# Patient Record
Sex: Male | Born: 1970 | Race: Black or African American | Hispanic: No | Marital: Single | Smoking: Current every day smoker
Health system: Southern US, Community
[De-identification: ages and names within clinical notes are randomized; demographics above are authoritative.]

## PROBLEM LIST (undated history)

## (undated) DIAGNOSIS — N189 Chronic kidney disease, unspecified: Secondary | ICD-10-CM

## (undated) DIAGNOSIS — E119 Type 2 diabetes mellitus without complications: Secondary | ICD-10-CM

## (undated) DIAGNOSIS — G709 Myoneural disorder, unspecified: Secondary | ICD-10-CM

## (undated) DIAGNOSIS — F209 Schizophrenia, unspecified: Secondary | ICD-10-CM

---

## 2019-12-26 ENCOUNTER — Ambulatory Visit: Payer: Self-pay | Attending: Critical Care Medicine

## 2019-12-26 DIAGNOSIS — Z23 Encounter for immunization: Secondary | ICD-10-CM

## 2019-12-26 NOTE — Progress Notes (Signed)
   Covid-19 Vaccination Clinic  Name:  Daniel Foley    MRN: [id number] DOB: [birth year]  12/26/2019  [name] was observed post Covid-19 immunization for 15 minutes without incident. He was provided with Vaccine Information Sheet and instruction to access the V-Safe system.   [name] was instructed to call 911 with any severe reactions post vaccine: Marland Kitchen Difficulty breathing  . Swelling of face and throat  . A fast heartbeat  . A bad rash all over body  . Dizziness and weakness   Immunizations Administered    Name Date Dose VIS Date Route   Moderna COVID-19 Vaccine 12/26/2019 11:02 AM 0.5 mL 10/26/2019 Intramuscular   Manufacturer: Moderna   Lot: [id number]   NDC: 17915-056-97

## 2020-01-26 ENCOUNTER — Ambulatory Visit: Payer: Self-pay | Attending: Internal Medicine

## 2020-01-26 DIAGNOSIS — Z23 Encounter for immunization: Secondary | ICD-10-CM

## 2020-01-26 NOTE — Progress Notes (Signed)
   Covid-19 Vaccination Clinic  Name:  Dvid Pendry    MRN: [id number] DOB: 05-05-1970  01/26/2020  Mr. Coston was observed post Covid-19 immunization for 15 minutes without incident. He was provided with Vaccine Information Sheet and instruction to access the V-Safe system.   Mr. Alen was instructed to call 911 with any severe reactions post vaccine: Marland Kitchen Difficulty breathing  . Swelling of face and throat  . A fast heartbeat  . A bad rash all over body  . Dizziness and weakness   Immunizations Administered    Name Date Dose VIS Date Route   Moderna COVID-19 Vaccine 01/26/2020 10:56 AM 0.5 mL 10/26/2019 Intramuscular   Manufacturer: Moderna   Lot: [id number]   NDC: 87681-157-26

## 2020-06-21 ENCOUNTER — Inpatient Hospital Stay (HOSPITAL_COMMUNITY)
Admission: EM | Admit: 2020-06-21 | Discharge: 2020-06-24 | DRG: 101 | Disposition: A | Discharge status: Home / Self Care | Payer: Medicaid Other | Attending: Internal Medicine | Admitting: Internal Medicine

## 2020-06-21 ENCOUNTER — Emergency Department (HOSPITAL_COMMUNITY): Payer: Medicaid Other

## 2020-06-21 ENCOUNTER — Encounter (HOSPITAL_COMMUNITY): Payer: Self-pay | Admitting: Neurology

## 2020-06-21 DIAGNOSIS — Z9114 Patient's other noncompliance with medication regimen: Secondary | ICD-10-CM

## 2020-06-21 DIAGNOSIS — R471 Dysarthria and anarthria: Secondary | ICD-10-CM | POA: Diagnosis present

## 2020-06-21 DIAGNOSIS — Z79899 Other long term (current) drug therapy: Secondary | ICD-10-CM

## 2020-06-21 DIAGNOSIS — F17210 Nicotine dependence, cigarettes, uncomplicated: Secondary | ICD-10-CM | POA: Diagnosis present

## 2020-06-21 DIAGNOSIS — Z91041 Radiographic dye allergy status: Secondary | ICD-10-CM

## 2020-06-21 DIAGNOSIS — F209 Schizophrenia, unspecified: Secondary | ICD-10-CM | POA: Diagnosis present

## 2020-06-21 DIAGNOSIS — R001 Bradycardia, unspecified: Secondary | ICD-10-CM | POA: Diagnosis present

## 2020-06-21 DIAGNOSIS — I10 Essential (primary) hypertension: Secondary | ICD-10-CM | POA: Diagnosis present

## 2020-06-21 DIAGNOSIS — R569 Unspecified convulsions: Secondary | ICD-10-CM | POA: Diagnosis not present

## 2020-06-21 DIAGNOSIS — Z7951 Long term (current) use of inhaled steroids: Secondary | ICD-10-CM

## 2020-06-21 DIAGNOSIS — Z20822 Contact with and (suspected) exposure to covid-19: Secondary | ICD-10-CM | POA: Diagnosis present

## 2020-06-21 DIAGNOSIS — E119 Type 2 diabetes mellitus without complications: Secondary | ICD-10-CM | POA: Diagnosis present

## 2020-06-21 DIAGNOSIS — R251 Tremor, unspecified: Secondary | ICD-10-CM | POA: Diagnosis present

## 2020-06-21 HISTORY — DX: Unspecified convulsions: R56.9

## 2020-06-21 HISTORY — DX: Schizophrenia, unspecified: F20.9

## 2020-06-21 LAB — CBG MONITORING, ED: Glucose-Capillary: 99 mg/dL (ref 70–99)

## 2020-06-21 LAB — CBC WITH DIFFERENTIAL/PLATELET
Abs Immature Granulocytes: 0.02 10*3/uL (ref 0.00–0.07)
Basophils Absolute: 0 10*3/uL (ref 0.0–0.1)
Basophils Relative: 0 %
Eosinophils Absolute: 0.1 10*3/uL (ref 0.0–0.5)
Eosinophils Relative: 1 %
HCT: 43.2 % (ref 39.0–52.0)
Hemoglobin: 14.1 g/dL (ref 13.0–17.0)
Immature Granulocytes: 0 %
Lymphocytes Relative: 51 %
Lymphs Abs: 3.9 10*3/uL (ref 0.7–4.0)
MCH: 29.7 pg (ref 26.0–34.0)
MCHC: 32.6 g/dL (ref 30.0–36.0)
MCV: 90.9 fL (ref 80.0–100.0)
Monocytes Absolute: 0.4 10*3/uL (ref 0.1–1.0)
Monocytes Relative: 6 %
Neutro Abs: 3.2 10*3/uL (ref 1.7–7.7)
Neutrophils Relative %: 42 %
Platelets: 209 10*3/uL (ref 150–400)
RBC: 4.75 MIL/uL (ref 4.22–5.81)
RDW: 13.6 % (ref 11.5–15.5)
WBC: 7.6 10*3/uL (ref 4.0–10.5)
nRBC: 0 % (ref 0.0–0.2)

## 2020-06-21 LAB — RAPID URINE DRUG SCREEN, HOSP PERFORMED
Amphetamines: NOT DETECTED
Barbiturates: NOT DETECTED
Benzodiazepines: NOT DETECTED
Cocaine: NOT DETECTED
Opiates: NOT DETECTED
Tetrahydrocannabinol: NOT DETECTED

## 2020-06-21 LAB — COMPREHENSIVE METABOLIC PANEL
ALT: 7 U/L (ref 0–44)
AST: 13 U/L — ABNORMAL LOW (ref 15–41)
Albumin: 3.5 g/dL (ref 3.5–5.0)
Alkaline Phosphatase: 45 U/L (ref 38–126)
Anion gap: 5 (ref 5–15)
BUN: 9 mg/dL (ref 6–20)
CO2: 30 mmol/L (ref 22–32)
Calcium: 9.5 mg/dL (ref 8.9–10.3)
Chloride: 104 mmol/L (ref 98–111)
Creatinine, Ser: 1.18 mg/dL (ref 0.61–1.24)
GFR, Estimated: >60 mL/min (ref >60)
Glucose, Bld: 80 mg/dL (ref 70–99)
Potassium: 4.3 mmol/L (ref 3.5–5.1)
Sodium: 139 mmol/L (ref 135–145)
Total Bilirubin: 0.5 mg/dL (ref 0.3–1.2)
Total Protein: 7.4 g/dL (ref 6.5–8.1)

## 2020-06-21 LAB — ETHANOL: Alcohol, Ethyl (B): <10 mg/dL (ref <10)

## 2020-06-21 MED ORDER — LORAZEPAM 2 MG/ML IJ SOLN: 2.0000 mg | Freq: Four times a day (QID) | INTRAMUSCULAR | Status: DC | PRN

## 2020-06-21 MED ORDER — GADOBUTROL 1 MMOL/ML IV SOLN
8.0000 mL | Freq: Once | INTRAVENOUS | Status: AC | PRN
  Administered 2020-06-21: 8 mL via INTRAVENOUS

## 2020-06-21 MED ORDER — MELATONIN 3 MG PO TABS
3.0000 mg | ORAL_TABLET | Freq: Every evening | ORAL | Status: DC | PRN
  Administered 2020-06-22 – 2020-06-23 (×2): 3 mg via ORAL
  Filled 2020-06-21 (×2): qty 1

## 2020-06-21 MED ORDER — ENOXAPARIN SODIUM 40 MG/0.4ML IJ SOSY
40.0000 mg | PREFILLED_SYRINGE | INTRAMUSCULAR | Status: DC
  Administered 2020-06-21 – 2020-06-23 (×3): 40 mg via SUBCUTANEOUS
  Filled 2020-06-21 (×3): qty 0.4

## 2020-06-21 MED ORDER — LACTATED RINGERS IV SOLN: INTRAVENOUS | Status: AC

## 2020-06-21 MED ORDER — ACETAMINOPHEN 325 MG PO TABS: 650.0000 mg | ORAL_TABLET | Freq: Four times a day (QID) | ORAL | Status: DC | PRN

## 2020-06-21 MED ORDER — DIPHENHYDRAMINE HCL 50 MG/ML IJ SOLN
50.0000 mg | Freq: Once | INTRAMUSCULAR | Status: AC
  Administered 2020-06-21: 50 mg via INTRAVENOUS
  Filled 2020-06-21: qty 1

## 2020-06-21 MED ORDER — POLYETHYLENE GLYCOL 3350 17 G PO PACK: 17.0000 g | PACK | Freq: Every day | ORAL | Status: DC | PRN

## 2020-06-21 MED ORDER — METHYLPREDNISOLONE SODIUM SUCC 125 MG IJ SOLR
125.0000 mg | Freq: Once | INTRAMUSCULAR | Status: AC
  Administered 2020-06-21: 125 mg via INTRAVENOUS
  Filled 2020-06-21: qty 2

## 2020-06-21 MED ORDER — ONDANSETRON HCL 4 MG/2ML IJ SOLN: 4.0000 mg | Freq: Four times a day (QID) | INTRAMUSCULAR | Status: DC | PRN

## 2020-06-21 MED ORDER — HYDRALAZINE HCL 20 MG/ML IJ SOLN: 5.0000 mg | Freq: Four times a day (QID) | INTRAMUSCULAR | Status: DC | PRN

## 2020-06-21 NOTE — ED Notes (Signed)
Viviann Spare (Supervisor carehome) [phone] Pt is a resident of [facility] group home.

## 2020-06-21 NOTE — ED Notes (Signed)
Patient transported to MRI 

## 2020-06-21 NOTE — ED Provider Notes (Signed)
[facility] EMERGENCY DEPARTMENT Provider Note   CSN: [id number] Arrival date & time: 06/21/20  1341     History Chief Complaint  Patient presents with   Seizures    Kagen Kunath is a 50 y.o. male.  HPI  50 year old male presents the emergency department today for evaluation of seizure-like activity.  Level 5 caveat as patient is unable to provide any reliable history about the potential seizure like activity. He does not remember this episode. He denies any complaints at this time. EMS reports that patient had a 1 to 2-minute episode of generalized shaking at his group home that was witnessed.  He was little bit uncooperative in route.  2:31 PM spoke with Harvie Heck who was present with the patient at the group home.  He states that the patient was outside smoking out of a pipe which is not normal for him.  Normally he smokes.  Rolled cigarettes.  He came back inside, laid down on the couch and then he was told that the patient was seizing.  He witnessed the patient having generalized shaking and that his eyes rolled in the back of his head.  He was also biting his tongue.  This lasted for about 1 to 2 minutes and upon resolution of symptoms the patient was confused for about 20 minutes.  He states that he has no known history of seizures and does not think the patient takes medication for seizures.  He states that at baseline the patient is oriented to self but not necessarily oriented to date, place or situation.  Past Medical History:  Diagnosis Date   Schizophrenia [facility])     Patient Active Problem List   Diagnosis Date Noted   Witnessed seizure-like activity (HCC) 06/21/2020     History reviewed. No pertinent surgical history.     History reviewed. No pertinent family history.  Social History   Tobacco Use   Smoking status: Every Day    Pack years: 0.00    Types: Cigarettes   Smokeless tobacco: Never  Substance Use Topics   Alcohol use: Not Currently    Drug use: Not Currently    Home Medications Prior to Admission medications   Not on File    Allergies    Patient has no allergy information on record.  Review of Systems   Review of Systems  Constitutional:  Negative for chills and fever.  HENT:  Negative for ear pain and sore throat.   Eyes:  Negative for pain and visual disturbance.  Respiratory:  Negative for cough and shortness of breath.   Cardiovascular:  Negative for chest pain.  Gastrointestinal:  Negative for abdominal pain, constipation, diarrhea, nausea and vomiting.  Genitourinary:  Negative for dysuria and hematuria.  Musculoskeletal:  Negative for back pain.  Skin:  Negative for color change and rash.  Neurological:  Positive for seizures. Negative for syncope.  All other systems reviewed and are negative.  Physical Exam Updated Vital Signs BP (!) 142/102   Pulse (!) 49   Temp 98.1 F (36.7 C)   Resp 15   SpO2 98%   Physical Exam Vitals and nursing note reviewed.  Constitutional:      Appearance: He is well-developed.  HENT:     Head: Normocephalic and atraumatic.  Eyes:     Conjunctiva/sclera: Conjunctivae normal.  Cardiovascular:     Rate and Rhythm: Normal rate and regular rhythm.     Heart sounds: Normal heart sounds. No murmur heard. Pulmonary:  Effort: Pulmonary effort is normal. No respiratory distress.     Breath sounds: Normal breath sounds. No wheezing, rhonchi or rales.  Abdominal:     General: Bowel sounds are normal.     Palpations: Abdomen is soft.     Tenderness: There is no abdominal tenderness. There is no guarding or rebound.  Musculoskeletal:     Cervical back: Neck supple.  Skin:    General: Skin is warm and dry.  Neurological:     Mental Status: He is alert.     Comments: Mental Status:  Alert, oriented to self. Not oriented to place, situation. Able to follow 2 step commands without difficulty.  Cranial Nerves:  II:  Peripheral visual fields grossly normal, pupils  equal, round, reactive to light III,IV, VI: ptosis not present, extra-ocular motions intact bilaterally  V,VII: smile symmetric, facial light touch sensation equal VIII: hearing grossly normal to voice  X: uvula elevates symmetrically  XI: bilateral shoulder shrug symmetric and strong XII: midline tongue extension without fassiculations Motor:  Normal tone. 5/5 strength of BUE and BLE major muscle groups including strong and equal grip strength and dorsiflexion/plantar flexion Sensory: light touch normal in all extremities.      ED Results / Procedures / Treatments   Labs (all labs ordered are listed, but only abnormal results are displayed) Labs Reviewed  COMPREHENSIVE METABOLIC PANEL - Abnormal; Notable for the following components:      Result Value   AST 13 (*)    All other components within normal limits  RESP PANEL BY RT-PCR (FLU A&B, COVID) ARPGX2  CBC WITH DIFFERENTIAL/PLATELET  RAPID URINE DRUG SCREEN, HOSP PERFORMED  ETHANOL  COMPREHENSIVE METABOLIC PANEL  MAGNESIUM  CBG MONITORING, ED    EKG None  Radiology CT Head Wo Contrast  Result Date: 06/21/2020 CLINICAL DATA:  Seizures EXAM: CT HEAD WITHOUT CONTRAST TECHNIQUE: Contiguous axial images were obtained from the base of the skull through the vertex without intravenous contrast. COMPARISON:  None. FINDINGS: Brain: Normal anatomic configuration. Parenchymal volume loss is slightly advanced given the patient's age. Mild periventricular white matter changes are present likely reflecting the sequela of small vessel ischemia. No abnormal intra or extra-axial mass lesion or fluid collection. No abnormal mass effect or midline shift. No evidence of acute intracranial hemorrhage or infarct. Ventricular size is normal. Cerebellum unremarkable. Vascular: No asymmetric hyperdense vasculature at the skull base. Skull: Intact Sinuses/Orbits: Paranasal sinuses are clear. Orbits are unremarkable. Other: Mastoid air cells and middle  ear cavities are clear. IMPRESSION: No acute intracranial abnormality. Mild parenchymal atrophy, advanced in relation to the patient's given age. Electronically Signed   By: Helyn Numbers MD   On: 06/21/2020 15:21    Procedures Procedures   Medications Ordered in ED Medications - No data to display  ED Course  I have reviewed the triage vital signs and the nursing notes.  Pertinent labs & imaging results that were available during my care of the patient were reviewed by me and considered in my medical decision making (see chart for details).    MDM Rules/Calculators/A&P                          50 year old male presenting the emergency department today for evaluation of seizure-like to video.  No reported injuries.  No history of seizures.  Reviewed/interpreted labs CBG wnl CBC unremarkable CMP unremarkable Mag - pending on admission UDS neg ETOH neg  EKG with NSR, LVH  CT  head - no acute intracranial abnormality. Mild parenchymal atrophy, advanced in relation to the patient's given age.  7:17 PM CONSULT with Dr. Thomasena Edis of neurology who recommends mri w/ w/o contrast and admit for eeg for further w/u of new onset seizure  7:53 PM CONSULT with Dr. Margo Aye who accepts patient for admission   Final Clinical Impression(s) / ED Diagnoses Final diagnoses:  Seizure-like activity [facility])    Rx / DC Orders ED Discharge Orders     None        Rayne Du 06/21/20 1953    Koleen Distance, MD 06/22/20 [phone]

## 2020-06-21 NOTE — ED Triage Notes (Signed)
Pt to ED via EMS [facility] c/o seizures, Pt has no history of seizures, witnessed "seizure like activity" that lasted aprox 2 mins. Pt has no memory of event. This occurred after reportedly smoking tobacco products. HX DM, schizophrenia. Group home staff Harvie Heck: [phone] Last VS: 129/85, p 92, 96%RA, CBG 126. No medications given by EMS. Pt refused to allow EMS to obtain IV.

## 2020-06-21 NOTE — ED Notes (Signed)
Patient returns from MRI- tech advised that patient appears to have rash on face. Upon further inspection, patient is having an allergic reaction to contrast. Benadryl and solumedrol given at this time. Patient given food and resting comfortably.

## 2020-06-21 NOTE — ED Notes (Signed)
Pt transported to CT ?

## 2020-06-21 NOTE — H&P (Addendum)
History and Physical  Daniel Foley ZSW:[id number] DOB: [birth year] DOA: 06/21/2020  Referring physician: Leonia Corona, PA-EDP  PCP: Pcp, No  Outpatient Specialists: None Patient coming from: Group home.    Chief Complaint: Seizure-like activity.  HPI: Daniel Foley is a 50 y.o. male with medical history significant for type 2 diabetes, hypertension, schizophrenia, who presented to [facility] ED from group home where he has resided for 10 months due to seizure-like activity that started around noon on the day of presentation lasting about 20 minutes continuously per his caregiver Harvie Heck who witnessed this event.  This seizure like activity presented as "eye rolling back shaking all over the body, short breaths, trying to swallow his tongue."  No tongue biting, no urine or stool incontinence.  No prior history of seizures, no history of alcohol or illicit drug use.  Smokes cigarettes occasionally.  History is mainly obtained from the patient's caregiver Harvie Heck [phone].  Harvie Heck supervisor 's Jeannett Senior [phone].  EMS was activated and patient was brought to the ED for further evaluation and management.  Patient was seen by neurology, recommended MRI brain and EEG.  TRH, hospitalist team, was asked to admit.    At the time of this visit the patient is confused, per Harvie Heck, his caregiver, this is his baseline mentation in the setting of schizophrenia.  Patient was in his usual state of health prior to this.  He had his last PCPs checkup the day prior to this presentation.  No changes in his medications and vital signs were stable.  ED Course:  Temperature 99.1.  BP 146/125, pulse 49, respiration rate 15, O2 saturation 99% on room air.  Lab studies remarkable for serum sodium 139, potassium 4.3, serum bicarb 30, BUN 9, creatinine 1.18, GFR greater than 60.  Serum glucose 80.  CBC essentially unremarkable.  UDS unremarkable.  CT head nonacute.  Review of Systems: Review of systems as noted in the HPI. All  other systems reviewed and are negative.   Past Medical History:  Diagnosis Date   Schizophrenia [facility])    History reviewed. No pertinent surgical history.  Social History:  reports that he has been smoking cigarettes. He has never used smokeless tobacco. He reports previous alcohol use. He reports previous drug use.   Family history: Per the patient his younger brother has a history of seizures.  Prior to Admission medications   Not on File    Physical Exam: BP (!) 142/102   Pulse (!) 49   Temp 98.1 F (36.7 C)   Resp 15   SpO2 98%   General: 50 y.o. year-old male well developed well nourished in no acute distress.  Alert and confused. Cardiovascular: Regular rate and rhythm with no rubs or gallops.  No thyromegaly or JVD noted.  No lower extremity edema. 2/4 pulses in all 4 extremities. Respiratory: Clear to auscultation with no wheezes or rales. Good inspiratory effort. Abdomen: Soft nontender nondistended with normal bowel sounds x4 quadrants. Muskuloskeletal: No cyanosis, clubbing or edema noted bilaterally Neuro: CN II-XII intact, strength, sensation, reflexes Skin: No ulcerative lesions noted or rashes Psychiatry: Judgement and insight appear altered. Mood is appropriate for condition and setting          Labs on Admission:  Basic Metabolic Panel: Recent Labs  Lab 06/21/20 1710  NA 139  K 4.3  CL 104  CO2 30  GLUCOSE 80  BUN 9  CREATININE 1.18  CALCIUM 9.5   Liver Function Tests: Recent Labs  Lab 06/21/20 1710  AST  13*  ALT 7  ALKPHOS 45  BILITOT 0.5  PROT 7.4  ALBUMIN 3.5   No results for input(s): LIPASE, AMYLASE in the last 168 hours. No results for input(s): AMMONIA in the last 168 hours. CBC: Recent Labs  Lab 06/21/20 1421  WBC 7.6  NEUTROABS 3.2  HGB 14.1  HCT 43.2  MCV 90.9  PLT 209   Cardiac Enzymes: No results for input(s): CKTOTAL, CKMB, CKMBINDEX, TROPONINI in the last 168 hours.  BNP (last 3 results) No results for  input(s): BNP in the last 8760 hours.  ProBNP (last 3 results) No results for input(s): PROBNP in the last 8760 hours.  CBG: Recent Labs  Lab 06/21/20 1435  GLUCAP 99    Radiological Exams on Admission: CT Head Wo Contrast  Result Date: 06/21/2020 CLINICAL DATA:  Seizures EXAM: CT HEAD WITHOUT CONTRAST TECHNIQUE: Contiguous axial images were obtained from the base of the skull through the vertex without intravenous contrast. COMPARISON:  None. FINDINGS: Brain: Normal anatomic configuration. Parenchymal volume loss is slightly advanced given the patient's age. Mild periventricular white matter changes are present likely reflecting the sequela of small vessel ischemia. No abnormal intra or extra-axial mass lesion or fluid collection. No abnormal mass effect or midline shift. No evidence of acute intracranial hemorrhage or infarct. Ventricular size is normal. Cerebellum unremarkable. Vascular: No asymmetric hyperdense vasculature at the skull base. Skull: Intact Sinuses/Orbits: Paranasal sinuses are clear. Orbits are unremarkable. Other: Mastoid air cells and middle ear cavities are clear. IMPRESSION: No acute intracranial abnormality. Mild parenchymal atrophy, advanced in relation to the patient's given age. Electronically Signed   By: Helyn Numbers MD   On: 06/21/2020 15:21    EKG: I independently viewed the EKG done and my findings are as followed: Sinus rhythm rate of 77.  Nonspecific ST-T changes.  QTc 391.  Assessment/Plan Present on Admission: **None**  Active Problems:   Witnessed seizure-like activity (HCC)   Seizure (HCC)  Seizure-like activity, rule out seizure disorder. Presented from group home after being witnessed having 2 seizure-like activities, shaking uncontrollably, eye rolling back, try to swallow his tongue, lasting 20 minutes. No incontinence of urine or stool, or tongue biting. CT head nonacute. UDS unremarkable. Seen by neurology, recommendation for MRI brain  and EEG, pending, follow results. Monitor on telemetry. Seizures precautions IV Ativan as needed for seizures breakthrough.  Schizophrenia Per his caregiver Harvie Heck he takes medication for schizophrenia At baseline he is confused, per his caregiver. No home medication records at the time of this dictation Please reconcile medications and restart home medications once they are made available by pharmacy.  Hypertension BP is not at goal, elevated. Restart home medications once home medications have been reconciled. IV antihypertensive as needed with parameters for now Continue to closely monitor vital signs.  Transient bradycardia Heart rate 49-51. Avoid AV nodal blockade agents IV hydralazine as needed with parameters for hypertension Monitor on telemetry   DVT prophylaxis: Subcu Lovenox daily.  Code Status: Full code.  Family Communication: Updated his caregiver Harvie Heck and Jeannett Senior via phone.  Patient has a sister, caregiver and supervisor did not have her phone number available at time of these phone calls.  Supervisor Jeannett Senior at [phone].  Disposition Plan: Admit to telemetry medical.  Consults called: Neurology consulted by EDP.  Admission status: Observation status.   Status is: Observation    Dispo:  Patient From: Group Home  Planned Disposition: Group Home possibly on 06/22/2020.  Medically stable for discharge: No  Darlin Drop MD [facility] Pager [phone]  If 7PM-7AM, please contact night-coverage [email or web address] Password [facility]  06/21/2020, 8:07 PM

## 2020-06-22 ENCOUNTER — Other Ambulatory Visit: Payer: Self-pay

## 2020-06-22 ENCOUNTER — Inpatient Hospital Stay (HOSPITAL_COMMUNITY): Payer: Medicaid Other

## 2020-06-22 DIAGNOSIS — F17210 Nicotine dependence, cigarettes, uncomplicated: Secondary | ICD-10-CM | POA: Diagnosis present

## 2020-06-22 DIAGNOSIS — Z79899 Other long term (current) drug therapy: Secondary | ICD-10-CM | POA: Diagnosis not present

## 2020-06-22 DIAGNOSIS — Z91041 Radiographic dye allergy status: Secondary | ICD-10-CM | POA: Diagnosis not present

## 2020-06-22 DIAGNOSIS — F209 Schizophrenia, unspecified: Secondary | ICD-10-CM | POA: Diagnosis present

## 2020-06-22 DIAGNOSIS — Z9114 Patient's other noncompliance with medication regimen: Secondary | ICD-10-CM | POA: Diagnosis not present

## 2020-06-22 DIAGNOSIS — R001 Bradycardia, unspecified: Secondary | ICD-10-CM | POA: Diagnosis present

## 2020-06-22 DIAGNOSIS — R569 Unspecified convulsions: Secondary | ICD-10-CM

## 2020-06-22 DIAGNOSIS — I10 Essential (primary) hypertension: Secondary | ICD-10-CM | POA: Diagnosis present

## 2020-06-22 DIAGNOSIS — Z7951 Long term (current) use of inhaled steroids: Secondary | ICD-10-CM | POA: Diagnosis not present

## 2020-06-22 DIAGNOSIS — Z20822 Contact with and (suspected) exposure to covid-19: Secondary | ICD-10-CM | POA: Diagnosis present

## 2020-06-22 DIAGNOSIS — R471 Dysarthria and anarthria: Secondary | ICD-10-CM | POA: Diagnosis present

## 2020-06-22 DIAGNOSIS — E119 Type 2 diabetes mellitus without complications: Secondary | ICD-10-CM | POA: Diagnosis present

## 2020-06-22 DIAGNOSIS — R251 Tremor, unspecified: Secondary | ICD-10-CM | POA: Diagnosis present

## 2020-06-22 HISTORY — DX: Unspecified convulsions: R56.9

## 2020-06-22 LAB — URINALYSIS, ROUTINE W REFLEX MICROSCOPIC
Bacteria, UA: NONE SEEN
Bilirubin Urine: NEGATIVE
Glucose, UA: >=500 mg/dL — AB
Hgb urine dipstick: NEGATIVE
Ketones, ur: NEGATIVE mg/dL
Leukocytes,Ua: NEGATIVE
Nitrite: NEGATIVE
Protein, ur: NEGATIVE mg/dL
Specific Gravity, Urine: 1.028 (ref 1.005–1.030)
pH: 6 (ref 5.0–8.0)

## 2020-06-22 LAB — PHOSPHORUS: Phosphorus: 2 mg/dL — ABNORMAL LOW (ref 2.5–4.6)

## 2020-06-22 LAB — CBC
HCT: 43.8 % (ref 39.0–52.0)
Hemoglobin: 14.4 g/dL (ref 13.0–17.0)
MCH: 29.6 pg (ref 26.0–34.0)
MCHC: 32.9 g/dL (ref 30.0–36.0)
MCV: 89.9 fL (ref 80.0–100.0)
Platelets: 296 10*3/uL (ref 150–400)
RBC: 4.87 MIL/uL (ref 4.22–5.81)
RDW: 13.6 % (ref 11.5–15.5)
WBC: 8.4 10*3/uL (ref 4.0–10.5)
nRBC: 0 % (ref 0.0–0.2)

## 2020-06-22 LAB — RESP PANEL BY RT-PCR (FLU A&B, COVID) ARPGX2
Influenza A by PCR: NEGATIVE
Influenza B by PCR: NEGATIVE
SARS Coronavirus 2 by RT PCR: NEGATIVE

## 2020-06-22 LAB — COMPREHENSIVE METABOLIC PANEL
ALT: 13 U/L (ref 0–44)
AST: 18 U/L (ref 15–41)
Albumin: 3.5 g/dL (ref 3.5–5.0)
Alkaline Phosphatase: 43 U/L (ref 38–126)
Anion gap: 8 (ref 5–15)
BUN: 15 mg/dL (ref 6–20)
CO2: 21 mmol/L — ABNORMAL LOW (ref 22–32)
Calcium: 9.8 mg/dL (ref 8.9–10.3)
Chloride: 105 mmol/L (ref 98–111)
Creatinine, Ser: 1.02 mg/dL (ref 0.61–1.24)
GFR, Estimated: >60 mL/min (ref >60)
Glucose, Bld: 131 mg/dL — ABNORMAL HIGH (ref 70–99)
Potassium: 5.8 mmol/L — ABNORMAL HIGH (ref 3.5–5.1)
Sodium: 134 mmol/L — ABNORMAL LOW (ref 135–145)
Total Bilirubin: 0.9 mg/dL (ref 0.3–1.2)
Total Protein: 7.3 g/dL (ref 6.5–8.1)

## 2020-06-22 LAB — HIV ANTIBODY (ROUTINE TESTING W REFLEX): HIV Screen 4th Generation wRfx: NONREACTIVE

## 2020-06-22 LAB — MAGNESIUM: Magnesium: 1.9 mg/dL (ref 1.7–2.4)

## 2020-06-22 LAB — MRSA PCR SCREENING: MRSA by PCR: NEGATIVE

## 2020-06-22 LAB — GLUCOSE, CAPILLARY: Glucose-Capillary: 191 mg/dL — ABNORMAL HIGH (ref 70–99)

## 2020-06-22 LAB — VALPROIC ACID LEVEL: Valproic Acid Lvl: 31 ug/mL — ABNORMAL LOW (ref 50.0–100.0)

## 2020-06-22 MED ORDER — LEVETIRACETAM 500 MG PO TABS
500.0000 mg | ORAL_TABLET | Freq: Two times a day (BID) | ORAL | Status: DC
  Administered 2020-06-22 – 2020-06-24 (×5): 500 mg via ORAL
  Filled 2020-06-22 (×6): qty 1

## 2020-06-22 MED ORDER — HALOPERIDOL 5 MG PO TABS
5.0000 mg | ORAL_TABLET | Freq: Every day | ORAL | Status: DC
  Administered 2020-06-22 – 2020-06-24 (×3): 5 mg via ORAL
  Filled 2020-06-22 (×3): qty 1

## 2020-06-22 MED ORDER — MOMETASONE FURO-FORMOTEROL FUM 200-5 MCG/ACT IN AERO
2.0000 | INHALATION_SPRAY | Freq: Two times a day (BID) | RESPIRATORY_TRACT | Status: DC
  Administered 2020-06-22 – 2020-06-24 (×5): 2 via RESPIRATORY_TRACT
  Filled 2020-06-22: qty 8.8

## 2020-06-22 MED ORDER — DAPAGLIFLOZIN PROPANEDIOL 10 MG PO TABS
10.0000 mg | ORAL_TABLET | Freq: Every day | ORAL | Status: DC
  Administered 2020-06-22 – 2020-06-24 (×3): 10 mg via ORAL
  Filled 2020-06-22 (×3): qty 1

## 2020-06-22 MED ORDER — NICOTINE 14 MG/24HR TD PT24
14.0000 mg | MEDICATED_PATCH | Freq: Every day | TRANSDERMAL | Status: DC
  Administered 2020-06-22 – 2020-06-24 (×3): 14 mg via TRANSDERMAL
  Filled 2020-06-22 (×3): qty 1

## 2020-06-22 MED ORDER — DIVALPROEX SODIUM 250 MG PO DR TAB
1000.0000 mg | DELAYED_RELEASE_TABLET | Freq: Two times a day (BID) | ORAL | Status: DC
  Administered 2020-06-22 – 2020-06-24 (×5): 1000 mg via ORAL
  Filled 2020-06-22 (×5): qty 4

## 2020-06-22 MED ORDER — LORAZEPAM 2 MG/ML IJ SOLN
2.0000 mg | INTRAMUSCULAR | Status: DC | PRN
  Administered 2020-06-22: 1 mg via INTRAVENOUS

## 2020-06-22 MED ORDER — UMECLIDINIUM BROMIDE 62.5 MCG/INH IN AEPB
1.0000 | INHALATION_SPRAY | Freq: Every day | RESPIRATORY_TRACT | Status: DC
  Administered 2020-06-23 – 2020-06-24 (×2): 1 via RESPIRATORY_TRACT
  Filled 2020-06-22: qty 7

## 2020-06-22 MED ORDER — LACOSAMIDE 50 MG PO TABS
150.0000 mg | ORAL_TABLET | Freq: Two times a day (BID) | ORAL | Status: DC
  Administered 2020-06-22 – 2020-06-24 (×5): 150 mg via ORAL
  Filled 2020-06-22 (×5): qty 3

## 2020-06-22 MED ORDER — FINERENONE 10 MG PO TABS: 10.0000 mg | ORAL_TABLET | Freq: Every day | ORAL | Status: DC

## 2020-06-22 MED ORDER — DEUTETRABENAZINE 6 MG PO TABS
6.0000 mg | ORAL_TABLET | Freq: Two times a day (BID) | ORAL | Status: DC
  Administered 2020-06-23: 6 mg via ORAL

## 2020-06-22 MED ORDER — ALBUTEROL SULFATE (2.5 MG/3ML) 0.083% IN NEBU: 2.5000 mg | INHALATION_SOLUTION | RESPIRATORY_TRACT | Status: DC | PRN

## 2020-06-22 MED ORDER — LORAZEPAM 2 MG/ML IJ SOLN
INTRAMUSCULAR | Status: AC
  Filled 2020-06-22: qty 1

## 2020-06-22 MED ORDER — ROSUVASTATIN CALCIUM 20 MG PO TABS
20.0000 mg | ORAL_TABLET | Freq: Every day | ORAL | Status: DC
  Administered 2020-06-22 – 2020-06-24 (×3): 20 mg via ORAL
  Filled 2020-06-22 (×3): qty 1

## 2020-06-22 MED ORDER — BENZTROPINE MESYLATE 1 MG PO TABS
1.0000 mg | ORAL_TABLET | Freq: Every day | ORAL | Status: DC
  Administered 2020-06-22 – 2020-06-24 (×3): 1 mg via ORAL
  Filled 2020-06-22 (×3): qty 1

## 2020-06-22 MED ORDER — SEMAGLUTIDE 7 MG PO TABS: 7.0000 mg | ORAL_TABLET | Freq: Every day | ORAL | Status: DC

## 2020-06-22 NOTE — Progress Notes (Signed)
Pt laying in bed with head covered. Pt with some confusion. CM attempted to reach out to his caregiver Harvie Heck from group home without success. Message left.  TOC following.

## 2020-06-22 NOTE — Progress Notes (Signed)
[facility] PROGRESS NOTE    Progress Note  Daniel Foley  BWG:[id number] DOB: December 04, 1970 DOA: 06/21/2020 PCP: Oneita Hurt, No     Brief Narrative:   Daniel Foley is an 50 y.o. male past medical history significant for type 2 diabetes mellitus, essential hypertension schizophrenia comes into the ED for from a group home for seizure-like activity that started at noon on the day of admission, according to staff and lasted about 20 minutes continuously no prior history of seizures no biting of the tongue no loss of sphincter control no use of illicit drugs or alcohol.  Neurology was consulted by the ED physician who recommended MRI and EEG.  Temperature of 99 vital stable, sodium 139 bicarb 30 glucose of 80 CT of the head unremarkable UDS negative.  She has no history of seizures but she is on 2 seizure medications Vimpat and Depakote     Assessment/Plan:   Witnessed seizure-like activity (HCC) No loss of sphincter control, CT of the head was negative, UDS was unremarkable. Neurology was consulted recommended MRI and EEG which are pending at this point in time. According to the review of the chart she is on 3 antiepileptic drugs Vimpat, Keppra and Depakote we will continue these. Question if she needs any of her medications. I went into the room and he was having tremors with shaking of the head no loss of control sphincters, was responding to Gosai stimuli. He was given 2 mg of Ativan.  I have instructed the nurse to give him his oral seizure medications stat. I tried to call to his group home several times and was unsuccessful  Schizophrenia: Resume current home regimen.  Essential hypertension: Blood pressure not at goal resume home regimen.  Transient bradycardia: With a heart rate of 49 sinus no other events on telemetry.   DVT prophylaxis: lovenox Family Communication:none Status is: Observation  The patient remains OBS appropriate and will d/c before 2  midnights.  Dispo:  Patient From: Group Home  Planned Disposition: Group Home  Medically stable for discharge: No       Code Status:     Code Status Orders  (From admission, onward)           Start     Ordered   06/21/20 1956  Full code  Continuous        06/21/20 1956           Code Status History     This patient has a current code status but no historical code status.         IV Access:   Peripheral IV   Procedures and diagnostic studies:   CT Head Wo Contrast  Result Date: 06/21/2020 CLINICAL DATA:  Seizures EXAM: CT HEAD WITHOUT CONTRAST TECHNIQUE: Contiguous axial images were obtained from the base of the skull through the vertex without intravenous contrast. COMPARISON:  None. FINDINGS: Brain: Normal anatomic configuration. Parenchymal volume loss is slightly advanced given the patient's age. Mild periventricular white matter changes are present likely reflecting the sequela of small vessel ischemia. No abnormal intra or extra-axial mass lesion or fluid collection. No abnormal mass effect or midline shift. No evidence of acute intracranial hemorrhage or infarct. Ventricular size is normal. Cerebellum unremarkable. Vascular: No asymmetric hyperdense vasculature at the skull base. Skull: Intact Sinuses/Orbits: Paranasal sinuses are clear. Orbits are unremarkable. Other: Mastoid air cells and middle ear cavities are clear. IMPRESSION: No acute intracranial abnormality. Mild parenchymal atrophy, advanced in relation to the patient's given age. Electronically  Signed   By: Helyn Numbers MD   On: 06/21/2020 15:21   MR Brain W and Wo Contrast  Result Date: 06/21/2020 CLINICAL DATA:  Seizure EXAM: MRI HEAD WITHOUT AND WITH CONTRAST TECHNIQUE: Multiplanar, multiecho pulse sequences of the brain and surrounding structures were obtained without and with intravenous contrast. CONTRAST:  27mL GADAVIST GADOBUTROL 1 MMOL/ML IV SOLN COMPARISON:  None. FINDINGS: Brain: No  acute infarct, mass effect or extra-axial collection. No acute or chronic hemorrhage. Mild nonspecific periventricular white matter hyperintensity. The midline structures are normal. There is no abnormal contrast enhancement. Vascular: Major flow voids are preserved. Skull and upper cervical spine: Normal calvarium and skull base. Visualized upper cervical spine and soft tissues are normal. Sinuses/Orbits:No paranasal sinus fluid levels or advanced mucosal thickening. No mastoid or middle ear effusion. Normal orbits. IMPRESSION: 1. No acute intracranial abnormality. 2. Mild nonspecific periventricular white matter hyperintensity. Electronically Signed   By: Deatra Robinson M.D.   On: 06/21/2020 21:35     Medical Consultants:   None.   Subjective:    Daniel Foley nonverbal  Objective:    Vitals:   06/21/20 2245 06/21/20 2256 06/22/20 0019 06/22/20 0416  BP: 115/79  120/84 99/70  Pulse: 62  67 66  Resp: 12  15 19   Temp:  98.3 F (36.8 C) 97.8 F (36.6 C) 98 F (36.7 C)  TempSrc:  Oral Oral Oral  SpO2: 96%  97% 98%  Weight:   87.9 kg   Height:   6\' 4"  (1.93 m)    SpO2: 98 %   Intake/Output Summary (Last 24 hours) at 06/22/2020 0738 Last data filed at 06/22/2020 0418 Gross per 24 hour  Intake --  Output 1150 ml  Net -1150 ml   Filed Weights   06/22/20 0019  Weight: 87.9 kg    Exam: General exam: In no acute distress. Respiratory system: Good air movement and clear to auscultation. Cardiovascular system: S1 & S2 heard, RRR. No JVD.  Gastrointestinal system: Abdomen is nondistended, soft and nontender.  Central nervous system: Nonverbal responding to severe noci stimuli, when I went in the room he was having uncontrollable movement of all 4 extremities. Extremities: No pedal edema. Skin: No rashes, lesions or ulcers    Data Reviewed:    Labs: Basic Metabolic Panel: Recent Labs  Lab 06/21/20 1710 06/22/20 0349  NA 139 134*  K 4.3 5.8*  CL 104 105  CO2 30 21*   GLUCOSE 80 131*  BUN 9 15  CREATININE 1.18 1.02  CALCIUM 9.5 9.8  MG  --  1.9  PHOS  --  2.0*   GFR Estimated Creatinine Clearance: 106.4 mL/min (by C-G formula based on SCr of 1.02 mg/dL). Liver Function Tests: Recent Labs  Lab 06/21/20 1710 06/22/20 0349  AST 13* 18  ALT 7 13  ALKPHOS 45 43  BILITOT 0.5 0.9  PROT 7.4 7.3  ALBUMIN 3.5 3.5   No results for input(s): LIPASE, AMYLASE in the last 168 hours. No results for input(s): AMMONIA in the last 168 hours. Coagulation profile No results for input(s): INR, PROTIME in the last 168 hours. COVID-19 Labs  No results for input(s): DDIMER, FERRITIN, LDH, CRP in the last 72 hours.  Lab Results  Component Value Date   SARSCOV2NAA NEGATIVE 06/21/2020    CBC: Recent Labs  Lab 06/21/20 1421 06/22/20 0349  WBC 7.6 8.4  NEUTROABS 3.2  --   HGB 14.1 14.4  HCT 43.2 43.8  MCV 90.9 89.9  PLT  209 296   Cardiac Enzymes: No results for input(s): CKTOTAL, CKMB, CKMBINDEX, TROPONINI in the last 168 hours. BNP (last 3 results) No results for input(s): PROBNP in the last 8760 hours. CBG: Recent Labs  Lab 06/21/20 1435 06/22/20 0609  GLUCAP 99 191*   D-Dimer: No results for input(s): DDIMER in the last 72 hours. Hgb A1c: No results for input(s): HGBA1C in the last 72 hours. Lipid Profile: No results for input(s): CHOL, HDL, LDLCALC, TRIG, CHOLHDL, LDLDIRECT in the last 72 hours. Thyroid function studies: No results for input(s): TSH, T4TOTAL, T3FREE, THYROIDAB in the last 72 hours.  Invalid input(s): FREET3 Anemia work up: No results for input(s): VITAMINB12, FOLATE, FERRITIN, TIBC, IRON, RETICCTPCT in the last 72 hours. Sepsis Labs: Recent Labs  Lab 06/21/20 1421 06/22/20 0349  WBC 7.6 8.4   Microbiology Recent Results (from the past 240 hour(s))  Resp Panel by RT-PCR (Flu A&B, Covid) Nasopharyngeal Swab     Status: None   Collection Time: 06/21/20 10:38 PM   Specimen: Nasopharyngeal Swab;  Nasopharyngeal(NP) swabs in vial transport medium  Result Value Ref Range Status   SARS Coronavirus 2 by RT PCR NEGATIVE NEGATIVE Final    Comment: (NOTE) SARS-CoV-2 target nucleic acids are NOT DETECTED.  The SARS-CoV-2 RNA is generally detectable in upper respiratory specimens during the acute phase of infection. The lowest concentration of SARS-CoV-2 viral copies this assay can detect is 138 copies/mL. A negative result does not preclude SARS-Cov-2 infection and should not be used as the sole basis for treatment or other patient management decisions. A negative result may occur with  improper specimen collection/handling, submission of specimen other than nasopharyngeal swab, presence of viral mutation(s) within the areas targeted by this assay, and inadequate number of viral copies(<138 copies/mL). A negative result must be combined with clinical observations, patient history, and epidemiological information. The expected result is Negative.  Fact Sheet for Patients:  BloggerCourse.comhttps://www.fda.gov/media/152166/download  Fact Sheet for Healthcare Providers:  SeriousBroker.ithttps://www.fda.gov/media/152162/download  This test is no t yet approved or cleared by the [place] FDA and  has been authorized for detection and/or diagnosis of SARS-CoV-2 by FDA under an Emergency Use Authorization (EUA). This EUA will remain  in effect (meaning this test can be used) for the duration of the COVID-19 declaration under Section 564(b)(1) of the Act, 21 U.S.C.section 360bbb-3(b)(1), unless the authorization is terminated  or revoked sooner.       Influenza A by PCR NEGATIVE NEGATIVE Final   Influenza B by PCR NEGATIVE NEGATIVE Final    Comment: (NOTE) The Xpert Xpress SARS-CoV-2/FLU/RSV plus assay is intended as an aid in the diagnosis of influenza from Nasopharyngeal swab specimens and should not be used as a sole basis for treatment. Nasal washings and aspirates are unacceptable for Xpert Xpress  SARS-CoV-2/FLU/RSV testing.  Fact Sheet for Patients: BloggerCourse.comhttps://www.fda.gov/media/152166/download  Fact Sheet for Healthcare Providers: SeriousBroker.ithttps://www.fda.gov/media/152162/download  This test is not yet approved or cleared by the [place] FDA and has been authorized for detection and/or diagnosis of SARS-CoV-2 by FDA under an Emergency Use Authorization (EUA). This EUA will remain in effect (meaning this test can be used) for the duration of the COVID-19 declaration under Section 564(b)(1) of the Act, 21 U.S.C. section 360bbb-3(b)(1), unless the authorization is terminated or revoked.  Performed at [facility], [address] [place], [place] [address]      Medications:    enoxaparin (LOVENOX) injection  40 mg Subcutaneous Q24H   Continuous Infusions:  lactated ringers 50  mL/hr at 06/21/20 2123      LOS: 0 days   Marinda Elk  [facility]  06/22/2020, 7:38 AM

## 2020-06-22 NOTE — Plan of Care (Signed)
?  Problem: Education: ?Goal: Expressions of having a comfortable level of knowledge regarding the disease process will increase ?Outcome: Progressing ?  ?Problem: Coping: ?Goal: Ability to adjust to condition or change in health will improve ?Outcome: Progressing ?Goal: Ability to identify appropriate support needs will improve ?Outcome: Progressing ?  ?

## 2020-06-22 NOTE — Consult Note (Signed)
Neurology Consultation  Daimen Shovlin MR# [id number] 06/22/2020  CC: seizure  History is obtained from:patient and chart  HPI: Daniel Foley is a 50 y.o. [facility] PMHx DM, HTN, schizophrenia with witnessed seizure-like activity which lasted about 2 minutes described as eye rolling back and shaking all over his body, followed by about 20 minutes of confusion. Event reportedly occurred after smoking tobacco product around noon PTA and lasted about. He has not memory of event.   No tongue biting, no urine/bowel incontinence.  No prior history of seizures.  ROS: A complete ROS was performed and is negative except as noted in the HPI.  Past Medical History:  Diagnosis Date   Schizophrenia [place] Day)    History reviewed. No pertinent family history. Younger brother - seizure  Social History:  reports that he has been smoking cigarettes. He has never used smokeless tobacco. He reports previous alcohol use.   Prior to Admission medications   Not on File   Exam: Current vital signs: BP 120/84 (BP Location: Right Arm)   Pulse 67   Temp 97.8 F (36.6 C) (Oral)   Resp 15   Ht 6\' 4"  (1.93 m)   Wt 87.9 kg   SpO2 97%   BMI 23.59 kg/m    Physical Exam  Constitutional: Appears well-developed and well-nourished.  Psych: Affect withdrawn Eyes: No scleral injection HENT: No OP obstrucion Head: Normocephalic.  Cardiovascular: Normal rate and regular rhythm.  Respiratory: Effort normal, non-labored breathing GI: Soft.  No distension. There is no tenderness.  Skin: WDI  Neuro: Mental Status: Patient is awake, alert, oriented to person. Patient is not able to give a very clear and coherent history. No signs of aphasia or neglect. He is able to follow simple/2-step commands. Cranial Nerves: II: Visual Fields are full. Pupils are equal, round, and reactive to light.   III,IV, VI: EOMI without ptosis or diplopia.  V: Facial sensation is symmetric to  temperature VII: Facial movement is symmetric.  VIII: hearing is intact to voice X: Uvula elevates symmetrically XI: Shoulder shrug is symmetric. XII: tongue is midline without atrophy or fasciculations.  Motor: Tone is normal. Bulk is normal. 5/5 strength was present in all four extremities.  Sensory: Sensation is symmetric to light touch and temperature in the arms and legs. Deep Tendon Reflexes: 2+ and symmetric in the biceps and patellae.  Plantars: Toes are downgoing bilaterally.  Cerebellar: No dysmetria or movement decompensation.  I have reviewed labs in epic and the pertinent results are:  CBC with Differential     Status: None   Collection Time: 06/21/20  2:21 PM  Result Value Ref Range   WBC 7.6 4.0 - 10.5 K/uL   RBC 4.75 4.22 - 5.81 MIL/uL   Hemoglobin 14.1 13.0 - 17.0 g/dL   HCT 06/23/20 09.7 - 35.3 %   MCV 90.9 80.0 - 100.0 fL   MCH 29.7 26.0 - 34.0 pg   MCHC 32.6 30.0 - 36.0 g/dL   RDW 29.9 24.2 - 68.3 %   Platelets 209 150 - 400 K/uL   nRBC 0.0 0.0 - 0.2 %   Neutrophils Relative % 42 %   Neutro Abs 3.2 1.7 - 7.7 K/uL   Lymphocytes Relative 51 %   Lymphs Abs 3.9 0.7 - 4.0 K/uL   Monocytes Relative 6 %   Monocytes Absolute 0.4 0.1 - 1.0 K/uL   Eosinophils Relative 1 %   Eosinophils Absolute 0.1 0.0 - 0.5 K/uL   Basophils  Relative 0 %   Basophils Absolute 0.0 0.0 - 0.1 K/uL   Immature Granulocytes 0 %   Abs Immature Granulocytes 0.02 0.00 - 0.07 K/uL  Rapid urine drug screen (hospital performed)     Status: None   Collection Time: 06/21/20  2:22 PM  Result Value Ref Range   Opiates NONE DETECTED NONE DETECTED   Cocaine NONE DETECTED NONE DETECTED   Benzodiazepines NONE DETECTED NONE DETECTED   Amphetamines NONE DETECTED NONE DETECTED   Tetrahydrocannabinol NONE DETECTED NONE DETECTED   Barbiturates NONE DETECTED NONE DETECTED  Ethanol     Status: None   Collection Time: 06/21/20  2:22 PM  Result Value Ref Range   Alcohol, Ethyl (B) <10 <10 mg/dL  POC  CBG, ED     Status: None   Collection Time: 06/21/20  2:35 PM  Result Value Ref Range   Glucose-Capillary 99 70 - 99 mg/dL  Comprehensive metabolic panel     Status: Abnormal   Collection Time: 06/21/20  5:10 PM  Result Value Ref Range   Sodium 139 135 - 145 mmol/L   Potassium 4.3 3.5 - 5.1 mmol/L   Chloride 104 98 - 111 mmol/L   CO2 30 22 - 32 mmol/L   Glucose, Bld 80 70 - 99 mg/dL   BUN 9 6 - 20 mg/dL   Creatinine, Ser 1.69 0.61 - 1.24 mg/dL   Calcium 9.5 8.9 - 67.8 mg/dL   Total Protein 7.4 6.5 - 8.1 g/dL   Albumin 3.5 3.5 - 5.0 g/dL   AST 13 (L) 15 - 41 U/L   ALT 7 0 - 44 U/L   Alkaline Phosphatase 45 38 - 126 U/L   Total Bilirubin 0.5 0.3 - 1.2 mg/dL   GFR, Estimated >93 >81 mL/min   Anion gap 5 5 - 15   I have reviewed the images obtained: CT Head Wo Contrast 06/21/2020 did not show acute ischemic process, hemorrhage or mass.  Primary Diagnosis:  Seizure  Secondary Diagnosis: Schizophrenia Tobacco use  Impression: 50 year old man Hx HTN, DM2, schizophrenia and tobacco use with seizure. Per reports, he has a brother with seizures but has never had seizure in the past. Patients in this age group with a fist seizure will need evaluation for provoked seizure. UDS was negative and he will need further evaluation.  Recommendations: MRI brain Routine EEG Will need infectious workup.  Electronically signed: Dr. Marisue Humble Pager: [phone]

## 2020-06-22 NOTE — Progress Notes (Signed)
EEG complete - results pending 

## 2020-06-22 NOTE — Progress Notes (Signed)
Neurology Progress Note  Brief HPI: 50 [facility] PMHx DM, HTN, schizophrenia with witnessed seizure-like activity which lasted about 2 minutes described as eye rolling back and shaking all over his body, followed by about 20 minutes of confusion. Event reportedly occurred after smoking tobacco product around noon PTA.   Subjective: EEG complete with no seizures throughout recording.  No acute overnight events. Patient states that he has had a history of 2 seizures before while in [place] but is unable to recall seizure provocation. He denies ETOH or medication withdrawal seizure history. He is further unable to describe history or presentation of previous seizures but endorses taking his AED medication daily as prescribed.   No current facility-administered medications on file prior to encounter.   Current Outpatient Medications on File Prior to Encounter  Medication Sig Dispense Refill   Accu-Chek Softclix Lancets lancets 1 each by Other route as directed. For blood sugar check     albuterol (VENTOLIN HFA) 108 (90 Base) MCG/ACT inhaler Inhale 2 puffs into the lungs every 4 (four) hours as needed for shortness of breath.     benztropine (COGENTIN) 1 MG tablet Take 1 mg by mouth daily.     budesonide-formoterol (SYMBICORT) 160-4.5 MCG/ACT inhaler Inhale 2 puffs into the lungs 2 (two) times daily.     cholecalciferol (VITAMIN D) 25 MCG (1000 UNIT) tablet Take 1,000 Units by mouth daily.     dapagliflozin propanediol (FARXIGA) 10 MG TABS tablet Take 10 mg by mouth daily.     Deutetrabenazine (AUSTEDO) 6 MG TABS Take 6 mg by mouth in the morning and at bedtime.     divalproex (DEPAKOTE) 500 MG DR tablet Take 1,000 mg by mouth 2 (two) times daily.     ferrous sulfate 325 (65 FE) MG tablet Take 325 mg by mouth daily with breakfast.     Finerenone (KERENDIA) 10 MG TABS Take 10 mg by mouth daily.     glucose blood test strip 1 each by Other route as needed for other  (for blood sugar). Use as instructed     haloperidol (HALDOL) 5 MG tablet Take 5 mg by mouth daily.     Lacosamide 150 MG TABS Take 150 mg by mouth 2 (two) times daily.     levETIRAcetam (KEPPRA) 500 MG tablet Take 500 mg by mouth 2 (two) times daily.     nicotine (NICODERM CQ - DOSED IN MG/24 HOURS) 14 mg/24hr patch Place 14 mg onto the skin daily.     rosuvastatin (CRESTOR) 20 MG tablet Take 20 mg by mouth daily.     Semaglutide (RYBELSUS) 7 MG TABS Take 7 mg by mouth daily.     Tiotropium Bromide Monohydrate (SPIRIVA RESPIMAT) 1.25 MCG/ACT AERS Inhale 2 puffs into the lungs daily.       Exam: Vitals:   06/22/20 0802 06/22/20 0845  BP: 118/75   Pulse: (!) 110 67  Resp: 18 17  Temp: 98.4 F (36.9 C)   SpO2: 95% 94%   Gen: Laying comfortably in bed, sleeping, in no acute distress Resp: non-labored breathing, no respiratory distress Abd: soft, non-tender, non-distended  Neuro: Mental Status: Asleep initially, wakes easily to voice. Alert and oriented to self and age. Incorrectly states that the year is 2006 and when asked the month he states "it is summer time". He is able to state correctly that he is in the hospital after visualizing his surroundings but is unable to provide a clear or coherent  history of present illness. Patient is a poor historian often changing details or history during examination.  Poor attention is noted. Speech is mildly dysarthric though he has poor dentition at baseline.  Naming, repetition, comprehension, and fluency are intact. No aphasia or neglect noted.  Cranial Nerves: PERRL 3 mm / brisk, EOMI, Visual fields full, facial sensation intact and symmetric to light touch, face is symmetric resting and smiling, hearing is intact to voice, palate elevates symmetrically, shoulder shrug 5/5, tongue protrudes midline.  Motor: 5/5 strength throughout without asymmetry. No vertical drift noted on assessment.  Sensory: Intact and symmetric to light touch  throughout.  DTR: 2+ and symmetric biceps and patellae Gait: Deferred  Pertinent Labs: CBC    Component Value Date/Time   WBC 8.4 06/22/2020 0349   RBC 4.87 06/22/2020 0349   HGB 14.4 06/22/2020 0349   HCT 43.8 06/22/2020 0349   PLT 296 06/22/2020 0349   MCV 89.9 06/22/2020 0349   MCH 29.6 06/22/2020 0349   MCHC 32.9 06/22/2020 0349   RDW 13.6 06/22/2020 0349   LYMPHSABS 3.9 06/21/2020 1421   MONOABS 0.4 06/21/2020 1421   EOSABS 0.1 06/21/2020 1421   BASOSABS 0.0 06/21/2020 1421   CMP     Component Value Date/Time   NA 134 (L) 06/22/2020 0349   K 5.8 (H) 06/22/2020 0349   CL 105 06/22/2020 0349   CO2 21 (L) 06/22/2020 0349   GLUCOSE 131 (H) 06/22/2020 0349   BUN 15 06/22/2020 0349   CREATININE 1.02 06/22/2020 0349   CALCIUM 9.8 06/22/2020 0349   PROT 7.3 06/22/2020 0349   ALBUMIN 3.5 06/22/2020 0349   AST 18 06/22/2020 0349   ALT 13 06/22/2020 0349   ALKPHOS 43 06/22/2020 0349   BILITOT 0.9 06/22/2020 0349   GFRNONAA >60 06/22/2020 0349   Ethanol 6/16: < 10   Drugs of Abuse     Component Value Date/Time   LABOPIA NONE DETECTED 06/21/2020 1422   COCAINSCRNUR NONE DETECTED 06/21/2020 1422   LABBENZ NONE DETECTED 06/21/2020 1422   AMPHETMU NONE DETECTED 06/21/2020 1422   THCU NONE DETECTED 06/21/2020 1422   LABBARB NONE DETECTED 06/21/2020 1422    Imaging Reviewed:  CT Head 6/16: No acute intracranial abnormality. Mild parenchymal atrophy, advanced in relation to the patient's given age.  MRI brain: 1. No acute intracranial abnormality. 2. Mild nonspecific periventricular white matter hyperintensity.  EEG 6/17: This study during sleep only showed bilateral frontal sharp transients which are most likely artifactual but epileptogenic nature cannot be definitively excluded.  No seizure were seen throughout the recording. Please consider prolonged EEG monitoring if concern for ictal-interictal activity persists.  Assessment: 50 year old man Hx HTN, DM2,  schizophrenia and tobacco use with seizure. Patient with inconsistent reports initially stating no seizure history before stating 6/17 that he has had two previous seizures with unknown provocation.  - Examination reveals patient with some disorientation but without further neurological deficits. Patient endorses some lethargy following seizure activity.  - Work up so far: UDS negative, brain imaging without acute intracranial abnormality, MRI with mild nonspecific periventricular white matter hyperintensity, EEG with bilateral frontal sharp transients believed to be artifactual but epileptogenic nature cannot be definitively excluded. - Patient endorses history of seizures and compliance with his AEDs. - He denies ETOH except for 2 beers monthly and denies recent changes or missed doses of medications. - Presentation most consistent with seizure and patient self reports history of seizures (last in [place] approximately 9 months ago).  Impression:  Seizure Schizophrenia   Recommendations: - Resume home AED therapy (Depakote, lacosamide and Keppra) - Inpatient seizure precautions until discharge - Consider long term EEG monitoring if there is further evidence of seizure activity - Outpatient seizure precautions: [facility] statutes, patients with seizures are not allowed to drive until they have been seizure-free for six months. Use caution when using heavy equipment or power tools. Avoid working on ladders or at heights. Take showers instead of baths. Ensure the water temperature is not too high on the home water heater. Do not go swimming alone. Do not lock yourself in a room alone (i.e. bathroom). When caring for infants or small children, sit down when holding, feeding, or changing them to minimize risk of injury to the child in the event you have a seizure. Maintain good sleep hygiene. Avoid alcohol.  - Interview patient when he is more awake and coherent regarding his level of  compliance with medications.  - Obtain VPA level (ordered)  Lanae Boast, AGACNP-BC [facility] [phone]  Electronically signed: Dr. Caryl Pina

## 2020-06-22 NOTE — Procedures (Signed)
Patient Name: Daniel Foley  MRN: [id number]  Epilepsy Attending: Charlsie Quest  Referring Physician/Provider: Dr. Marisue Humble Date: 06/22/2020 Duration: 33.43 mins  Patient history: 50 year old male with seizure-like episode.  EEG to evaluate for seizures.  Level of alertness:asleep  AEDs during EEG study: Depakote, Keppra, Vimpat  Technical aspects: This EEG study was done with scalp electrodes positioned according to the 10-20 International system of electrode placement. Electrical activity was acquired at a sampling rate of 500Hz  and reviewed with a high frequency filter of 70Hz  and a low frequency filter of 1Hz . EEG data were recorded continuously and digitally stored.   Description: Sleep was characterized by vertex waves, sleep spindles (12 to 14 Hz), maximal frontocentral region. Sharp transients were seen in bilateral frontal region. Hyperventilation and photic stimulation were not performed.     IMPRESSION: This study during sleep only showed bilateral frontal sharp transients which are most likely artifactual but epileptogenic nature cannot be definitively excluded.  No seizure were seen throughout the recording.  Please consider prolonged EEG monitoring if concern for ictal-interictal activity persists.  Beaux Wedemeyer 

## 2020-06-23 NOTE — Progress Notes (Addendum)
Valproic acid level came back low at 31. This may be due to either fast metabolism or poor compliance.   A/R: 50 year old male with breakthrough seizures - Would interview the patient regarding whether or not he is fully compliant with his Depakote regimen. If fully compliant, then call Neurology for recommended increased dose of Depakote. If not fully compliant, then advise patient regarding the need to take his medication as scheduled without missing doses.  - Neurohospitalist service will be available PRN.   Electronically signed: Dr. Caryl Pina

## 2020-06-23 NOTE — Progress Notes (Signed)
[facility] PROGRESS NOTE    Progress Note  Daniel Foley  PPJ:[id number] DOB: 1970-08-14 DOA: 06/21/2020 PCP: Oneita Hurt, No     Brief Narrative:   Daniel Foley is an 50 y.o. male past medical history significant for type 2 diabetes mellitus, essential hypertension schizophrenia comes into the ED for from a group home for seizure-like activity that started at noon on the day of admission, according to staff and lasted about 20 minutes continuously no prior history of seizures no biting of the tongue no loss of sphincter control no use of illicit drugs or alcohol.  Neurology was consulted by the ED physician who recommended MRI and EEG.  Temperature of 99 vital stable, sodium 139 bicarb 30 glucose of 80 CT of the head unremarkable UDS negative.  She has no history of seizures but she is on 2 seizure medications Vimpat and Depakote     Assessment/Plan:   Witnessed seizure-like activity (HCC) No loss of control sphincters. MRI of the brain showed no acute abnormalities.  UDS unremarkable. Neurology recommended to continue Vimpat Keppra and Depakote. Physical therapy evaluation is pending.  Schizophrenia: Resume current home regimen.  Essential hypertension: Blood pressure not at goal resume home regimen.  Transient bradycardia: With a heart rate of 49 sinus no other events on telemetry.   DVT prophylaxis: lovenox Family Communication:none Status is: Observation  The patient remains OBS appropriate and will d/c before 2 midnights.  Dispo:  Patient From: Group Home  Planned Disposition: Group Home  Medically stable for discharge: No       Code Status:     Code Status Orders  (From admission, onward)           Start     Ordered   06/21/20 1956  Full code  Continuous        06/21/20 1956           Code Status History     This patient has a current code status but no historical code status.         IV Access:   Peripheral IV   Procedures and  diagnostic studies:   CT Head Wo Contrast  Result Date: 06/21/2020 CLINICAL DATA:  Seizures EXAM: CT HEAD WITHOUT CONTRAST TECHNIQUE: Contiguous axial images were obtained from the base of the skull through the vertex without intravenous contrast. COMPARISON:  None. FINDINGS: Brain: Normal anatomic configuration. Parenchymal volume loss is slightly advanced given the patient's age. Mild periventricular white matter changes are present likely reflecting the sequela of small vessel ischemia. No abnormal intra or extra-axial mass lesion or fluid collection. No abnormal mass effect or midline shift. No evidence of acute intracranial hemorrhage or infarct. Ventricular size is normal. Cerebellum unremarkable. Vascular: No asymmetric hyperdense vasculature at the skull base. Skull: Intact Sinuses/Orbits: Paranasal sinuses are clear. Orbits are unremarkable. Other: Mastoid air cells and middle ear cavities are clear. IMPRESSION: No acute intracranial abnormality. Mild parenchymal atrophy, advanced in relation to the patient's given age. Electronically Signed   By: Helyn Numbers MD   On: 06/21/2020 15:21   MR Brain W and Wo Contrast  Result Date: 06/21/2020 CLINICAL DATA:  Seizure EXAM: MRI HEAD WITHOUT AND WITH CONTRAST TECHNIQUE: Multiplanar, multiecho pulse sequences of the brain and surrounding structures were obtained without and with intravenous contrast. CONTRAST:  97mL GADAVIST GADOBUTROL 1 MMOL/ML IV SOLN COMPARISON:  None. FINDINGS: Brain: No acute infarct, mass effect or extra-axial collection. No acute or chronic hemorrhage. Mild nonspecific periventricular white matter hyperintensity. The  midline structures are normal. There is no abnormal contrast enhancement. Vascular: Major flow voids are preserved. Skull and upper cervical spine: Normal calvarium and skull base. Visualized upper cervical spine and soft tissues are normal. Sinuses/Orbits:No paranasal sinus fluid levels or advanced mucosal thickening.  No mastoid or middle ear effusion. Normal orbits. IMPRESSION: 1. No acute intracranial abnormality. 2. Mild nonspecific periventricular white matter hyperintensity. Electronically Signed   By: Deatra RobinsonKevin  Herman M.D.   On: 06/21/2020 21:35   EEG adult  Result Date: 06/22/2020 Charlsie QuestYadav, Priyanka O, MD     06/22/2020 11:09 AM Patient Name: Daniel Foley MRN: [id number] Epilepsy Attending: Charlsie QuestPriyanka O Yadav Referring Physician/Provider: Dr. Marisue HumbleHunter Collins Date: 06/22/2020 Duration: 33.43 mins Patient history: 50 year old male with seizure-like episode.  EEG to evaluate for seizures. Level of alertness:asleep AEDs during EEG study: Depakote, Keppra, Vimpat Technical aspects: This EEG study was done with scalp electrodes positioned according to the 10-20 International system of electrode placement. Electrical activity was acquired at a sampling rate of 500Hz  and reviewed with a high frequency filter of 70Hz  and a low frequency filter of 1Hz . EEG data were recorded continuously and digitally stored. Description: Sleep was characterized by vertex waves, sleep spindles (12 to 14 Hz), maximal frontocentral region. Sharp transients were seen in bilateral frontal region. Hyperventilation and photic stimulation were not performed.   IMPRESSION: This study during sleep only showed bilateral frontal sharp transients which are most likely artifactual but epileptogenic nature cannot be definitively excluded.  No seizure were seen throughout the recording. Please consider prolonged EEG monitoring if concern for ictal-interictal activity persists. Charlsie QuestPriyanka O Yadav     Medical Consultants:   None.   Subjective:    Daniel Foley no complains awake this morning able to carry on a regular conversation.  Objective:    Vitals:   06/22/20 1928 06/22/20 2006 06/22/20 2326 06/23/20 0416  BP: 115/73  104/67 (!) 144/67  Pulse: 71 71 60 67  Resp: 18 18 18 19   Temp: 98.3 F (36.8 C)  98.2 F (36.8 C) (!) 97.4 F (36.3 C)   TempSrc: Oral  Oral Oral  SpO2: 96% 95% 93% 96%  Weight:      Height:       SpO2: 96 %   Intake/Output Summary (Last 24 hours) at 06/23/2020 0750 Last data filed at 06/22/2020 2211 Gross per 24 hour  Intake 200 ml  Output 600 ml  Net -400 ml    Filed Weights   06/22/20 0019  Weight: 87.9 kg    Exam: General exam: In no acute distress. Respiratory system: Good air movement and clear to auscultation. Cardiovascular system: S1 & S2 heard, RRR. No JVD. Gastrointestinal system: Abdomen is nondistended, soft and nontender.  Extremities: No pedal edema. Skin: No rashes, lesions or ulcers  Data Reviewed:    Labs: Basic Metabolic Panel: Recent Labs  Lab 06/21/20 1710 06/22/20 0349  NA 139 134*  K 4.3 5.8*  CL 104 105  CO2 30 21*  GLUCOSE 80 131*  BUN 9 15  CREATININE 1.18 1.02  CALCIUM 9.5 9.8  MG  --  1.9  PHOS  --  2.0*    GFR Estimated Creatinine Clearance: 106.4 mL/min (by C-G formula based on SCr of 1.02 mg/dL). Liver Function Tests: Recent Labs  Lab 06/21/20 1710 06/22/20 0349  AST 13* 18  ALT 7 13  ALKPHOS 45 43  BILITOT 0.5 0.9  PROT 7.4 7.3  ALBUMIN 3.5 3.5    No results for input(s): LIPASE,  AMYLASE in the last 168 hours. No results for input(s): AMMONIA in the last 168 hours. Coagulation profile No results for input(s): INR, PROTIME in the last 168 hours. COVID-19 Labs  No results for input(s): DDIMER, FERRITIN, LDH, CRP in the last 72 hours.  Lab Results  Component Value Date   SARSCOV2NAA NEGATIVE 06/21/2020    CBC: Recent Labs  Lab 06/21/20 1421 06/22/20 0349  WBC 7.6 8.4  NEUTROABS 3.2  --   HGB 14.1 14.4  HCT 43.2 43.8  MCV 90.9 89.9  PLT 209 296    Cardiac Enzymes: No results for input(s): CKTOTAL, CKMB, CKMBINDEX, TROPONINI in the last 168 hours. BNP (last 3 results) No results for input(s): PROBNP in the last 8760 hours. CBG: Recent Labs  Lab 06/21/20 1435 06/22/20 0609  GLUCAP 99 191*    D-Dimer: No  results for input(s): DDIMER in the last 72 hours. Hgb A1c: No results for input(s): HGBA1C in the last 72 hours. Lipid Profile: No results for input(s): CHOL, HDL, LDLCALC, TRIG, CHOLHDL, LDLDIRECT in the last 72 hours. Thyroid function studies: No results for input(s): TSH, T4TOTAL, T3FREE, THYROIDAB in the last 72 hours.  Invalid input(s): FREET3 Anemia work up: No results for input(s): VITAMINB12, FOLATE, FERRITIN, TIBC, IRON, RETICCTPCT in the last 72 hours. Sepsis Labs: Recent Labs  Lab 06/21/20 1421 06/22/20 0349  WBC 7.6 8.4    Microbiology Recent Results (from the past 240 hour(s))  Resp Panel by RT-PCR (Flu A&B, Covid) Nasopharyngeal Swab     Status: None   Collection Time: 06/21/20 10:38 PM   Specimen: Nasopharyngeal Swab; Nasopharyngeal(NP) swabs in vial transport medium  Result Value Ref Range Status   SARS Coronavirus 2 by RT PCR NEGATIVE NEGATIVE Final    Comment: (NOTE) SARS-CoV-2 target nucleic acids are NOT DETECTED.  The SARS-CoV-2 RNA is generally detectable in upper respiratory specimens during the acute phase of infection. The lowest concentration of SARS-CoV-2 viral copies this assay can detect is 138 copies/mL. A negative result does not preclude SARS-Cov-2 infection and should not be used as the sole basis for treatment or other patient management decisions. A negative result may occur with  improper specimen collection/handling, submission of specimen other than nasopharyngeal swab, presence of viral mutation(s) within the areas targeted by this assay, and inadequate number of viral copies(<138 copies/mL). A negative result must be combined with clinical observations, patient history, and epidemiological information. The expected result is Negative.  Fact Sheet for Patients:  BloggerCourse.com  Fact Sheet for Healthcare Providers:  SeriousBroker.it  This test is no t yet approved or cleared by  the [place] FDA and  has been authorized for detection and/or diagnosis of SARS-CoV-2 by FDA under an Emergency Use Authorization (EUA). This EUA will remain  in effect (meaning this test can be used) for the duration of the COVID-19 declaration under Section 564(b)(1) of the Act, 21 U.S.C.section 360bbb-3(b)(1), unless the authorization is terminated  or revoked sooner.       Influenza A by PCR NEGATIVE NEGATIVE Final   Influenza B by PCR NEGATIVE NEGATIVE Final    Comment: (NOTE) The Xpert Xpress SARS-CoV-2/FLU/RSV plus assay is intended as an aid in the diagnosis of influenza from Nasopharyngeal swab specimens and should not be used as a sole basis for treatment. Nasal washings and aspirates are unacceptable for Xpert Xpress SARS-CoV-2/FLU/RSV testing.  Fact Sheet for Patients: BloggerCourse.com  Fact Sheet for Healthcare Providers: SeriousBroker.it  This test is not yet approved or cleared by the  [place] [employer] trader and has been authorized for detection and/or diagnosis of SARS-CoV-2 by FDA under an TEFL teacher (EUA). This EUA will remain in effect (meaning this test can be used) for the duration of the COVID-19 declaration under Section 564(b)(1) of the Act, 21 U.S.C. section 360bbb-3(b)(1), unless the authorization is terminated or revoked.  Performed at [facility], [address], [place], [place] [address]   MRSA PCR Screening     Status: None   Collection Time: 06/22/20  5:33 PM  Result Value Ref Range Status   MRSA by PCR NEGATIVE NEGATIVE Final    Comment:        The GeneXpert MRSA Assay (FDA approved for NASAL specimens only), is one component of a comprehensive MRSA colonization surveillance program. It is not intended to diagnose MRSA infection nor to guide or monitor treatment for MRSA infections. Performed at [facility], [address], [place], [place] [address]       Medications:    benztropine  1 mg Oral Daily   dapagliflozin propanediol  10 mg Oral Daily   Deutetrabenazine  6 mg Oral BID   divalproex  1,000 mg Oral BID   enoxaparin (LOVENOX) injection  40 mg Subcutaneous Q24H   Finerenone  10 mg Oral Daily   haloperidol  5 mg Oral Daily   lacosamide  150 mg Oral BID   levETIRAcetam  500 mg Oral BID   mometasone-formoterol  2 puff Inhalation BID   nicotine  14 mg Transdermal Daily   rosuvastatin  20 mg Oral Daily   Semaglutide  7 mg Oral Daily   umeclidinium bromide  1 puff Inhalation Daily   Continuous Infusions:      LOS: 1 day   Marinda Elk  [facility]  06/23/2020, 7:50 AM

## 2020-06-23 NOTE — Evaluation (Signed)
Physical Therapy Evaluation and Discharge Patient Details Name: Daniel Foley MRN: [id number] DOB: [birth year] Today's Date: 06/23/2020   History of Present Illness  Pt presents 6/16 after witnessed seizure like activity at his group home. MRI neg. No seizures on EEG.  PMH: DM2, HTN, schizophrenia.  Clinical Impression  Patient evaluated by Physical Therapy with no further acute PT needs identified. All education has been completed and the patient has no further questions. Pt received in bed, heard speaking to self in room and intermittently laughing, he continued to do this throughout session, appears to be his baseline. Tremor noted on eval that he reports has been present PTA. Pt independent with bed mobility, transfers, and gait. Was able to make quick turns, starts/ stops, changes in speed without hindrance or LOB. Pt ascended 10 stairs without use of rail without LOB. No strength deficits noted.  See below for any follow-up Physical Therapy or equipment needs. PT is signing off. Thank you for this referral.     Follow Up Recommendations No PT follow up    Equipment Recommendations  None recommended by PT    Recommendations for Other Services       Precautions / Restrictions Precautions Precautions: None Restrictions Weight Bearing Restrictions: No      Mobility  Bed Mobility Overal bed mobility: Independent                  Transfers Overall transfer level: Independent                  Ambulation/Gait Ambulation/Gait assistance: Independent Gait Distance (Feet): 300 Feet Assistive device: None Gait Pattern/deviations: WFL(Within Functional Limits) Gait velocity: WFL Gait velocity interpretation: >4.37 ft/sec, indicative of normal walking speed General Gait Details: pt able to make quick, turns, quick starts/stops, change speed, reach out of BOS  Stairs Stairs: Yes Stairs assistance: Independent Stair Management: No rails;Alternating  pattern;Forwards Number of Stairs: 10 General stair comments: no difficulty with stairs without use of rail  Wheelchair Mobility    Modified Rankin (Stroke Patients Only)       Balance Overall balance assessment: Modified Independent                                           Pertinent Vitals/Pain Pain Assessment: No/denies pain    Home Living Family/patient expects to be discharged to:: Group home                 Additional Comments: has lived here 6 mos since coming from [place], sister and nephew are here    Prior Function Level of Independence: Needs assistance   Gait / Transfers Assistance Needed: independent, does not drive  ADL's / Homemaking Assistance Needed: laundry and cooking are done for him at group home. Independent with ADL's        Hand Dominance        Extremity/Trunk Assessment   Upper Extremity Assessment Upper Extremity Assessment: Overall WFL for tasks assessed (noted tremor L hand, pt reports is baseline)    Lower Extremity Assessment Lower Extremity Assessment: Overall WFL for tasks assessed    Cervical / Trunk Assessment Cervical / Trunk Assessment: Normal  Communication   Communication: No difficulties  Cognition Arousal/Alertness: Awake/alert Behavior During Therapy: WFL for tasks assessed/performed Overall Cognitive Status: History of cognitive impairments - at baseline  General Comments: schizophrenic, cognition appears close to baseline      General Comments General comments (skin integrity, edema, etc.): pt denies injury when he fell when seizure happened. He denies other falls. Pt conversant throughout session, could not always understand what he was talking about    Exercises     Assessment/Plan    PT Assessment Patent does not need any further PT services  PT Problem List         PT Treatment Interventions      PT Goals (Current goals can be found  in the Care Plan section)  Acute Rehab PT Goals Patient Stated Goal: leave today PT Goal Formulation: All assessment and education complete, DC therapy    Frequency     Barriers to discharge        Co-evaluation               AM-PAC PT "6 Clicks" Mobility  Outcome Measure Help needed turning from your back to your side while in a flat bed without using bedrails?: None Help needed moving from lying on your back to sitting on the side of a flat bed without using bedrails?: None Help needed moving to and from a bed to a chair (including a wheelchair)?: None Help needed standing up from a chair using your arms (e.g., wheelchair or bedside chair)?: None Help needed to walk in hospital room?: None Help needed climbing 3-5 steps with a railing? : None 6 Click Score: 24    End of Session Equipment Utilized During Treatment: Gait belt Activity Tolerance: Patient tolerated treatment well Patient left: in chair;with call bell/phone within reach Nurse Communication: Mobility status PT Visit Diagnosis: Unsteadiness on feet (R26.81)    Time: 7867-6720 PT Time Calculation (min) (ACUTE ONLY): 13 min   Charges:   PT Evaluation $PT Eval Low Complexity: 1 Low          Lyanne Co, PT  Acute Rehab Services  Pager [phone] Office [phone]   Lawana Chambers Thalya Fouche 06/23/2020, 8:52 AM

## 2020-06-24 LAB — GLUCOSE, CAPILLARY: Glucose-Capillary: 113 mg/dL — ABNORMAL HIGH (ref 70–99)

## 2020-06-24 NOTE — TOC Transition Note (Signed)
Transition of Care [facility]) - CM/SW Discharge Note   Patient Details  Name: Daniel Foley MRN: [id number] Date of Birth: Dec 03, 1970  Transition of [facility]) CM/SW Contact:  Carley Hammed, LCSWA Phone Number: 06/24/2020, 1:29 PM   Clinical Narrative:    CSW was notified that pt is ready to DC, and is from a group home. CSW spoke with Group home staff Harvie Heck who will be here to pick up pt shortly. He noted no further needs. CSW will sign off at this time.   Final next level of care: Group Home Barriers to Discharge: Barriers Resolved   Patient Goals and CMS Choice        Discharge Placement              Patient chooses bed at:  [facility]) Patient to be transferred to facility by: Group home Name of family member notified: Group home staff Patient and family notified of of transfer: 06/24/20  Discharge Plan and Services                                     Social Determinants of Health (SDOH) Interventions     Readmission Risk Interventions No flowsheet data found.

## 2020-06-24 NOTE — Discharge Summary (Signed)
Physician Discharge Summary  Daniel Foley YBO:[id number] DOB: [birth year] DOA: 06/21/2020  PCP: Pcp, No  Admit date: 06/21/2020 Discharge date: 06/24/2020  Admitted From: Group Home Disposition:  Group home  Recommendations for Outpatient Follow-up:  Follow up with PCP in 1-2 weeks Please obtain BMP/CBC in one week Please follow up on the following pending results:  Home Health:no Equipment/Devices:None  Discharge Condition:Stable CODE STATUS:Full Diet recommendation: Heart Healthy   Brief/Interim Summary: 50 y.o. male past medical history significant for type 2 diabetes mellitus, essential hypertension schizophrenia comes into the ED for from a group home for seizure-like activity that started at noon on the day of admission, according to staff and lasted about 20 minutes continuously no prior history of seizures no biting of the tongue no loss of sphincter control no use of illicit drugs or alcohol.  Neurology was consulted by the ED physician who recommended MRI and EEG  Discharge Diagnoses:  Active Problems:   Witnessed seizure-like activity (HCC)   Seizure (HCC)   Observed seizure-like activity (HCC)  Witnessed seizures: With loss of consciousness MRI of the brain showed no acute allergies. UDS was negative. Likely due to noncompliance with his medication he was restarted on Vimpat Keppra and Depakote. Neurology was consulted and agreed. Physical therapy evaluated the patient and recommended no home health PT.  Schizophrenia: Continue current home regimen, he is on Haldol has anti-Parkinson's symptoms.  Essential hypertension: No changes made to his medication.  Discharge Instructions  Discharge Instructions     Diet - low sodium heart healthy   Complete by: As directed    Increase activity slowly   Complete by: As directed       Allergies as of 06/24/2020       Reactions   Contrast Media [iodinated Diagnostic Agents] Hives, Rash   Patient had contrast in  MRI, arrived back to room with redness, rash and hives on his face. Reports burning, no itching.        Medication List     TAKE these medications    Accu-Chek Softclix Lancets lancets 1 each by Other route as directed. For blood sugar check   albuterol 108 (90 Base) MCG/ACT inhaler Commonly known as: VENTOLIN HFA Inhale 2 puffs into the lungs every 4 (four) hours as needed for shortness of breath.   Austedo 6 MG Tabs Generic drug: Deutetrabenazine Take 6 mg by mouth in the morning and at bedtime.   benztropine 1 MG tablet Commonly known as: COGENTIN Take 1 mg by mouth daily.   budesonide-formoterol 160-4.5 MCG/ACT inhaler Commonly known as: SYMBICORT Inhale 2 puffs into the lungs 2 (two) times daily.   cholecalciferol 25 MCG (1000 UNIT) tablet Commonly known as: VITAMIN D Take 1,000 Units by mouth daily.   dapagliflozin propanediol 10 MG Tabs tablet Commonly known as: FARXIGA Take 10 mg by mouth daily.   divalproex 500 MG DR tablet Commonly known as: DEPAKOTE Take 1,000 mg by mouth 2 (two) times daily.   ferrous sulfate 325 (65 FE) MG tablet Take 325 mg by mouth daily with breakfast.   glucose blood test strip 1 each by Other route as needed for other (for blood sugar). Use as instructed   haloperidol 5 MG tablet Commonly known as: HALDOL Take 5 mg by mouth daily.   Kerendia 10 MG Tabs Generic drug: Finerenone Take 10 mg by mouth daily.   Lacosamide 150 MG Tabs Take 150 mg by mouth 2 (two) times daily.   levETIRAcetam 500 MG tablet Commonly known  as: KEPPRA Take 500 mg by mouth 2 (two) times daily.   nicotine 14 mg/24hr patch Commonly known as: NICODERM CQ - dosed in mg/24 hours Place 14 mg onto the skin daily.   rosuvastatin 20 MG tablet Commonly known as: CRESTOR Take 20 mg by mouth daily.   Rybelsus 7 MG Tabs Generic drug: Semaglutide Take 7 mg by mouth daily.   Spiriva Respimat 1.25 MCG/ACT Aers Generic drug: Tiotropium Bromide  Monohydrate Inhale 2 puffs into the lungs daily.        Allergies  Allergen Reactions   Contrast Media [Iodinated Diagnostic Agents] Hives and Rash    Patient had contrast in MRI, arrived back to room with redness, rash and hives on his face. Reports burning, no itching.    Consultations: Neurology   Procedures/Studies: CT Head Wo Contrast  Result Date: 06/21/2020 CLINICAL DATA:  Seizures EXAM: CT HEAD WITHOUT CONTRAST TECHNIQUE: Contiguous axial images were obtained from the base of the skull through the vertex without intravenous contrast. COMPARISON:  None. FINDINGS: Brain: Normal anatomic configuration. Parenchymal volume loss is slightly advanced given the patient's age. Mild periventricular white matter changes are present likely reflecting the sequela of small vessel ischemia. No abnormal intra or extra-axial mass lesion or fluid collection. No abnormal mass effect or midline shift. No evidence of acute intracranial hemorrhage or infarct. Ventricular size is normal. Cerebellum unremarkable. Vascular: No asymmetric hyperdense vasculature at the skull base. Skull: Intact Sinuses/Orbits: Paranasal sinuses are clear. Orbits are unremarkable. Other: Mastoid air cells and middle ear cavities are clear. IMPRESSION: No acute intracranial abnormality. Mild parenchymal atrophy, advanced in relation to the patient's given age. Electronically Signed   By: Helyn NumbersAshesh  Parikh MD   On: 06/21/2020 15:21   MR Brain W and Wo Contrast  Result Date: 06/21/2020 CLINICAL DATA:  Seizure EXAM: MRI HEAD WITHOUT AND WITH CONTRAST TECHNIQUE: Multiplanar, multiecho pulse sequences of the brain and surrounding structures were obtained without and with intravenous contrast. CONTRAST:  8mL GADAVIST GADOBUTROL 1 MMOL/ML IV SOLN COMPARISON:  None. FINDINGS: Brain: No acute infarct, mass effect or extra-axial collection. No acute or chronic hemorrhage. Mild nonspecific periventricular white matter hyperintensity. The  midline structures are normal. There is no abnormal contrast enhancement. Vascular: Major flow voids are preserved. Skull and upper cervical spine: Normal calvarium and skull base. Visualized upper cervical spine and soft tissues are normal. Sinuses/Orbits:No paranasal sinus fluid levels or advanced mucosal thickening. No mastoid or middle ear effusion. Normal orbits. IMPRESSION: 1. No acute intracranial abnormality. 2. Mild nonspecific periventricular white matter hyperintensity. Electronically Signed   By: Deatra RobinsonKevin  Herman M.D.   On: 06/21/2020 21:35   EEG adult  Result Date: 06/22/2020 Charlsie QuestYadav, Priyanka O, MD     06/22/2020 11:09 AM Patient Name: Daniel GaultGerald Foley MRN: [id number] Epilepsy Attending: Charlsie QuestPriyanka O Yadav Referring Physician/Provider: Dr. Marisue HumbleHunter Collins Date: 06/22/2020 Duration: 33.43 mins Patient history: 50 year old male with seizure-like episode.  EEG to evaluate for seizures. Level of alertness:asleep AEDs during EEG study: Depakote, Keppra, Vimpat Technical aspects: This EEG study was done with scalp electrodes positioned according to the 10-20 International system of electrode placement. Electrical activity was acquired at a sampling rate of 500Hz  and reviewed with a high frequency filter of 70Hz  and a low frequency filter of 1Hz . EEG data were recorded continuously and digitally stored. Description: Sleep was characterized by vertex waves, sleep spindles (12 to 14 Hz), maximal frontocentral region. Sharp transients were seen in bilateral frontal region. Hyperventilation and photic stimulation were not performed.  IMPRESSION: This study during sleep only showed bilateral frontal sharp transients which are most likely artifactual but epileptogenic nature cannot be definitively excluded.  No seizure were seen throughout the recording. Please consider prolonged EEG monitoring if concern for ictal-interictal activity persists. Priyanka Annabelle Harman   (Echo, Carotid, EGD, Colonoscopy, ERCP)     Subjective: No complaints Discharge Exam: Vitals:   06/23/20 2345 06/24/20 0757  BP: (!) 125/98 (!) 132/104  Pulse: 74 92  Resp:    Temp: 97.7 F (36.5 C) 98 F (36.7 C)  SpO2: 100% 99%   Vitals:   06/23/20 2003 06/23/20 2007 06/23/20 2345 06/24/20 0757  BP: (!) 127/114  (!) 125/98 (!) 132/104  Pulse: 68 73 74 92  Resp: 19 16    Temp: 98.6 F (37 C)  97.7 F (36.5 C) 98 F (36.7 C)  TempSrc: Oral   Oral  SpO2: 100% 98% 100% 99%  Weight:      Height:        General: Pt is alert, awake, not in acute distress Cardiovascular: RRR, S1/S2 +, no rubs, no gallops Respiratory: CTA bilaterally, no wheezing, no rhonchi Abdominal: Soft, NT, ND, bowel sounds + Extremities: no edema, no cyanosis    The results of significant diagnostics from this hospitalization (including imaging, microbiology, ancillary and laboratory) are listed below for reference.     Microbiology: Recent Results (from the past 240 hour(s))  Resp Panel by RT-PCR (Flu A&B, Covid) Nasopharyngeal Swab     Status: None   Collection Time: 06/21/20 10:38 PM   Specimen: Nasopharyngeal Swab; Nasopharyngeal(NP) swabs in vial transport medium  Result Value Ref Range Status   SARS Coronavirus 2 by RT PCR NEGATIVE NEGATIVE Final    Comment: (NOTE) SARS-CoV-2 target nucleic acids are NOT DETECTED.  The SARS-CoV-2 RNA is generally detectable in upper respiratory specimens during the acute phase of infection. The lowest concentration of SARS-CoV-2 viral copies this assay can detect is 138 copies/mL. A negative result does not preclude SARS-Cov-2 infection and should not be used as the sole basis for treatment or other patient management decisions. A negative result may occur with  improper specimen collection/handling, submission of specimen other than nasopharyngeal swab, presence of viral mutation(s) within the areas targeted by this assay, and inadequate number of viral copies(<138 copies/mL). A negative  result must be combined with clinical observations, patient history, and epidemiological information. The expected result is Negative.  Fact Sheet for Patients:  BloggerCourse.com  Fact Sheet for Healthcare Providers:  SeriousBroker.it  This test is no t yet approved or cleared by the [place] FDA and  has been authorized for detection and/or diagnosis of SARS-CoV-2 by FDA under an Emergency Use Authorization (EUA). This EUA will remain  in effect (meaning this test can be used) for the duration of the COVID-19 declaration under Section 564(b)(1) of the Act, 21 U.S.C.section 360bbb-3(b)(1), unless the authorization is terminated  or revoked sooner.       Influenza A by PCR NEGATIVE NEGATIVE Final   Influenza B by PCR NEGATIVE NEGATIVE Final    Comment: (NOTE) The Xpert Xpress SARS-CoV-2/FLU/RSV plus assay is intended as an aid in the diagnosis of influenza from Nasopharyngeal swab specimens and should not be used as a sole basis for treatment. Nasal washings and aspirates are unacceptable for Xpert Xpress SARS-CoV-2/FLU/RSV testing.  Fact Sheet for Patients: BloggerCourse.com  Fact Sheet for Healthcare Providers: SeriousBroker.it  This test is not yet approved or cleared by the [place] FDA and has  been authorized for detection and/or diagnosis of SARS-CoV-2 by FDA under an Emergency Use Authorization (EUA). This EUA will remain in effect (meaning this test can be used) for the duration of the COVID-19 declaration under Section 564(b)(1) of the Act, 21 U.S.C. section 360bbb-3(b)(1), unless the authorization is terminated or revoked.  Performed at [facility], [address], [place], [place] [address]   MRSA PCR Screening     Status: None   Collection Time: 06/22/20  5:33 PM  Result Value Ref Range Status   MRSA by PCR NEGATIVE NEGATIVE Final    Comment:         The GeneXpert MRSA Assay (FDA approved for NASAL specimens only), is one component of a comprehensive MRSA colonization surveillance program. It is not intended to diagnose MRSA infection nor to guide or monitor treatment for MRSA infections. Performed at [facility], [address], [place], [place] [address]      Labs: BNP (last 3 results) No results for input(s): BNP in the last 8760 hours. Basic Metabolic Panel: Recent Labs  Lab 06/21/20 1710 06/22/20 0349  NA 139 134*  K 4.3 5.8*  CL 104 105  CO2 30 21*  GLUCOSE 80 131*  BUN 9 15  CREATININE 1.18 1.02  CALCIUM 9.5 9.8  MG  --  1.9  PHOS  --  2.0*   Liver Function Tests: Recent Labs  Lab 06/21/20 1710 06/22/20 0349  AST 13* 18  ALT 7 13  ALKPHOS 45 43  BILITOT 0.5 0.9  PROT 7.4 7.3  ALBUMIN 3.5 3.5   No results for input(s): LIPASE, AMYLASE in the last 168 hours. No results for input(s): AMMONIA in the last 168 hours. CBC: Recent Labs  Lab 06/21/20 1421 06/22/20 0349  WBC 7.6 8.4  NEUTROABS 3.2  --   HGB 14.1 14.4  HCT 43.2 43.8  MCV 90.9 89.9  PLT 209 296   Cardiac Enzymes: No results for input(s): CKTOTAL, CKMB, CKMBINDEX, TROPONINI in the last 168 hours. BNP: Invalid input(s): POCBNP CBG: Recent Labs  Lab 06/21/20 1435 06/22/20 0609 06/24/20 0544  GLUCAP 99 191* 113*   D-Dimer No results for input(s): DDIMER in the last 72 hours. Hgb A1c No results for input(s): HGBA1C in the last 72 hours. Lipid Profile No results for input(s): CHOL, HDL, LDLCALC, TRIG, CHOLHDL, LDLDIRECT in the last 72 hours. Thyroid function studies No results for input(s): TSH, T4TOTAL, T3FREE, THYROIDAB in the last 72 hours.  Invalid input(s): FREET3 Anemia work up No results for input(s): VITAMINB12, FOLATE, FERRITIN, TIBC, IRON, RETICCTPCT in the last 72 hours. Urinalysis    Component Value Date/Time   COLORURINE YELLOW 06/22/2020 0720   APPEARANCEUR CLEAR 06/22/2020 0720   LABSPEC  1.028 06/22/2020 0720   PHURINE 6.0 06/22/2020 0720   GLUCOSEU >=500 (A) 06/22/2020 0720   HGBUR NEGATIVE 06/22/2020 0720   BILIRUBINUR NEGATIVE 06/22/2020 0720   KETONESUR NEGATIVE 06/22/2020 0720   PROTEINUR NEGATIVE 06/22/2020 0720   NITRITE NEGATIVE 06/22/2020 0720   LEUKOCYTESUR NEGATIVE 06/22/2020 0720   Sepsis Labs Invalid input(s): PROCALCITONIN,  WBC,  LACTICIDVEN Microbiology Recent Results (from the past 240 hour(s))  Resp Panel by RT-PCR (Flu A&B, Covid) Nasopharyngeal Swab     Status: None   Collection Time: 06/21/20 10:38 PM   Specimen: Nasopharyngeal Swab; Nasopharyngeal(NP) swabs in vial transport medium  Result Value Ref Range Status   SARS Coronavirus 2 by RT PCR NEGATIVE NEGATIVE Final    Comment: (NOTE) SARS-CoV-2 target  nucleic acids are NOT DETECTED.  The SARS-CoV-2 RNA is generally detectable in upper respiratory specimens during the acute phase of infection. The lowest concentration of SARS-CoV-2 viral copies this assay can detect is 138 copies/mL. A negative result does not preclude SARS-Cov-2 infection and should not be used as the sole basis for treatment or other patient management decisions. A negative result may occur with  improper specimen collection/handling, submission of specimen other than nasopharyngeal swab, presence of viral mutation(s) within the areas targeted by this assay, and inadequate number of viral copies(<138 copies/mL). A negative result must be combined with clinical observations, patient history, and epidemiological information. The expected result is Negative.  Fact Sheet for Patients:  BloggerCourse.com  Fact Sheet for Healthcare Providers:  SeriousBroker.it  This test is no t yet approved or cleared by the [place] FDA and  has been authorized for detection and/or diagnosis of SARS-CoV-2 by FDA under an Emergency Use Authorization (EUA). This EUA will remain  in  effect (meaning this test can be used) for the duration of the COVID-19 declaration under Section 564(b)(1) of the Act, 21 U.S.C.section 360bbb-3(b)(1), unless the authorization is terminated  or revoked sooner.       Influenza A by PCR NEGATIVE NEGATIVE Final   Influenza B by PCR NEGATIVE NEGATIVE Final    Comment: (NOTE) The Xpert Xpress SARS-CoV-2/FLU/RSV plus assay is intended as an aid in the diagnosis of influenza from Nasopharyngeal swab specimens and should not be used as a sole basis for treatment. Nasal washings and aspirates are unacceptable for Xpert Xpress SARS-CoV-2/FLU/RSV testing.  Fact Sheet for Patients: BloggerCourse.com  Fact Sheet for Healthcare Providers: SeriousBroker.it  This test is not yet approved or cleared by the [place] FDA and has been authorized for detection and/or diagnosis of SARS-CoV-2 by FDA under an Emergency Use Authorization (EUA). This EUA will remain in effect (meaning this test can be used) for the duration of the COVID-19 declaration under Section 564(b)(1) of the Act, 21 U.S.C. section 360bbb-3(b)(1), unless the authorization is terminated or revoked.  Performed at [facility], [address] [place], [place] [address]   MRSA PCR Screening     Status: None   Collection Time: 06/22/20  5:33 PM  Result Value Ref Range Status   MRSA by PCR NEGATIVE NEGATIVE Final    Comment:        The GeneXpert MRSA Assay (FDA approved for NASAL specimens only), is one component of a comprehensive MRSA colonization surveillance program. It is not intended to diagnose MRSA infection nor to guide or monitor treatment for MRSA infections. Performed at [facility], [address] [place], [place] [address]      Time coordinating discharge: Over 30 minutes  SIGNED:   Marinda Elk, MD  [facility] 06/24/2020, 8:25 AM Pager   If 7PM-7AM, please contact  night-coverage [email or web address] Password [email or web address]

## 2020-07-26 ENCOUNTER — Emergency Department (HOSPITAL_COMMUNITY)
Admission: EM | Admit: 2020-07-26 | Discharge: 2020-07-26 | Disposition: A | Discharge status: Home / Self Care | Payer: Medicaid Other | Attending: Emergency Medicine | Admitting: Emergency Medicine

## 2020-07-26 ENCOUNTER — Other Ambulatory Visit: Payer: Self-pay

## 2020-07-26 DIAGNOSIS — F17210 Nicotine dependence, cigarettes, uncomplicated: Secondary | ICD-10-CM | POA: Insufficient documentation

## 2020-07-26 DIAGNOSIS — R569 Unspecified convulsions: Secondary | ICD-10-CM | POA: Diagnosis present

## 2020-07-26 LAB — CBC
HCT: 42.1 % (ref 39.0–52.0)
Hemoglobin: 13.8 g/dL (ref 13.0–17.0)
MCH: 29.8 pg (ref 26.0–34.0)
MCHC: 32.8 g/dL (ref 30.0–36.0)
MCV: 90.9 fL (ref 80.0–100.0)
Platelets: 232 10*3/uL (ref 150–400)
RBC: 4.63 MIL/uL (ref 4.22–5.81)
RDW: 15.3 % (ref 11.5–15.5)
WBC: 6 10*3/uL (ref 4.0–10.5)
nRBC: 0 % (ref 0.0–0.2)

## 2020-07-26 LAB — COMPREHENSIVE METABOLIC PANEL
ALT: 9 U/L (ref 0–44)
AST: 16 U/L (ref 15–41)
Albumin: 3.2 g/dL — ABNORMAL LOW (ref 3.5–5.0)
Alkaline Phosphatase: 40 U/L (ref 38–126)
Anion gap: 7 (ref 5–15)
BUN: 12 mg/dL (ref 6–20)
CO2: 28 mmol/L (ref 22–32)
Calcium: 9.4 mg/dL (ref 8.9–10.3)
Chloride: 103 mmol/L (ref 98–111)
Creatinine, Ser: 1.1 mg/dL (ref 0.61–1.24)
GFR, Estimated: >60 mL/min (ref >60)
Glucose, Bld: 106 mg/dL — ABNORMAL HIGH (ref 70–99)
Potassium: 3.6 mmol/L (ref 3.5–5.1)
Sodium: 138 mmol/L (ref 135–145)
Total Bilirubin: 0.6 mg/dL (ref 0.3–1.2)
Total Protein: 6.9 g/dL (ref 6.5–8.1)

## 2020-07-26 LAB — VALPROIC ACID LEVEL: Valproic Acid Lvl: <10 ug/mL — ABNORMAL LOW (ref 50.0–100.0)

## 2020-07-26 MED ORDER — DIVALPROEX SODIUM 250 MG PO DR TAB
500.0000 mg | DELAYED_RELEASE_TABLET | Freq: Once | ORAL | Status: AC
  Administered 2020-07-26: 500 mg via ORAL
  Filled 2020-07-26: qty 2

## 2020-07-26 MED ORDER — LORAZEPAM 2 MG/ML IJ SOLN
1.0000 mg | Freq: Once | INTRAMUSCULAR | Status: AC
  Administered 2020-07-26: 1 mg via INTRAVENOUS
  Filled 2020-07-26: qty 1

## 2020-07-26 NOTE — ED Notes (Signed)
Contacted pt care taker Harvie Heck and said that he could be here to pick up pt in about an hour. Randy number [phone]. Will make charge aware.

## 2020-07-26 NOTE — ED Provider Notes (Signed)
[facility]Y [facility]PT [facility] Emergency Department Provider Note MRN:  [id number]  Arrival date & time: 07/26/20     Chief Complaint   Seizures   History of Present Illness   Daniel Foley is a 50 y.o. year-old male with a history of schizophrenia, seizure disorder presenting to the ED with chief complaint of seizure.  Seizure this evening lasting 30 seconds.  Has been compliant with medications per group home.  Patient denies any pain or symptoms at this time.  I was unable to obtain an accurate HPI, PMH, or ROS due to the patient's baseline cognitive impairment.  Level 5 caveat.  Review of Systems  Positive for seizure.  Patient's Health History    Past Medical History:  Diagnosis Date   Schizophrenia (HCC)     No past surgical history on file.  No family history on file.  Social History   Socioeconomic History   Marital status: Unknown    Spouse name: Not on file   Number of children: Not on file   Years of education: Not on file   Highest education level: Not on file  Occupational History   Not on file  Tobacco Use   Smoking status: Every Day    Types: Cigarettes   Smokeless tobacco: Never  Substance and Sexual Activity   Alcohol use: Not Currently   Drug use: Not Currently   Sexual activity: Not Currently  Other Topics Concern   Not on file  Social History Narrative   Not on file   Social Determinants of Health   Financial Resource Strain: Not on file  Food Insecurity: Not on file  Transportation Needs: Not on file  Physical Activity: Not on file  Stress: Not on file  Social Connections: Not on file  Intimate Partner Violence: Not on file     Physical Exam   Vitals:   07/26/20 0330 07/26/20 0430  BP: 108/78 104/75  Pulse: (!) 58 (!) 55  Resp: 14 14  Temp:    SpO2: 97% 97%    CONSTITUTIONAL: Well-appearing, NAD NEURO: Alert, interactive, follows commands, moves all extremities equally EYES:  eyes equal and reactive ENT/NECK:  no  LAD, no JVD CARDIO: Regular rate, well-perfused, normal S1 and S2 PULM:  CTAB no wheezing or rhonchi GI/GU:  normal bowel sounds, non-distended, non-tender MSK/SPINE:  No gross deformities, no edema SKIN:  no rash, atraumatic PSYCH:  Appropriate speech and behavior  *Additional and/or pertinent findings included in MDM below  Diagnostic and Interventional Summary    EKG Interpretation  Date/Time:  Thursday July 26 2020 02:41:57 EDT Ventricular Rate:  63 PR Interval:  156 QRS Duration: 108 QT Interval:  397 QTC Calculation: 407 R Axis:   -28 Text Interpretation: Sinus rhythm Borderline left axis deviation Borderline T abnormalities, inferior leads Borderline ST elevation, lateral leads Confirmed by Kennis Carina [phone]) on 07/26/2020 2:51:21 AM       Labs Reviewed  COMPREHENSIVE METABOLIC PANEL - Abnormal; Notable for the following components:      Result Value   Glucose, Bld 106 (*)    Albumin 3.2 (*)    All other components within normal limits  CBC  VALPROIC ACID LEVEL    No orders to display    Medications  LORazepam (ATIVAN) injection 1 mg (1 mg Intravenous Given 07/26/20 0256)     Procedures  /  Critical Care Procedures  ED Course and Medical Decision Making  I have reviewed the triage vital signs, the nursing notes, and pertinent  available records from the EMR.  Listed above are laboratory and imaging tests that I personally ordered, reviewed, and interpreted and then considered in my medical decision making (see below for details).  Seizure, known seizure disorder, providing small dose Ativan to prevent further seizure activity, checking basic labs, anticipating discharge.     Patient remains at his neurological baseline, easily wakes, talks, no focal deficits.  No indication for CNS imaging.  Labs reassuring, appropriate for discharge.  Elmer Sow. Pilar Plate, MD [facility] Health Emergency Medicine [facility] [email or web address]  Final Clinical  Impressions(s) / ED Diagnoses     ICD-10-CM   1. Seizure (HCC)  R56.9       ED Discharge Orders     None        Discharge Instructions Discussed with and Provided to Patient:     Discharge Instructions      You were evaluated in the Emergency Department and after careful evaluation, we did not find any emergent condition requiring admission or further testing in the hospital.  Your exam/testing today was overall reassuring.  Recommend close follow-up with your regular prescriber to discuss your antiseizure medications.  Please take them as directed.  Please return to the Emergency Department if you experience any worsening of your condition.  Thank you for allowing [place] to be a part of your care.         Sabas Sous, MD 07/26/20 [phone]

## 2020-07-26 NOTE — ED Triage Notes (Signed)
BIB from group home with a care taker.  Hx: seizures and diabetes. Another staff member witnessed a 30 second seizure. Reports that pt hasn't missed any doses of medication. Confused and tremor at baseline. Did have urinal incont. And was post itical when EMS arrived. Pt now at baseline. Walked with assistance.  Last seizure was about 1 month ago.   CBG 89 50 heart rate 114/86 98% room air

## 2020-07-26 NOTE — Discharge Instructions (Addendum)
You were evaluated in the Emergency Department and after careful evaluation, we did not find any emergent condition requiring admission or further testing in the hospital.  Your exam/testing today was overall reassuring.  Recommend close follow-up with your regular prescriber to discuss your antiseizure medications.  Please take them as directed.  Please return to the Emergency Department if you experience any worsening of your condition.  Thank you for allowing [place] to be a part of your care.

## 2020-07-26 NOTE — ED Notes (Signed)
Patient/caregiver verbalizes understanding of discharge instructions. Opportunity for questioning and answers were provided. Armband removed by staff, pt discharged from ED via wheelchair. Pt wheeled out to the car and left with Harvie Heck, caregiver.

## 2020-08-14 ENCOUNTER — Emergency Department (HOSPITAL_COMMUNITY): Payer: Medicaid Other

## 2020-08-14 ENCOUNTER — Observation Stay (HOSPITAL_COMMUNITY)
Admission: EM | Admit: 2020-08-14 | Discharge: 2020-08-16 | Disposition: A | Discharge status: Home / Self Care | Payer: Medicaid Other | Attending: Emergency Medicine | Admitting: Emergency Medicine

## 2020-08-14 DIAGNOSIS — E878 Other disorders of electrolyte and fluid balance, not elsewhere classified: Secondary | ICD-10-CM | POA: Diagnosis not present

## 2020-08-14 DIAGNOSIS — F17210 Nicotine dependence, cigarettes, uncomplicated: Secondary | ICD-10-CM | POA: Insufficient documentation

## 2020-08-14 DIAGNOSIS — E11649 Type 2 diabetes mellitus with hypoglycemia without coma: Secondary | ICD-10-CM | POA: Insufficient documentation

## 2020-08-14 DIAGNOSIS — R001 Bradycardia, unspecified: Secondary | ICD-10-CM | POA: Diagnosis present

## 2020-08-14 DIAGNOSIS — E871 Hypo-osmolality and hyponatremia: Secondary | ICD-10-CM | POA: Diagnosis not present

## 2020-08-14 DIAGNOSIS — R569 Unspecified convulsions: Principal | ICD-10-CM | POA: Insufficient documentation

## 2020-08-14 DIAGNOSIS — Z720 Tobacco use: Secondary | ICD-10-CM | POA: Diagnosis present

## 2020-08-14 DIAGNOSIS — Z20822 Contact with and (suspected) exposure to covid-19: Secondary | ICD-10-CM | POA: Diagnosis not present

## 2020-08-14 DIAGNOSIS — Y900 Blood alcohol level of less than 20 mg/100 ml: Secondary | ICD-10-CM | POA: Diagnosis not present

## 2020-08-14 DIAGNOSIS — Z79899 Other long term (current) drug therapy: Secondary | ICD-10-CM | POA: Insufficient documentation

## 2020-08-14 DIAGNOSIS — R079 Chest pain, unspecified: Secondary | ICD-10-CM | POA: Diagnosis present

## 2020-08-14 DIAGNOSIS — Z7984 Long term (current) use of oral hypoglycemic drugs: Secondary | ICD-10-CM | POA: Diagnosis not present

## 2020-08-14 DIAGNOSIS — F209 Schizophrenia, unspecified: Secondary | ICD-10-CM | POA: Diagnosis present

## 2020-08-14 LAB — COMPREHENSIVE METABOLIC PANEL
ALT: 9 U/L (ref 0–44)
AST: 13 U/L — ABNORMAL LOW (ref 15–41)
Albumin: 3.3 g/dL — ABNORMAL LOW (ref 3.5–5.0)
Alkaline Phosphatase: 37 U/L — ABNORMAL LOW (ref 38–126)
Anion gap: 7 (ref 5–15)
BUN: 5 mg/dL — ABNORMAL LOW (ref 6–20)
CO2: 27 mmol/L (ref 22–32)
Calcium: 9.3 mg/dL (ref 8.9–10.3)
Chloride: 88 mmol/L — ABNORMAL LOW (ref 98–111)
Creatinine, Ser: 0.86 mg/dL (ref 0.61–1.24)
GFR, Estimated: >60 mL/min (ref >60)
Glucose, Bld: 80 mg/dL (ref 70–99)
Potassium: 4.3 mmol/L (ref 3.5–5.1)
Sodium: 122 mmol/L — ABNORMAL LOW (ref 135–145)
Total Bilirubin: 0.4 mg/dL (ref 0.3–1.2)
Total Protein: 6.9 g/dL (ref 6.5–8.1)

## 2020-08-14 LAB — CBC WITH DIFFERENTIAL/PLATELET
Abs Immature Granulocytes: 0.01 10*3/uL (ref 0.00–0.07)
Basophils Absolute: 0 10*3/uL (ref 0.0–0.1)
Basophils Relative: 0 %
Eosinophils Absolute: 0.1 10*3/uL (ref 0.0–0.5)
Eosinophils Relative: 2 %
HCT: 38.4 % — ABNORMAL LOW (ref 39.0–52.0)
Hemoglobin: 13.2 g/dL (ref 13.0–17.0)
Immature Granulocytes: 0 %
Lymphocytes Relative: 49 %
Lymphs Abs: 2.6 10*3/uL (ref 0.7–4.0)
MCH: 29.9 pg (ref 26.0–34.0)
MCHC: 34.4 g/dL (ref 30.0–36.0)
MCV: 86.9 fL (ref 80.0–100.0)
Monocytes Absolute: 0.4 10*3/uL (ref 0.1–1.0)
Monocytes Relative: 7 %
Neutro Abs: 2.3 10*3/uL (ref 1.7–7.7)
Neutrophils Relative %: 42 %
Platelets: 162 10*3/uL (ref 150–400)
RBC: 4.42 MIL/uL (ref 4.22–5.81)
RDW: 13.3 % (ref 11.5–15.5)
WBC: 5.4 10*3/uL (ref 4.0–10.5)
nRBC: 0 % (ref 0.0–0.2)

## 2020-08-14 LAB — RAPID URINE DRUG SCREEN, HOSP PERFORMED
Amphetamines: NOT DETECTED
Barbiturates: NOT DETECTED
Benzodiazepines: NOT DETECTED
Cocaine: NOT DETECTED
Opiates: NOT DETECTED
Tetrahydrocannabinol: NOT DETECTED

## 2020-08-14 LAB — ACETAMINOPHEN LEVEL: Acetaminophen (Tylenol), Serum: <10 ug/mL — ABNORMAL LOW (ref 10–30)

## 2020-08-14 LAB — MAGNESIUM: Magnesium: 1.4 mg/dL — ABNORMAL LOW (ref 1.7–2.4)

## 2020-08-14 LAB — ETHANOL: Alcohol, Ethyl (B): <10 mg/dL (ref <10)

## 2020-08-14 LAB — VALPROIC ACID LEVEL: Valproic Acid Lvl: 52 ug/mL (ref 50.0–100.0)

## 2020-08-14 MED ORDER — LORAZEPAM 2 MG/ML IJ SOLN
1.0000 mg | Freq: Once | INTRAMUSCULAR | Status: AC
  Administered 2020-08-14: 1 mg via INTRAMUSCULAR
  Filled 2020-08-14: qty 1

## 2020-08-14 MED ORDER — SODIUM CHLORIDE 0.9 % IV BOLUS
1000.0000 mL | Freq: Once | INTRAVENOUS | Status: AC
  Administered 2020-08-14: 1000 mL via INTRAVENOUS

## 2020-08-14 MED ORDER — MAGNESIUM SULFATE 2 GM/50ML IV SOLN
2.0000 g | Freq: Once | INTRAVENOUS | Status: AC
  Administered 2020-08-14: 2 g via INTRAVENOUS
  Filled 2020-08-14: qty 50

## 2020-08-14 NOTE — ED Provider Notes (Signed)
Emergency Medicine Provider Triage Evaluation Note  Daniel Foley , a 50 y.o. male  was evaluated in triage.  Pt complains of new history of seizure, seizure-like activity approximately lasting 10 minutes, reports he does not remember the episode occurring.  Also feels somewhat sluggish with no headache and states that he is back to baseline.  No sick symptoms.  Review of Systems  Positive: seizure Negative: Chest pain, shortness of breath , cough   Physical Exam  BP 103/84 (BP Location: Left Arm)   Pulse 65   Temp 98.6 F (37 C) (Oral)   Resp 18   SpO2 100%  Gen:   Awake, no distress   Resp:  Normal effort  MSK:   Moves extremities without difficulty  Other:    Medical Decision Making  Medically screening exam initiated at 8:49 PM.  Appropriate orders placed.  Ovide Dusek was informed that the remainder of the evaluation will be completed by another provider, this initial triage assessment does not replace that evaluation, and the importance of remaining in the ED until their evaluation is complete.     Claude Manges, PA-C 08/14/20 2051    Margarita Grizzle, MD 08/14/20 2258

## 2020-08-14 NOTE — ED Provider Notes (Signed)
[facility] EMERGENCY DEPARTMENT Provider Note   CSN: [id number] Arrival date & time: 08/14/20  1913     History Chief Complaint  Patient presents with   poss seizure   LEVEL 5 CAVEAT 2/2 PSYCHIATRIC DISORDER  Javier Gell is a 50 y.o. male.  50 y/o male with hx of type 2 diabetes mellitus, HTN, schizophrenia, seizure like activity, and anti-Parkinson's symptoms presents from group home after reported seizure like activity. Seizure lasted for approximately 10 minutes. Patient amnestic to the event. No c/o headache, chest pain, SOB. A&Ox2 at baseline. Caretaker reported compliance with all medications.  The history is provided by the patient. No language interpreter was used.      Past Medical History:  Diagnosis Date   Schizophrenia [facility])     Patient Active Problem List   Diagnosis Date Noted   Seizures (HCC) 08/15/2020   Observed seizure-like activity (HCC) 06/22/2020   Witnessed seizure-like activity (HCC) 06/21/2020   Seizure (HCC) 06/21/2020    No past surgical history on file.     No family history on file.  Social History   Tobacco Use   Smoking status: Every Day    Types: Cigarettes   Smokeless tobacco: Never  Substance Use Topics   Alcohol use: Not Currently   Drug use: Not Currently    Home Medications Prior to Admission medications   Medication Sig Start Date End Date Taking? Authorizing Provider  Accu-Chek Softclix Lancets lancets 1 each by Other route as directed. For blood sugar check    [provider]  albuterol (VENTOLIN HFA) 108 (90 Base) MCG/ACT inhaler Inhale 2 puffs into the lungs every 4 (four) hours as needed for shortness of breath.    [provider]  benztropine (COGENTIN) 1 MG tablet Take 1 mg by mouth daily.    [provider]  budesonide-formoterol (SYMBICORT) 160-4.5 MCG/ACT inhaler Inhale 2 puffs into the lungs 2 (two) times daily.    [provider]  cholecalciferol  (VITAMIN D) 25 MCG (1000 UNIT) tablet Take 1,000 Units by mouth daily.    [provider]  dapagliflozin propanediol (FARXIGA) 10 MG TABS tablet Take 10 mg by mouth daily.    [provider]  Deutetrabenazine (AUSTEDO) 6 MG TABS Take 6 mg by mouth in the morning and at bedtime.    [provider]  divalproex (DEPAKOTE) 500 MG DR tablet Take 1,000 mg by mouth 2 (two) times daily.    [provider]  ferrous sulfate 325 (65 FE) MG tablet Take 325 mg by mouth daily with breakfast.    [provider]  Finerenone (KERENDIA) 10 MG TABS Take 10 mg by mouth daily.    [provider]  glucose blood test strip 1 each by Other route as needed for other (for blood sugar). Use as instructed    [provider]  haloperidol (HALDOL) 5 MG tablet Take 5 mg by mouth daily.    [provider]  Lacosamide 150 MG TABS Take 150 mg by mouth 2 (two) times daily.    [provider]  levETIRAcetam (KEPPRA) 500 MG tablet Take 500 mg by mouth 2 (two) times daily.    [provider]  nicotine (NICODERM CQ - DOSED IN MG/24 HOURS) 14 mg/24hr patch Place 14 mg onto the skin daily.    [provider]  rosuvastatin (CRESTOR) 20 MG tablet Take 20 mg by mouth daily.    [provider]  Semaglutide (RYBELSUS) 7 MG TABS  Take 7 mg by mouth daily.    [provider]  Tiotropium Bromide Monohydrate (SPIRIVA RESPIMAT) 1.25 MCG/ACT AERS Inhale 2 puffs into the lungs daily.    [provider]    Allergies    Contrast media [iodinated diagnostic agents]  Review of Systems   Review of Systems  Unable to perform ROS: Psychiatric disorder    Physical Exam Updated Vital Signs BP 94/73   Pulse 73   Temp 98.6 F (37 C) (Oral)   Resp 12   SpO2 98%   Physical Exam Vitals and nursing note reviewed.  Constitutional:      General: He is not in acute distress.    Appearance: He is well-developed. He is not  diaphoretic.     Comments: Patient nontoxic appearing  HENT:     Head: Normocephalic and atraumatic.  Eyes:     General: No scleral icterus.    Conjunctiva/sclera: Conjunctivae normal.  Neck:     Comments: No meningismus  Cardiovascular:     Rate and Rhythm: Normal rate and regular rhythm.     Pulses: Normal pulses.  Pulmonary:     Effort: Pulmonary effort is normal. No respiratory distress.     Comments: Lungs CTAB. Respirations even and unlabored. Musculoskeletal:        General: Normal range of motion.     Cervical back: Normal range of motion.  Skin:    General: Skin is warm and dry.     Coloration: Skin is not pale.     Findings: No erythema or rash.  Neurological:     Mental Status: He is alert.     Comments: Alert, but with blank stare and some rhythmic shaking from side to side of his head. Some rhythmic shaking of the RUE, but also making purposeful movements such as scratching his L shin. Makes eye contact with verbal stimuli and will track, but will not answer questions. No facial droop appreciated.  Psychiatric:        Behavior: Behavior normal.    ED Results / Procedures / Treatments   Labs (all labs ordered are listed, but only abnormal results are displayed) Labs Reviewed  CBC WITH DIFFERENTIAL/PLATELET - Abnormal; Notable for the following components:      Result Value   HCT 38.4 (*)    All other components within normal limits  COMPREHENSIVE METABOLIC PANEL - Abnormal; Notable for the following components:   Sodium 122 (*)    Chloride 88 (*)    BUN 5 (*)    Albumin 3.3 (*)    AST 13 (*)    Alkaline Phosphatase 37 (*)    All other components within normal limits  MAGNESIUM - Abnormal; Notable for the following components:   Magnesium 1.4 (*)    All other components within normal limits  ACETAMINOPHEN LEVEL - Abnormal; Notable for the following components:   Acetaminophen (Tylenol), Serum <10 (*)    All other components within normal limits  SARS  CORONAVIRUS 2 (TAT 6-24 HRS)  ETHANOL  RAPID URINE DRUG SCREEN, HOSP PERFORMED  VALPROIC ACID LEVEL  LEVETIRACETAM LEVEL  CBG MONITORING, ED    EKG None  Radiology DG Chest 2 View  Result Date: 08/14/2020 CLINICAL DATA:  Seizure, seizure-like activity lasting 10 minutes EXAM: CHEST - 2 VIEW COMPARISON:  None. FINDINGS: The heart size and mediastinal contours are within normal limits. Both lungs are clear. The visualized skeletal structures are unremarkable. IMPRESSION: No active cardiopulmonary disease. Electronically Signed   By:  Kreg Shropshire M.D.   On: 08/14/2020 21:21    Procedures Procedures   Medications Ordered in ED Medications  magnesium sulfate IVPB 2 g 50 mL (0 g Intravenous Stopped 08/14/20 2334)  sodium chloride 0.9 % bolus 1,000 mL (0 mLs Intravenous Stopped 08/14/20 2334)  LORazepam (ATIVAN) injection 1 mg (1 mg Intramuscular Given 08/14/20 2239)    ED Course  I have reviewed the triage vital signs and the nursing notes.  Pertinent labs & imaging results that were available during my care of the patient were reviewed by me and considered in my medical decision making (see chart for details).  Clinical Course as of 08/15/20 0443  Tue Aug 14, 2020  2231 Patient with some rhythmic shaking of head and R arm. No gaze preference. Intermittently directable, but nonverbal; will not answer questions. Symptoms escalating, per RN. Will give IM Ativan. [KH]  2234 Call placed to [facility], patient's caretaker. Number listed as [phone]. Care taker states that patient had a seizure PTA. States patient usually shakes, but his whole body was shaking and his eyes rolled back. Breathing was abnormal and patient was biting tongue. Reports 1 hour for patient to return to baseline. Care taker reports usually easily responsive at baseline; answers yes/no questions. Confirms medication compliance.  [KH]  2306 Patient alert, conversant, but pulled out PIV. RN to obtain new access. [KH]     Clinical Course User Index [KH] Darylene Price   MDM Rules/Calculators/A&P                           50 year old male presents to the emergency department from a group home after seizure-like activity.  His caregiver reports that he has been compliant with his seizure medications.  His Depakote level is therapeutic today.  Had questionable episode of seizure-like movements while in the emergency department.  Given Ativan for this.    Seizure threshold may be lowered by electrolyte derangements including hyponatremia, hypochloremia, hypomagnesemia.  He was given IV fluids as well as 2 g of IV magnesium for repletion, though case was discussed with neurology who advises admission for electrolyte correction.  Case discussed with Dr. Loney Loh of [facility] who will admit.   Final Clinical Impression(s) / ED Diagnoses Final diagnoses:  Hyponatremia  Hypochloremia  Hypomagnesemia  Seizure-like activity [facility])    Rx / DC Orders ED Discharge Orders     None        Antony Madura, PA-C 08/15/20 2595    Geoffery Lyons, MD 08/15/20 2356

## 2020-08-14 NOTE — ED Triage Notes (Signed)
Pt from group home, hx schizophrenia, tremor & new hx seizure. Compliant w all medication. Caretaker said seizure-like activity for approx , on fire arrival, pt up & making bed. On EMS arrival, pt ambulating on porch, agitated.  A&O2, at baseline  VSS

## 2020-08-15 ENCOUNTER — Encounter (HOSPITAL_COMMUNITY): Payer: Self-pay | Admitting: Internal Medicine

## 2020-08-15 DIAGNOSIS — R079 Chest pain, unspecified: Secondary | ICD-10-CM | POA: Diagnosis not present

## 2020-08-15 DIAGNOSIS — R569 Unspecified convulsions: Secondary | ICD-10-CM

## 2020-08-15 DIAGNOSIS — E11649 Type 2 diabetes mellitus with hypoglycemia without coma: Secondary | ICD-10-CM | POA: Diagnosis present

## 2020-08-15 DIAGNOSIS — Z720 Tobacco use: Secondary | ICD-10-CM | POA: Diagnosis present

## 2020-08-15 DIAGNOSIS — F209 Schizophrenia, unspecified: Secondary | ICD-10-CM

## 2020-08-15 DIAGNOSIS — R001 Bradycardia, unspecified: Secondary | ICD-10-CM | POA: Diagnosis not present

## 2020-08-15 HISTORY — DX: Chest pain, unspecified: R07.9

## 2020-08-15 LAB — BASIC METABOLIC PANEL
Anion gap: 6 (ref 5–15)
Anion gap: 6 (ref 5–15)
Anion gap: 9 (ref 5–15)
Anion gap: 8 (ref 5–15)
BUN: 8 mg/dL (ref 6–20)
BUN: 7 mg/dL (ref 6–20)
BUN: 9 mg/dL (ref 6–20)
BUN: 9 mg/dL (ref 6–20)
CO2: 27 mmol/L (ref 22–32)
CO2: 28 mmol/L (ref 22–32)
CO2: 24 mmol/L (ref 22–32)
CO2: 24 mmol/L (ref 22–32)
Calcium: 9.7 mg/dL (ref 8.9–10.3)
Calcium: 9.5 mg/dL (ref 8.9–10.3)
Calcium: 9.8 mg/dL (ref 8.9–10.3)
Calcium: 9.6 mg/dL (ref 8.9–10.3)
Chloride: 101 mmol/L (ref 98–111)
Chloride: 102 mmol/L (ref 98–111)
Chloride: 103 mmol/L (ref 98–111)
Chloride: 103 mmol/L (ref 98–111)
Creatinine, Ser: 1.01 mg/dL (ref 0.61–1.24)
Creatinine, Ser: 1.09 mg/dL (ref 0.61–1.24)
Creatinine, Ser: 1 mg/dL (ref 0.61–1.24)
Creatinine, Ser: 1 mg/dL (ref 0.61–1.24)
GFR, Estimated: >60 mL/min (ref >60)
GFR, Estimated: >60 mL/min (ref >60)
GFR, Estimated: >60 mL/min (ref >60)
GFR, Estimated: >60 mL/min (ref >60)
Glucose, Bld: 93 mg/dL (ref 70–99)
Glucose, Bld: 108 mg/dL — ABNORMAL HIGH (ref 70–99)
Glucose, Bld: 93 mg/dL (ref 70–99)
Glucose, Bld: 107 mg/dL — ABNORMAL HIGH (ref 70–99)
Potassium: 4.8 mmol/L (ref 3.5–5.1)
Potassium: 4.3 mmol/L (ref 3.5–5.1)
Potassium: 4.3 mmol/L (ref 3.5–5.1)
Potassium: 4.5 mmol/L (ref 3.5–5.1)
Sodium: 134 mmol/L — ABNORMAL LOW (ref 135–145)
Sodium: 136 mmol/L (ref 135–145)
Sodium: 136 mmol/L (ref 135–145)
Sodium: 135 mmol/L (ref 135–145)

## 2020-08-15 LAB — CBG MONITORING, ED
Glucose-Capillary: 110 mg/dL — ABNORMAL HIGH (ref 70–99)
Glucose-Capillary: 113 mg/dL — ABNORMAL HIGH (ref 70–99)
Glucose-Capillary: 76 mg/dL (ref 70–99)
Glucose-Capillary: 82 mg/dL (ref 70–99)

## 2020-08-15 LAB — GLUCOSE, CAPILLARY: Glucose-Capillary: 139 mg/dL — ABNORMAL HIGH (ref 70–99)

## 2020-08-15 LAB — URINALYSIS, ROUTINE W REFLEX MICROSCOPIC
Bacteria, UA: NONE SEEN
Bilirubin Urine: NEGATIVE
Glucose, UA: NEGATIVE mg/dL
Ketones, ur: NEGATIVE mg/dL
Leukocytes,Ua: NEGATIVE
Nitrite: NEGATIVE
Protein, ur: NEGATIVE mg/dL
Specific Gravity, Urine: 1.004 — ABNORMAL LOW (ref 1.005–1.030)
pH: 8 (ref 5.0–8.0)

## 2020-08-15 LAB — OSMOLALITY, URINE: Osmolality, Ur: 206 mOsm/kg — ABNORMAL LOW (ref 300–900)

## 2020-08-15 LAB — SODIUM, URINE, RANDOM: Sodium, Ur: 46 mmol/L

## 2020-08-15 LAB — TROPONIN I (HIGH SENSITIVITY)
Troponin I (High Sensitivity): 7 ng/L (ref <18)
Troponin I (High Sensitivity): 6 ng/L (ref <18)
Troponin I (High Sensitivity): 6 ng/L (ref <18)

## 2020-08-15 LAB — SARS CORONAVIRUS 2 (TAT 6-24 HRS): SARS Coronavirus 2: NEGATIVE

## 2020-08-15 MED ORDER — DEXTROSE 50 % IV SOLN: 1.0000 | INTRAVENOUS | Status: DC | PRN

## 2020-08-15 MED ORDER — NICOTINE 21 MG/24HR TD PT24
21.0000 mg | MEDICATED_PATCH | Freq: Every day | TRANSDERMAL | Status: DC
  Administered 2020-08-15 – 2020-08-16 (×2): 21 mg via TRANSDERMAL
  Filled 2020-08-15 (×2): qty 1

## 2020-08-15 MED ORDER — HALOPERIDOL 5 MG PO TABS
5.0000 mg | ORAL_TABLET | Freq: Every day | ORAL | Status: DC
  Administered 2020-08-15 – 2020-08-16 (×2): 5 mg via ORAL
  Filled 2020-08-15 (×2): qty 1

## 2020-08-15 MED ORDER — LEVETIRACETAM 500 MG PO TABS
500.0000 mg | ORAL_TABLET | Freq: Two times a day (BID) | ORAL | Status: DC
  Administered 2020-08-15 – 2020-08-16 (×4): 500 mg via ORAL
  Filled 2020-08-15 (×4): qty 1

## 2020-08-15 MED ORDER — ACETAMINOPHEN 325 MG PO TABS: 650.0000 mg | ORAL_TABLET | ORAL | Status: DC | PRN

## 2020-08-15 MED ORDER — ENOXAPARIN SODIUM 40 MG/0.4ML IJ SOSY
40.0000 mg | PREFILLED_SYRINGE | INTRAMUSCULAR | Status: DC
  Administered 2020-08-15 – 2020-08-16 (×2): 40 mg via SUBCUTANEOUS
  Filled 2020-08-15 (×2): qty 0.4

## 2020-08-15 MED ORDER — TIOTROPIUM BROMIDE MONOHYDRATE 1.25 MCG/ACT IN AERS: 2.0000 | INHALATION_SPRAY | Freq: Every day | RESPIRATORY_TRACT | Status: DC

## 2020-08-15 MED ORDER — UMECLIDINIUM BROMIDE 62.5 MCG/INH IN AEPB
1.0000 | INHALATION_SPRAY | Freq: Every day | RESPIRATORY_TRACT | Status: DC
  Filled 2020-08-15 (×2): qty 7

## 2020-08-15 MED ORDER — MOMETASONE FURO-FORMOTEROL FUM 200-5 MCG/ACT IN AERO
2.0000 | INHALATION_SPRAY | Freq: Two times a day (BID) | RESPIRATORY_TRACT | Status: DC
  Administered 2020-08-16: 2 via RESPIRATORY_TRACT
  Filled 2020-08-15 (×2): qty 8.8

## 2020-08-15 MED ORDER — CHLORHEXIDINE GLUCONATE 0.12% ORAL RINSE (MEDLINE KIT)
15.0000 mL | Freq: Two times a day (BID) | OROMUCOSAL | Status: DC
  Administered 2020-08-16: 15 mL via OROMUCOSAL

## 2020-08-15 MED ORDER — ORAL CARE MOUTH RINSE
15.0000 mL | OROMUCOSAL | Status: DC
  Administered 2020-08-15 – 2020-08-16 (×13): 15 mL via OROMUCOSAL

## 2020-08-15 MED ORDER — ROSUVASTATIN CALCIUM 20 MG PO TABS
20.0000 mg | ORAL_TABLET | Freq: Every day | ORAL | Status: DC
  Administered 2020-08-15 – 2020-08-16 (×2): 20 mg via ORAL
  Filled 2020-08-15 (×3): qty 1

## 2020-08-15 MED ORDER — INSULIN ASPART 100 UNIT/ML IJ SOLN
0.0000 [IU] | Freq: Three times a day (TID) | INTRAMUSCULAR | Status: DC
  Administered 2020-08-16: 1 [IU] via SUBCUTANEOUS

## 2020-08-15 MED ORDER — DICLOFENAC SODIUM 1 % EX GEL
2.0000 g | Freq: Two times a day (BID) | CUTANEOUS | Status: DC | PRN
  Filled 2020-08-15: qty 100

## 2020-08-15 MED ORDER — LORAZEPAM 2 MG/ML IJ SOLN: 1.0000 mg | INTRAMUSCULAR | Status: DC | PRN

## 2020-08-15 MED ORDER — BENZTROPINE MESYLATE 1 MG PO TABS
1.0000 mg | ORAL_TABLET | Freq: Every day | ORAL | Status: DC
  Administered 2020-08-15 – 2020-08-16 (×2): 1 mg via ORAL
  Filled 2020-08-15 (×2): qty 1

## 2020-08-15 MED ORDER — DEUTETRABENAZINE 6 MG PO TABS: 6.0000 mg | ORAL_TABLET | Freq: Two times a day (BID) | ORAL | Status: DC

## 2020-08-15 MED ORDER — LACOSAMIDE 50 MG PO TABS
150.0000 mg | ORAL_TABLET | Freq: Two times a day (BID) | ORAL | Status: DC
  Administered 2020-08-15 – 2020-08-16 (×4): 150 mg via ORAL
  Filled 2020-08-15 (×4): qty 3

## 2020-08-15 MED ORDER — ACETAMINOPHEN 650 MG RE SUPP: 650.0000 mg | RECTAL | Status: DC | PRN

## 2020-08-15 MED ORDER — ALBUTEROL SULFATE (2.5 MG/3ML) 0.083% IN NEBU: 2.5000 mg | INHALATION_SOLUTION | Freq: Four times a day (QID) | RESPIRATORY_TRACT | Status: DC | PRN

## 2020-08-15 MED ORDER — DIVALPROEX SODIUM 250 MG PO DR TAB
1000.0000 mg | DELAYED_RELEASE_TABLET | Freq: Two times a day (BID) | ORAL | Status: DC
  Administered 2020-08-15 – 2020-08-16 (×4): 1000 mg via ORAL
  Filled 2020-08-15 (×4): qty 4

## 2020-08-15 MED ORDER — SODIUM CHLORIDE 0.9 % IV SOLN
75.0000 mL/h | INTRAVENOUS | Status: DC
  Administered 2020-08-15: 75 mL/h via INTRAVENOUS

## 2020-08-15 NOTE — ED Notes (Signed)
Pt ambulatory to and from the restroom without assistance. 

## 2020-08-15 NOTE — H&P (Signed)
History and Physical    Daniel GaultGerald Riches ZOX:[id number] DOB: 05/02/1970 DOA: 08/14/2020  Referring MD/NP/PA: John GiovanniVasundhra Rathore, MD PCP: Pcp, No  Patient coming from: Group home via EMS  Chief Complaint: Seizure  I have personally briefly reviewed patient's [facility]   HPI: Daniel GaultGerald Foley is a 50 y.o. male with medical history significant of schizophrenia with anti-parkinsonian symptoms, seizure disorder, hyperlipidemia, tobacco abuse, diabetes mellitus type 2 presents after having seizure activity lasting approximately 10 minutes.  Patient had no recollection of the events, and states that the last thing he recalls was having the use the bathroom.  [place], but has been staying here in a group home.  Patient reports that he has been taking his anti-seizure medications as advised because the staff give them to him.  He did note that the person who used to check his blood sugars is no longer there, and that his blood sugars have not been recently checked last couple weeks.  Patient did report having some chest discomfort when he is getting up and moving around.  He denies having any recent fever, chills, nausea, vomiting, diarrhea, dysuria, cough, or shortness of breath.  His caregiver notes that the patient still smokes cigarettes regularly.    Of note patient had been admitted back in June after having witnessed seizure.  He underwent MRI and EEG which showed no acute abnormalities at that time and urine drug screen was negative.  Patient denies any alcohol or illicit drug use.  At that time it was suspected patient had seizure secondary to noncompliance with medications, but that seems less likely as group home staff give the patient his medications regularly.  ED Course: Upon admission into the emergency department patient was seen to be afebrile, pulse 49-92, respirations 10-25, and all other vital signs relatively maintained.  Labs significant for  sodium 122, chloride 88, glucose 80, magnesium 1.4, alcohol level undetectable, and valproic acid level 52.  UDS was negative.  Chest x-ray noted no acute abnormality. He was given Ativan 1 mg IV, magnesium sulfate 2 g IV, and 1 L normal saline IV fluids.  Case had been discussed with neurology who suspected electrolyte abnormalities as the possible cause of symptoms and recommended continuation of current antiseizure medication regimen.  TR[facility] called to admit.  Review of Systems  Constitutional:  Negative for fever and malaise/fatigue.  Eyes:  Negative for photophobia.  Respiratory:  Negative for cough.   Cardiovascular:  Positive for chest pain. Negative for leg swelling.  Gastrointestinal:  Negative for abdominal pain, nausea and vomiting.  Genitourinary:  Negative for dysuria and flank pain.  Neurological:  Positive for seizures and loss of consciousness.  Psychiatric/Behavioral:  Positive for substance abuse.   All other systems reviewed and are negative.  Past Medical History:  Diagnosis Date   Schizophrenia (HCC)     No past surgical history on file.   reports that he has been smoking cigarettes. He has never used smokeless tobacco. He reports previous alcohol use. He reports previous drug use.  Allergies  Allergen Reactions   Contrast Media [Iodinated Diagnostic Agents] Hives and Rash    Patient had contrast in MRI, arrived back to room with redness, rash and hives on his face. Reports burning, no itching.    Family History  Problem Relation Age of Onset   Seizures Brother     Prior to Admission medications   Medication Sig Start Date End Date Taking? Authorizing Provider  Accu-Chek Softclix Lancets lancets 1 each by Other route as directed. For blood sugar check    [provider]  albuterol (VENTOLIN HFA) 108 (90 Base) MCG/ACT inhaler Inhale 2 puffs into the lungs every 4 (four) hours as needed for shortness of breath.    [provider]  benztropine  (COGENTIN) 1 MG tablet Take 1 mg by mouth daily.    [provider]  budesonide-formoterol (SYMBICORT) 160-4.5 MCG/ACT inhaler Inhale 2 puffs into the lungs 2 (two) times daily.    [provider]  cholecalciferol (VITAMIN D) 25 MCG (1000 UNIT) tablet Take 1,000 Units by mouth daily.    [provider]  dapagliflozin propanediol (FARXIGA) 10 MG TABS tablet Take 10 mg by mouth daily.    [provider]  Deutetrabenazine (AUSTEDO) 6 MG TABS Take 6 mg by mouth in the morning and at bedtime.    [provider]  divalproex (DEPAKOTE) 500 MG DR tablet Take 1,000 mg by mouth 2 (two) times daily.    [provider]  ferrous sulfate 325 (65 FE) MG tablet Take 325 mg by mouth daily with breakfast.    [provider]  Finerenone (KERENDIA) 10 MG TABS Take 10 mg by mouth daily.    [provider]  glucose blood test strip 1 each by Other route as needed for other (for blood sugar). Use as instructed    [provider]  haloperidol (HALDOL) 5 MG tablet Take 5 mg by mouth daily.    [provider]  Lacosamide 150 MG TABS Take 150 mg by mouth 2 (two) times daily.    [provider]  levETIRAcetam (KEPPRA) 500 MG tablet Take 500 mg by mouth 2 (two) times daily.    [provider]  nicotine (NICODERM CQ - DOSED IN MG/24 HOURS) 14 mg/24hr patch Place 14 mg onto the skin daily.    [provider]  rosuvastatin (CRESTOR) 20 MG tablet Take 20 mg by mouth daily.    [provider]  Semaglutide (RYBELSUS) 7 MG TABS Take 7 mg by mouth daily.    [provider]  Tiotropium Bromide Monohydrate (SPIRIVA RESPIMAT) 1.25 MCG/ACT AERS Inhale 2 puffs into the lungs daily.    [provider]    Physical Exam:  Constitutional: Middle-age male currently in NAD, calm, comfortable Vitals:   08/15/20 0245 08/15/20 0530 08/15/20 0730 08/15/20 0745  BP: 94/73 106/68 109/64 102/64  Pulse: 73  63 92 (!) 58  Resp: 12 (!) 25 18 13   Temp:      TempSrc:      SpO2: 98% 98% 92% 100%   Eyes: PERRL, lids and conjunctivae normal ENMT: Mucous membranes are moist. Posterior pharynx clear of any exudate or lesions. Neck: normal, supple, no masses, no thyromegaly Respiratory: clear to auscultation bilaterally, no wheezing, no crackles. Normal respiratory effort. No accessory muscle use.  Cardiovascular: Regular rate and rhythm, no murmurs / rubs / gallops. No extremity edema. 2+ pedal pulses. No carotid bruits.  Abdomen: no tenderness, no masses palpated. No hepatosplenomegaly. Bowel sounds positive.  Musculoskeletal: no clubbing / cyanosis.  Involuntary movements noted of the Skin: no rashes, lesions, ulcers. No induration Neurologic: CN 2-12 grossly intact. Sensation intact, DTR normal. Strength 5/5 in all 4.  Psychiatric: Alert and oriented at least to person and states that he is in a hospital    Labs on Admission: I have personally reviewed following labs and imaging studies  CBC: Recent Labs  Lab 08/14/20  2104  WBC 5.4  NEUTROABS 2.3  HGB 13.2  HCT 38.4*  MCV 86.9  PLT 162   Basic Metabolic Panel: Recent Labs  Lab 08/14/20 2104  NA 122*  K 4.3  CL 88*  CO2 27  GLUCOSE 80  BUN 5*  CREATININE 0.86  CALCIUM 9.3  MG 1.4*   GFR: CrCl cannot be calculated (Unknown ideal weight.). Liver Function Tests: Recent Labs  Lab 08/14/20 2104  AST 13*  ALT 9  ALKPHOS 37*  BILITOT 0.4  PROT 6.9  ALBUMIN 3.3*   No results for input(s): LIPASE, AMYLASE in the last 168 hours. No results for input(s): AMMONIA in the last 168 hours. Coagulation Profile: No results for input(s): INR, PROTIME in the last 168 hours. Cardiac Enzymes: No results for input(s): CKTOTAL, CKMB, CKMBINDEX, TROPONINI in the last 168 hours. BNP (last 3 results) No results for input(s): PROBNP in the last 8760 hours. HbA1C: No results for input(s): HGBA1C in the last 72 hours. CBG: Recent Labs   Lab 08/15/20 0028  GLUCAP 82   Lipid Profile: No results for input(s): CHOL, HDL, LDLCALC, TRIG, CHOLHDL, LDLDIRECT in the last 72 hours. Thyroid Function Tests: No results for input(s): TSH, T4TOTAL, FREET4, T3FREE, THYROIDAB in the last 72 hours. Anemia Panel: No results for input(s): VITAMINB12, FOLATE, FERRITIN, TIBC, IRON, RETICCTPCT in the last 72 hours. Urine analysis:    Component Value Date/Time   COLORURINE YELLOW 06/22/2020 0720   APPEARANCEUR CLEAR 06/22/2020 0720   LABSPEC 1.028 06/22/2020 0720   PHURINE 6.0 06/22/2020 0720   GLUCOSEU >=500 (A) 06/22/2020 0720   HGBUR NEGATIVE 06/22/2020 0720   BILIRUBINUR NEGATIVE 06/22/2020 0720   KETONESUR NEGATIVE 06/22/2020 0720   PROTEINUR NEGATIVE 06/22/2020 0720   NITRITE NEGATIVE 06/22/2020 0720   LEUKOCYTESUR NEGATIVE 06/22/2020 0720   Sepsis Labs: No results found for this or any previous visit (from the past 240 hour(s)).   Radiological Exams on Admission: DG Chest 2 View  Result Date: 08/14/2020 CLINICAL DATA:  Seizure, seizure-like activity lasting 10 minutes EXAM: CHEST - 2 VIEW COMPARISON:  None. FINDINGS: The heart size and mediastinal contours are within normal limits. Both lungs are clear. The visualized skeletal structures are unremarkable. IMPRESSION: No active cardiopulmonary disease. Electronically Signed   By: Kreg Shropshire M.D.   On: 08/14/2020 21:21    EKG: Independently reviewed.  Sinus rhythm at 62 bpm  Assessment/Plan Breakthrough seizure: Acute.  Patient presents after having breakthrough seizures.  He has been receiving medications as prescribed by facility staff.  Noted to have a therapeutic valporic acid level at 52.  Seizure activity secondary to electrolyte disturbances vs. possible hypoglycemia.  -Admit to a medical telemetry bed -Seizure order set utilized -Seizure precautions -Neurochecks -Advance diet as tolerated -Continue home medication regimen Depakote Vimpat, and Keppra -Ativan IV as  needed for seizures lasting more than 2 minutes -Normal saline IV fluids at 75 mL/h -Consider consulting neurology in a.m. for further recommendations   Hyponatremia and hypochloremia: Acute.  Patient was noted to have a sodium of 122 with chloride 88.  He had been given 1 L normal saline IV fluids in the emergency department.  Last sodium check was 11 hours ago.  Medications that could likely cause hyponatremia are finerenone which patient uses for diabetes. -Check urine sodium and urine osmolarity -Stat BMP and monitor every 4 hours -Goal correction no more than 10 mmol/L in a 24-hour period -Adjust IV fluids as needed  Chest pain: Patient did report having some  chest discomfort with movement and activity.  High-sensitivity troponins were negative x2.  Question possible costochondritis from seizures  Diabetes mellitus type 2, with hypoglycemia: On admission blood glucose noted to be as low as 77 which is at the lower end of normal.  Patient is on Farxiga, finerenone, and semaglutide.  Semaglutide is at risk for causing patient to have hypoglycemic episodes. -Hypoglycemic protocols -Check hemoglobin A1c -Hold home oral medications. Consider possible need to discontinue semaglutide as this may put patient at risk for hypoglycemic episode which could have possibly provoked seizure unless someone at the facility can routinely check his blood sugars and/or finerenone which known to cause hyponatremia.  The person that used to check his blood sugars is no longer at that facility. -CBGs before every meal with sensitive SSI  Schizophrenia: Patient does have some involuntary movements likely from long-term use of -Continue Haldol, Austedo, Cogentin  Bradycardia: Patient intermittently noted to have heart rates with low as 49, but blood pressures currently maintained.  He is on any rate controlling medications. -Follow-up telemetry overnight  Hypomagnesemia: Acute.  Initial magnesium noted to be 1.4.   Patient was given 2 g of magnesium sulfate IV. -Continue to monitor and replace as needed  Tobacco abuse: Patient still continues to smoke on a regular basis. -Nicotine patch offered  DVT prophylaxis: Lovenox Code Status: Full Family Communication: Caregiver updated over the phone Disposition Plan: Likely discharge back to group home once medically stable Consults called: None Admission status: Inpatient,  Clydie Braun MD [facility]   If 7PM-7AM, please contact night-coverage   08/15/2020, 8:15 AM

## 2020-08-15 NOTE — ED Notes (Signed)
Pt sitting up in bed eating dinner tray. Waiting for transport. Pt denies pain. Urinal emptied. Denies further needs.

## 2020-08-15 NOTE — ED Notes (Signed)
Pt given a coke. Introduced myself to pt. Urinal emptied. Neuro assessed. Denies further needs.

## 2020-08-15 NOTE — ED Notes (Signed)
Pt ambulatory to restroom to have BM. Steady on feet.

## 2020-08-15 NOTE — ED Notes (Signed)
Pt resting in bed. Denies further needs. 

## 2020-08-15 NOTE — ED Notes (Addendum)
Pt ate dinner tray. Pt transported to floor via stretcher via transport. GCS 14.

## 2020-08-15 NOTE — Plan of Care (Signed)
Patient briefly discussed with me by ED provider due to breakthrough seizure.  At this time he appears compliant with his medications based on drug level (valproic acid 52, notably undetectable on his last presentation with breakthrough seizure in July 2022 and low at 31 in June 2022).  His labs are notable for significant new hyponatremia (sodium previously 138 and now 122 as well as hypomagnesemia at 1.4, hypochloremia to 88).  At this time do not think change in seizure medications is needed given electrolyte derangements are likely to be significant provoking factor.  Suggest further work-up of his electrolyte derangements per ED/primary team.  Please repage neurology if a full consult is felt to be needed or if further questions arise  Brooke Dare MD-PhD [facility] [phone]  Available 7 PM to 7 AM, outside of these hours please call Neurologist on call as listed on Amion.

## 2020-08-15 NOTE — ED Notes (Signed)
Food provided to pt.

## 2020-08-15 NOTE — ED Notes (Signed)
Hooked patient back up to the monitor after the bathroom patient is resting with call bell in reach 

## 2020-08-15 NOTE — ED Notes (Signed)
Pt provided with soda and [place] sandwich.

## 2020-08-16 ENCOUNTER — Other Ambulatory Visit: Payer: Self-pay

## 2020-08-16 ENCOUNTER — Observation Stay (HOSPITAL_COMMUNITY): Payer: Medicaid Other

## 2020-08-16 ENCOUNTER — Encounter (HOSPITAL_COMMUNITY): Payer: Self-pay | Admitting: Internal Medicine

## 2020-08-16 DIAGNOSIS — R569 Unspecified convulsions: Secondary | ICD-10-CM | POA: Diagnosis not present

## 2020-08-16 LAB — MAGNESIUM: Magnesium: 1.9 mg/dL (ref 1.7–2.4)

## 2020-08-16 LAB — CBC WITH DIFFERENTIAL/PLATELET
Abs Immature Granulocytes: 0.02 10*3/uL (ref 0.00–0.07)
Basophils Absolute: 0 10*3/uL (ref 0.0–0.1)
Basophils Relative: 0 %
Eosinophils Absolute: 0.1 10*3/uL (ref 0.0–0.5)
Eosinophils Relative: 1 %
HCT: 39.9 % (ref 39.0–52.0)
Hemoglobin: 13.4 g/dL (ref 13.0–17.0)
Immature Granulocytes: 0 %
Lymphocytes Relative: 54 %
Lymphs Abs: 3.2 10*3/uL (ref 0.7–4.0)
MCH: 29.4 pg (ref 26.0–34.0)
MCHC: 33.6 g/dL (ref 30.0–36.0)
MCV: 87.5 fL (ref 80.0–100.0)
Monocytes Absolute: 0.5 10*3/uL (ref 0.1–1.0)
Monocytes Relative: 8 %
Neutro Abs: 2.2 10*3/uL (ref 1.7–7.7)
Neutrophils Relative %: 37 %
Platelets: 177 10*3/uL (ref 150–400)
RBC: 4.56 MIL/uL (ref 4.22–5.81)
RDW: 13.9 % (ref 11.5–15.5)
WBC: 6 10*3/uL (ref 4.0–10.5)
nRBC: 0 % (ref 0.0–0.2)

## 2020-08-16 LAB — GLUCOSE, CAPILLARY
Glucose-Capillary: 100 mg/dL — ABNORMAL HIGH (ref 70–99)
Glucose-Capillary: 129 mg/dL — ABNORMAL HIGH (ref 70–99)
Glucose-Capillary: 97 mg/dL (ref 70–99)
Glucose-Capillary: 126 mg/dL — ABNORMAL HIGH (ref 70–99)

## 2020-08-16 LAB — BASIC METABOLIC PANEL
Anion gap: 8 (ref 5–15)
BUN: 9 mg/dL (ref 6–20)
CO2: 27 mmol/L (ref 22–32)
Calcium: 9.5 mg/dL (ref 8.9–10.3)
Chloride: 101 mmol/L (ref 98–111)
Creatinine, Ser: 0.91 mg/dL (ref 0.61–1.24)
GFR, Estimated: >60 mL/min (ref >60)
Glucose, Bld: 98 mg/dL (ref 70–99)
Potassium: 4.2 mmol/L (ref 3.5–5.1)
Sodium: 136 mmol/L (ref 135–145)

## 2020-08-16 LAB — HEMOGLOBIN A1C
Hgb A1c MFr Bld: 6.2 % — ABNORMAL HIGH (ref 4.8–5.6)
Mean Plasma Glucose: 131.24 mg/dL

## 2020-08-16 NOTE — Progress Notes (Signed)
Discharged back to group home accompanied by [facility] and 3 transporters.  IV access removed.  All belongings sent with.

## 2020-08-16 NOTE — Progress Notes (Signed)
CM has left voicemail for caregiver and no return call. CM called again and received a text back that he would call back with no call. CM has texted caregiver with no return text or call. Cm has updated the bedside RN.

## 2020-08-16 NOTE — Plan of Care (Signed)

## 2020-08-16 NOTE — Progress Notes (Signed)
EEG complete - results pending 

## 2020-08-16 NOTE — Plan of Care (Signed)

## 2020-08-16 NOTE — Procedures (Signed)
Patient Name: Daniel Foley  MRN: [id number]  Epilepsy Attending: Charlsie Quest  Referring Physician/Provider: Dr Burnadette Pop Date: 08/16/2020 Duration: 21.04 mins  Patient history: 50 year old male with history of epilepsy who presented with seizure-like episodes.  EEG to evaluate for seizures.  Level of alertness: Awake  AEDs during EEG study: LEV, LCM  Technical aspects: This EEG study was done with scalp electrodes positioned according to the 10-20 International system of electrode placement. Electrical activity was acquired at a sampling rate of 500Hz  and reviewed with a high frequency filter of 70Hz  and a low frequency filter of 1Hz . EEG data were recorded continuously and digitally stored.   Description: The posterior dominant rhythm consists of 8Hz  activity of moderate voltage (25-35 uV) seen predominantly in posterior head regions, symmetric and reactive to eye opening and eye closing.  Patient was noted to have episodes of head shaking.  Concomitant EEG before, during and after the event showed movement artifact, no EEG change was noted to suggest seizure.  Photic driving was not seen during photic stimulation.  Hyperventilation was not performed.    IMPRESSION: This study is within normal limits. No seizures or epileptiform discharges were seen throughout the recording.  Patient was noted to have multiple episodes of head shaking without concomitant EEG change.  These episodes were not epileptic.  Oma Alpert 

## 2020-08-16 NOTE — Discharge Summary (Signed)
Physician Discharge Summary  Daniel Foley NUU:[id number] DOB: 1970-12-26 DOA: 08/14/2020  PCP: Pcp, No  Admit date: 08/14/2020 Discharge date: 08/16/2020  Admitted From: Home Disposition:  Home  Discharge Condition:Stable CODE STATUS:FULL Diet recommendation: Carb Modified   Brief/Interim Summary: HPI: Daniel Foley is a 50 y.o. male with medical history significant of schizophrenia with anti-parkinsonian symptoms, seizure disorder, hyperlipidemia, tobacco abuse, diabetes mellitus type 2 presents after having seizure activity lasting approximately 10 minutes.  Patient had no recollection of the events, and states that the last thing he recalls was having the use the bathroom.  He is originally from [place], but has been staying here in a group home.  Patient reports that he has been taking his anti-seizure medications as advised because the staff give them to him.  He did note that the person who used to check his blood sugars is no longer there, and that his blood sugars have not been recently checked last couple weeks.  Patient did report having some chest discomfort when he is getting up and moving around.  He denies having any recent fever, chills, nausea, vomiting, diarrhea, dysuria, cough, or shortness of breath.  His caregiver notes that the patient still smokes cigarettes regularly.     Of note patient had been admitted back in June after having witnessed seizure.  He underwent MRI and EEG which showed no acute abnormalities at that time and urine drug screen was negative.  Patient denies any alcohol or illicit drug use.  At that time it was suspected patient had seizure secondary to noncompliance with medications, but that seems less likely as group home staff give the patient his medications regularly.   ED Course: Upon admission into the emergency department patient was seen to be afebrile, pulse 49-92, respirations 10-25, and all other vital signs relatively maintained.  Labs significant  for sodium 122, chloride 88, glucose 80, magnesium 1.4, alcohol level undetectable, and valproic acid level 52.  UDS was negative.  Chest x-ray noted no acute abnormality. He was given Ativan 1 mg IV, magnesium sulfate 2 g IV, and 1 L normal saline IV fluids.  Case had been discussed with neurology who suspected electrolyte abnormalities as the possible cause of symptoms and recommended continuation of current antiseizure medication regimen.  [facility] called to admit.  Hospital course:  Patient's hospital course remained stable.  Initial blood work showed sodium level of 122.  Case was discussed with neurology, patient seemed compliant on on his home seizure medications based upon drug level.  Neurology recommended to continue same AEDs on same dose. Patient seen and examined at the bedside this morning.  He was hemodynamically stable during my evaluation.  He strongly claimed that he was taking his medications as instructed.  He denies any alcohol use or drug abuse. We did the EEG this morning and it did not show any seizures or epileptiform discharges..  Patient is completely alert and oriented.  We have reinforced on compliance on his seizure medications.  His sodium level is currently normal.  Seizure could have been secondary to hyponatremia on admission which has resolved.   We recommend to follow-up with his neurologist as an outpatient and also with his PCP.  He is medically stable for discharge to group home today.   Discharge Diagnoses:  Principal Problem:   Seizures (HCC) Active Problems:   Tobacco abuse   Hypomagnesemia   Bradycardia   Controlled type 2 diabetes mellitus with hypoglycemia (HCC)   Chest pain   Schizophrenia (  [facility])    Discharge Instructions  Discharge Instructions     Diet Carb Modified   Complete by: As directed    Discharge instructions   Complete by: As directed    1)Please take the prescribed medications as instructed. 2)Follow up with your neurologist in 2  weeks. 3)Follow up with your PCP in a week. 4)Continue to take your seizure medications.   Increase activity slowly   Complete by: As directed       Allergies as of 08/16/2020       Reactions   Contrast Media [iodinated Diagnostic Agents] Hives, Rash   Patient had contrast in MRI, arrived back to room with redness, rash and hives on his face. Reports burning, no itching.        Medication List     TAKE these medications    Accu-Chek Softclix Lancets lancets 1 each by Other route as directed. For blood sugar check   albuterol 108 (90 Base) MCG/ACT inhaler Commonly known as: VENTOLIN HFA Inhale 2 puffs into the lungs every 4 (four) hours as needed for shortness of breath.   Austedo 6 MG Tabs Generic drug: Deutetrabenazine Take 6 mg by mouth in the morning and at bedtime.   benztropine 1 MG tablet Commonly known as: COGENTIN Take 1 mg by mouth daily.   budesonide-formoterol 160-4.5 MCG/ACT inhaler Commonly known as: SYMBICORT Inhale 2 puffs into the lungs 2 (two) times daily.   cholecalciferol 25 MCG (1000 UNIT) tablet Commonly known as: VITAMIN D Take 1,000 Units by mouth daily.   dapagliflozin propanediol 10 MG Tabs tablet Commonly known as: FARXIGA Take 10 mg by mouth daily.   divalproex 500 MG DR tablet Commonly known as: DEPAKOTE Take 1,000 mg by mouth 2 (two) times daily.   ferrous sulfate 325 (65 FE) MG tablet Take 325 mg by mouth daily with breakfast.   glucose blood test strip 1 each by Other route as needed for other (for blood sugar). Use as instructed   haloperidol 5 MG tablet Commonly known as: HALDOL Take 5 mg by mouth daily.   Kerendia 10 MG Tabs Generic drug: Finerenone Take 10 mg by mouth daily.   Lacosamide 150 MG Tabs Take 150 mg by mouth 2 (two) times daily.   levETIRAcetam 500 MG tablet Commonly known as: KEPPRA Take 500 mg by mouth 2 (two) times daily.   nicotine 14 mg/24hr patch Commonly known as: NICODERM CQ - dosed in  mg/24 hours Place 14 mg onto the skin daily.   rosuvastatin 20 MG tablet Commonly known as: CRESTOR Take 20 mg by mouth daily.   Rybelsus 7 MG Tabs Generic drug: Semaglutide Take 7 mg by mouth daily.   Spiriva Respimat 1.25 MCG/ACT Aers Generic drug: Tiotropium Bromide Monohydrate Inhale 2 puffs into the lungs daily.        Allergies  Allergen Reactions   Contrast Media [Iodinated Diagnostic Agents] Hives and Rash    Patient had contrast in MRI, arrived back to room with redness, rash and hives on his face. Reports burning, no itching.    Consultations: Neurology   Procedures/Studies: DG Chest 2 View  Result Date: 08/14/2020 CLINICAL DATA:  Seizure, seizure-like activity lasting 10 minutes EXAM: CHEST - 2 VIEW COMPARISON:  None. FINDINGS: The heart size and mediastinal contours are within normal limits. Both lungs are clear. The visualized skeletal structures are unremarkable. IMPRESSION: No active cardiopulmonary disease. Electronically Signed   By: Kreg ShropshirePrice  DeHay M.D.   On: 08/14/2020 21:21   EEG  adult  Result Date: 08/16/2020 Charlsie Quest, MD     08/16/2020 11:54 AM Patient Name: Daniel Foley MRN: [id number] Epilepsy Attending: Charlsie Quest Referring Physician/Provider: Dr Burnadette Pop Date: 08/16/2020 Duration: 21.04 mins Patient history: 50 year old male with history of epilepsy who presented with seizure-like episodes.  EEG to evaluate for seizures. Level of alertness: Awake AEDs during EEG study: LEV, LCM Technical aspects: This EEG study was done with scalp electrodes positioned according to the 10-20 International system of electrode placement. Electrical activity was acquired at a sampling rate of 500Hz  and reviewed with a high frequency filter of 70Hz  and a low frequency filter of 1Hz . EEG data were recorded continuously and digitally stored. Description: The posterior dominant rhythm consists of 8Hz  activity of moderate voltage (25-35 uV) seen predominantly in  posterior head regions, symmetric and reactive to eye opening and eye closing.  Patient was noted to have episodes of head shaking.  Concomitant EEG before, during and after the event showed movement artifact, no EEG change was noted to suggest seizure.  Photic driving was not seen during photic stimulation.  Hyperventilation was not performed.  IMPRESSION: This study is within normal limits. No seizures or epileptiform discharges were seen throughout the recording. Patient was noted to have multiple episodes of head shaking without concomitant EEG change.  These episodes were not epileptic. Priyanka      Subjective: Patient seen and examined the bedside this morning.  Hemodynamically stable for discharge today.  Discharge Exam: Vitals:   08/16/20 0719 08/16/20 1111  BP: 102/67 99/69  Pulse: 65 75  Resp: 14 14  Temp: 98.4 F (36.9 C) 98.6 F (37 C)  SpO2: 100% 100%   Vitals:   08/16/20 0348 08/16/20 0400 08/16/20 0719 08/16/20 1111  BP: 108/76  102/67 99/69  Pulse: 75  65 75  Resp: 17  14 14   Temp: 97.7 F (36.5 C)  98.4 F (36.9 C) 98.6 F (37 C)  TempSrc: Oral  Oral Oral  SpO2: 99%  100% 100%  Weight:  96 kg    Height:  6\' 2"  (1.88 m)      General: Pt is alert, awake, not in acute distress Cardiovascular: RRR, S1/S2 +, no rubs, no gallops Respiratory: CTA bilaterally, no wheezing, no rhonchi Abdominal: Soft, NT, ND, bowel sounds + Extremities: no edema, no cyanosis    The results of significant diagnostics from this hospitalization (including imaging, microbiology, ancillary and laboratory) are listed below for reference.     Microbiology: Recent Results (from the past 240 hour(s))  SARS CORONAVIRUS 2 (TAT 6-24 HRS) Nasopharyngeal Nasopharyngeal Swab     Status: None   Collection Time: 08/15/20  3:15 AM   Specimen: Nasopharyngeal Swab  Result Value Ref Range Status   SARS Coronavirus 2 NEGATIVE NEGATIVE Final    Comment: (NOTE) SARS-CoV-2 target nucleic  acids are NOT DETECTED.  The SARS-CoV-2 RNA is generally detectable in upper and lower respiratory specimens during the acute phase of infection. Negative results do not preclude SARS-CoV-2 infection, do not rule out co-infections with other pathogens, and should not be used as the sole basis for treatment or other patient management decisions. Negative results must be combined with clinical observations, patient history, and epidemiological information. The expected result is Negative.  Fact Sheet for Patients: 10/16/20  Fact Sheet for Healthcare Providers: 10/16/20  This test is not yet approved or cleared by the 06-11-1992 FDA and  has been authorized for detection and/or diagnosis of SARS-CoV-2  by FDA under an Emergency Use Authorization (EUA). This EUA will remain  in effect (meaning this test can be used) for the duration of the COVID-19 declaration under Se ction 564(b)(1) of the Act, 21 U.S.C. section 360bbb-3(b)(1), unless the authorization is terminated or revoked sooner.  Performed at [facility], [address] [place], [place] [address]      Labs: BNP (last 3 results) No results for input(s): BNP in the last 8760 hours. Basic Metabolic Panel: Recent Labs  Lab 08/14/20 2104 08/15/20 1246 08/15/20 1542 08/15/20 1835 08/15/20 2037 08/16/20 0115  NA 122* 134* 136 136 135 136  K 4.3 4.8 4.3 4.3 4.5 4.2  CL 88* 101 102 103 103 101  CO2 27 27 28 24 24 27   GLUCOSE 80 93 108* 93 107* 98  BUN 5* 8 7 9 9 9   CREATININE 0.86 1.01 1.09 1.00 1.00 0.91  CALCIUM 9.3 9.7 9.5 9.8 9.6 9.5  MG 1.4*  --   --   --   --  1.9   Liver Function Tests: Recent Labs  Lab 08/14/20 2104  AST 13*  ALT 9  ALKPHOS 37*  BILITOT 0.4  PROT 6.9  ALBUMIN 3.3*   No results for input(s): LIPASE, AMYLASE in the last 168 hours. No results for input(s): AMMONIA in the last 168 hours. CBC: Recent Labs  Lab  08/14/20 2104 08/16/20 0115  WBC 5.4 6.0  NEUTROABS 2.3 2.2  HGB 13.2 13.4  HCT 38.4* 39.9  MCV 86.9 87.5  PLT 162 177   Cardiac Enzymes: No results for input(s): CKTOTAL, CKMB, CKMBINDEX, TROPONINI in the last 168 hours. BNP: Invalid input(s): POCBNP CBG: Recent Labs  Lab 08/15/20 1417 08/15/20 1557 08/15/20 2208 08/16/20 0638 08/16/20 1134  GLUCAP 113* 110* 139* 100* 129*   D-Dimer No results for input(s): DDIMER in the last 72 hours. Hgb A1c Recent Labs    08/16/20 0115  HGBA1C 6.2*   Lipid Profile No results for input(s): CHOL, HDL, LDLCALC, TRIG, CHOLHDL, LDLDIRECT in the last 72 hours. Thyroid function studies No results for input(s): TSH, T4TOTAL, T3FREE, THYROIDAB in the last 72 hours.  Invalid input(s): FREET3 Anemia work up No results for input(s): VITAMINB12, FOLATE, FERRITIN, TIBC, IRON, RETICCTPCT in the last 72 hours. Urinalysis    Component Value Date/Time   COLORURINE STRAW (A) 08/15/2020 1248   APPEARANCEUR CLEAR 08/15/2020 1248   LABSPEC 1.004 (L) 08/15/2020 1248   PHURINE 8.0 08/15/2020 1248   GLUCOSEU NEGATIVE 08/15/2020 1248   HGBUR SMALL (A) 08/15/2020 1248   BILIRUBINUR NEGATIVE 08/15/2020 1248   KETONESUR NEGATIVE 08/15/2020 1248   PROTEINUR NEGATIVE 08/15/2020 1248   NITRITE NEGATIVE 08/15/2020 1248   LEUKOCYTESUR NEGATIVE 08/15/2020 1248   Sepsis Labs Invalid input(s): PROCALCITONIN,  WBC,  LACTICIDVEN Microbiology Recent Results (from the past 240 hour(s))  SARS CORONAVIRUS 2 (TAT 6-24 HRS) Nasopharyngeal Nasopharyngeal Swab     Status: None   Collection Time: 08/15/20  3:15 AM   Specimen: Nasopharyngeal Swab  Result Value Ref Range Status   SARS Coronavirus 2 NEGATIVE NEGATIVE Final    Comment: (NOTE) SARS-CoV-2 target nucleic acids are NOT DETECTED.  The SARS-CoV-2 RNA is generally detectable in upper and lower respiratory specimens during the acute phase of infection. Negative results do not preclude SARS-CoV-2  infection, do not rule out co-infections with other pathogens, and should not be used as the sole basis for treatment or other patient management decisions. Negative results must be combined with clinical observations,  patient history, and epidemiological information. The expected result is Negative.  Fact Sheet for Patients: HairSlick.no  Fact Sheet for Healthcare Providers: quierodirigir.com  This test is not yet approved or cleared by the [place] FDA and  has been authorized for detection and/or diagnosis of SARS-CoV-2 by FDA under an Emergency Use Authorization (EUA). This EUA will remain  in effect (meaning this test can be used) for the duration of the COVID-19 declaration under Se ction 564(b)(1) of the Act, 21 U.S.C. section 360bbb-3(b)(1), unless the authorization is terminated or revoked sooner.  Performed at [facility], [address], [place], [place] [address]     Please note: You were cared for by a hospitalist during your hospital stay. Once you are discharged, your primary care physician will handle any further medical issues. Please note that NO REFILLS for any discharge medications will be authorized once you are discharged, as it is imperative that you return to your primary care physician (or establish a relationship with a primary care physician if you do not have one) for your post hospital discharge needs so that they can reassess your need for medications and monitor your lab values.    Time coordinating discharge: 40 minutes  SIGNED:   Burnadette Pop, MD  [facility] 08/16/2020, 12:52 PM Pager [phone]  If 7PM-7AM, please contact night-coverage [email or web address] Password [email or web address]

## 2020-08-16 NOTE — TOC Transition Note (Signed)
Transition of Care [facility]) - CM/SW Discharge Note   Patient Details  Name: Daniel Foley MRN: [id number] Date of Birth: August 18, 1970  [facility] CM/SW Contact:  Kermit Balo, RN Phone Number: 08/16/2020, 4:33 PM   Clinical Narrative:    CM was able to talk to [place] (caregiver) boss, Jeannett Senior. They are in agreement with pt returning home. They are not able to provide transport home. They are in agreement with [facility] transport. CM verified address. Jeannett Senior states someone will be at the home.  Bedside RN updated and d/c packet at the desk.    Final next level of care: Home/Self Care Barriers to Discharge: No Barriers Identified   Patient Goals and CMS Choice        Discharge Placement                       Discharge Plan and Services                                     Social Determinants of Health (SDOH) Interventions     Readmission Risk Interventions No flowsheet data found.

## 2020-08-20 LAB — LEVETIRACETAM LEVEL: Levetiracetam Lvl: 4.1 ug/mL — ABNORMAL LOW (ref 10.0–40.0)

## 2021-01-08 ENCOUNTER — Other Ambulatory Visit: Payer: Self-pay

## 2021-01-08 ENCOUNTER — Emergency Department (HOSPITAL_COMMUNITY)
Admission: EM | Admit: 2021-01-08 | Discharge: 2021-01-11 | Disposition: A | Discharge status: Other Acute Inpatient Hospital | Payer: No Typology Code available for payment source | Attending: Emergency Medicine | Admitting: Emergency Medicine

## 2021-01-08 ENCOUNTER — Encounter (HOSPITAL_COMMUNITY): Payer: Self-pay | Admitting: *Deleted

## 2021-01-08 DIAGNOSIS — F209 Schizophrenia, unspecified: Secondary | ICD-10-CM

## 2021-01-08 DIAGNOSIS — E871 Hypo-osmolality and hyponatremia: Secondary | ICD-10-CM

## 2021-01-08 DIAGNOSIS — E1122 Type 2 diabetes mellitus with diabetic chronic kidney disease: Secondary | ICD-10-CM | POA: Insufficient documentation

## 2021-01-08 DIAGNOSIS — Z20822 Contact with and (suspected) exposure to covid-19: Secondary | ICD-10-CM | POA: Insufficient documentation

## 2021-01-08 DIAGNOSIS — Z79899 Other long term (current) drug therapy: Secondary | ICD-10-CM | POA: Diagnosis not present

## 2021-01-08 DIAGNOSIS — N189 Chronic kidney disease, unspecified: Secondary | ICD-10-CM | POA: Diagnosis not present

## 2021-01-08 LAB — COMPREHENSIVE METABOLIC PANEL
ALT: 12 U/L (ref 0–44)
AST: 19 U/L (ref 15–41)
Albumin: 4.2 g/dL (ref 3.5–5.0)
Alkaline Phosphatase: 45 U/L (ref 38–126)
Anion gap: 7 (ref 5–15)
BUN: 7 mg/dL (ref 6–20)
CO2: 26 mmol/L (ref 22–32)
Calcium: 9.4 mg/dL (ref 8.9–10.3)
Chloride: 94 mmol/L — ABNORMAL LOW (ref 98–111)
Creatinine, Ser: 1.03 mg/dL (ref 0.61–1.24)
GFR, Estimated: >60 mL/min (ref >60)
Glucose, Bld: 97 mg/dL (ref 70–99)
Potassium: 3.4 mmol/L — ABNORMAL LOW (ref 3.5–5.1)
Sodium: 127 mmol/L — ABNORMAL LOW (ref 135–145)
Total Bilirubin: 0.5 mg/dL (ref 0.3–1.2)
Total Protein: 8.4 g/dL — ABNORMAL HIGH (ref 6.5–8.1)

## 2021-01-08 LAB — RAPID URINE DRUG SCREEN, HOSP PERFORMED
Amphetamines: NOT DETECTED
Barbiturates: NOT DETECTED
Benzodiazepines: NOT DETECTED
Cocaine: NOT DETECTED
Opiates: NOT DETECTED
Tetrahydrocannabinol: NOT DETECTED

## 2021-01-08 LAB — CBC
HCT: 39.2 % (ref 39.0–52.0)
Hemoglobin: 13.3 g/dL (ref 13.0–17.0)
MCH: 30.4 pg (ref 26.0–34.0)
MCHC: 33.9 g/dL (ref 30.0–36.0)
MCV: 89.7 fL (ref 80.0–100.0)
Platelets: 216 10*3/uL (ref 150–400)
RBC: 4.37 MIL/uL (ref 4.22–5.81)
RDW: 12.8 % (ref 11.5–15.5)
WBC: 7.5 10*3/uL (ref 4.0–10.5)
nRBC: 0 % (ref 0.0–0.2)

## 2021-01-08 LAB — ETHANOL: Alcohol, Ethyl (B): <10 mg/dL (ref <10)

## 2021-01-08 LAB — ACETAMINOPHEN LEVEL: Acetaminophen (Tylenol), Serum: <10 ug/mL — ABNORMAL LOW (ref 10–30)

## 2021-01-08 LAB — SALICYLATE LEVEL: Salicylate Lvl: <7 mg/dL — ABNORMAL LOW (ref 7.0–30.0)

## 2021-01-08 MED ORDER — SODIUM CHLORIDE 0.9 % IV BOLUS
1000.0000 mL | Freq: Once | INTRAVENOUS | Status: AC
  Administered 2021-01-08: 1000 mL via INTRAVENOUS

## 2021-01-08 NOTE — BH Assessment (Addendum)
Comprehensive Clinical Assessment (CCA) Note  01/09/2021 Daniel Foley [id number]  Disposition: [name], PA, patient meets inpatient criteria. Disposition SW will secure placement in the AM. [name], RN, informed of disposition.  The patient demonstrates the following risk factors for suicide: Chronic risk factors for suicide include: N/A. Acute risk factors for suicide include: N/A. Protective factors for this patient include: hope for the future. Considering these factors, the overall suicide risk at this point appears to be high. Patient is not appropriate for outpatient follow up.  Flowsheet Row ED [facility] to Hosp-Admission (Discharged) from [facility]  C-SSRS RISK CATEGORY No Risk No Risk No Risk      Daniel Foley is a 51 year old male presenting under [facility]C to [facility] due to delusions. When asked about SI, patient stated "yes, how to be good". When asked about why he is in ED, patient stated "projects didn't play no games". Patient then stated "they wanted me to lie about staying @home ". Patient denied being aggressive with staff members. When asked, do you have any children, patient stated "been a while before I remember". Patient was not understanding questions asked. Patient was asked, do you have a psychiatrist or therapist, patient stated, "no I went to a bank before". Patient seemed to present with altered mental status. Patient was cooperative during assessment.   PER EDP 01/09/20: When asked why he is here patient states "there was weed that the only way to get rid of it was to eat it".  When asked about suicidal ideation he states "yes, there is bad people in the house".  He will not elaborate further.  Denies homicidal ideation.  When asked about visual or auditory hallucinations patient states "yes, there is a bad  atmosphere".  IVC, petitioner is Daniel Foley, caregiver, [phone], unable to reach at this time. Respondent is schizophrenic, has diabetes, chronic kidney disease and seizures. Takes medication daily. Respondent walks off from facility into heavy traffic. Respondent has said that he sees ol d friends and family and they tell him to do bad things. Respondent mumbled to himself but now he full blown talks to himself. Respondent has been aggressive with staff and threatening.     Chief Complaint:  Chief Complaint  Patient presents with   Medical Clearance   IVC   Visit Diagnosis:  Hx Schizophrenia, unspecified   CCA Screening, Triage and Referral (STR)  Patient Reported Information How did you hear about us? Other (Comment) (group home)  What Is the Reason for Your Visit/Call Today? uta  How Long Has This Been Causing You Problems? -- [name](uta)  What Do You Feel Would Help You the Most Today? -- (uta)   Have You Recently Had Any Thoughts About Hurting Yourself? -- [name](uta)  Are You Planning to Commit Suicide/Harm Yourself At This time? -- [name](uta)   Have you Recently Had Thoughts About Hurting Someone [name]? No  Are You Planning to Harm Someone at This Time? No  Explanation: No data recorded  Have You Used Any Alcohol or Drugs in the Past 24 Hours? No  How Long Ago Did You Use Drugs or Alcohol? No data recorded What Did You Use and How Much? No data recorded  Do You Currently Have a Therapist/Psychiatrist? No  Name of Therapist/Psychiatrist: No data recorded  Have You Been Recently Discharged From Any Office Practice or Programs? No  Explanation of Discharge From Practice/Program: No data recorded    CCA Screening Triage Referral Assessment Type of Contact: Tele-Assessment  Telemedicine Service Delivery:   Is this Initial or Reassessment? Initial Assessment  Date Telepsych consult ordered in [facility]:  01/08/21  Time Telepsych consult ordered in [facility]  1908  Location  of Assessment: [facility]  Provider Location: [facility] Assessment Services   Collateral Involvement: none reported   Does Patient Have a [place]? No data recorded Name and Contact of Legal Guardian: No data recorded If Minor and Not Living with Parent(s), Who has Custody? No data recorded Is CPS involved or ever been involved? No data recorded Is APS involved or ever been involved? No data recorded  Patient Determined To Be At Risk for Harm To Self or Others Based on Review of Patient Reported Information or Presenting Complaint? No data recorded Method: No data recorded Availability of Means: No data recorded Intent: No data recorded Notification Required: No data recorded Additional Information for Danger to Others Potential: No data recorded Additional Comments for Danger to Others Potential: No data recorded Are There Guns or Other Weapons in Your Home? No data recorded Types of Guns/Weapons: No data recorded Are These Weapons Safely Secured?                            No data recorded Who Could Verify You Are Able To Have These Secured: No data recorded Do You Have any Outstanding Charges, Pending Court Dates, Parole/Probation? No data recorded Contacted To Inform of Risk of Harm To Self or Others: No data recorded   Does Patient Present under Involuntary Commitment? No data recorded IVC Papers Initial File Date: No data recorded  [place] of Residence: No data recorded  Patient Currently Receiving the Following Services: No data recorded  Determination of Need: No data recorded  Options For Referral: No data recorded    CCA Biopsychosocial Patient Reported Schizophrenia/Schizoaffective Diagnosis in Past: No data recorded  Strengths: uta   Mental Health Symptoms Depression:   -- (uta)   Duration of Depressive symptoms:    Mania:   None   Anxiety:    None   Psychosis:   Delusions   Duration of Psychotic symptoms:  Duration of Psychotic  Symptoms: -- Pincus Badder)   Trauma:   -- Pincus Badder)   Obsessions:   -- (uta)   Compulsions:   -- (uta)   Inattention:   None   Hyperactivity/Impulsivity:   None   Oppositional/Defiant Behaviors:   None   Emotional Irregularity:   None   Other Mood/Personality Symptoms:  No data recorded   Mental Status Exam Appearance and self-care  Stature:   Average   Weight:   Average weight   Clothing:   Casual   Grooming:   Normal   Cosmetic use:   None   Posture/gait:   Normal   Motor activity:   Not Remarkable   Sensorium  Attention:   Normal   Concentration:   Focuses on irrelevancies   Orientation:   -- Pincus Badder)   Recall/memory:   Defective in Remote; Defective in Recent; Defective in Short-term; Defective in Immediate   Affect and Mood  Affect:   Appropriate   Mood:   Anxious   Relating  Eye contact:   Normal   Facial expression:   Tense   Attitude toward examiner:   Cooperative   Thought and Language  Speech flow:  Garbled;  Soft   Thought content:   Delusions   Preoccupation:   None   Hallucinations:   -- Pincus Badder)   Organization:  No data recorded  [employer] of Knowledge:   Average   Intelligence:   Average   Abstraction:  No data recorded  Judgement:   Impaired   Reality Testing:   Distorted   Insight:   Lacking   Decision Making:   Impulsive   Social Functioning  Social Maturity:   Impulsive   Social Judgement:   Heedless   Stress  Stressors:   Illness   Coping Ability:   Exhausted   Skill Deficits:   Self-control; Environmental health practitioner; Communication   Supports:   Support needed     Religion: Religion/Spirituality Are You A Religious Person?:  Special educational needs teacher)  Leisure/Recreation: Leisure / Recreation Do You Have Hobbies?: Yes Leisure and Hobbies: "like to fight people being messy"  Exercise/Diet: Exercise/Diet Do You Exercise?:  (uta) Have You Gained or Lost A Significant Amount of Weight in  the Past Six Months?:  (uta) Do You Follow a Special Diet?:  (uta) Do You Have Any Trouble Sleeping?:  (uta)   CCA Employment/Education Employment/Work Situation: Employment / Work Situation Employment Situation: On disability Why is Patient on Disability: uta How Long has Patient Been on Disability: uta Patient's Job has Been Impacted by Current Illness:  Pincus Badder)  Education: Education Is Patient Currently Attending School?: No Last Grade Completed: 10 Did You Attend College?: No Did You Have An Individualized Education Program (IIEP):  Pincus Badder) Did You Have Any Difficulty At School?:  Pincus Badder) Patient's Education Has Been Impacted by Current Illness:  (uta)   CCA Family/Childhood History Family and Relationship History: Family history Marital status: Single Does patient have children?:  (uta)  Childhood History:  Childhood History By whom was/is the patient raised?:  (uta) Did patient suffer any verbal/emotional/physical/sexual abuse as a child?:  (uta) Did patient suffer from severe childhood neglect?:  (uta) Has patient ever been sexually abused/assaulted/raped as an adolescent or adult?:  (uta)  Child/Adolescent Assessment:     CCA Substance Use Alcohol/Drug Use: Alcohol / Drug Use Pain Medications: see MAR Prescriptions: see MAR Over the Counter: see MAR History of alcohol / drug use?: No history of alcohol / drug abuse                         ASAM's:  Six Dimensions of Multidimensional Assessment  Dimension 1:  Acute Intoxication and/or Withdrawal Potential:      Dimension 2:  Biomedical Conditions and Complications:      Dimension 3:  Emotional, Behavioral, or Cognitive Conditions and Complications:     Dimension 4:  Readiness to Change:     Dimension 5:  Relapse, Continued use, or Continued Problem Potential:     Dimension 6:  Recovery/Living Environment:     ASAM Severity Score:    ASAM Recommended Level of Treatment:     Substance use  Disorder (SUD)    Recommendations for Services/Supports/Treatments:    Discharge Disposition:    DSM5 Diagnoses: Patient Active Problem List   Diagnosis Date Noted   Seizures ([place]) 08/15/2020   Tobacco abuse 08/15/2020   Hypomagnesemia 08/15/2020   Bradycardia 08/15/2020   Controlled type 2 diabetes mellitus with hypoglycemia ([place]) 08/15/2020   Chest pain 08/15/2020   Schizophrenia ([place]) 08/15/2020   Observed seizure-like activity ([place]) 06/22/2020   Witnessed seizure-like activity ([place]) 06/21/2020   Seizure ([place]) 06/21/2020  Referrals to Alternative Service(s): Referred to Alternative Service(s):   Place:   Date:   Time:    Referred to Alternative Service(s):   Place:   Date:   Time:    Referred to Alternative Service(s):   Place:   Date:   Time:    Referred to Alternative Service(s):   Place:   Date:   Time:     Venora Maples, [facility]

## 2021-01-08 NOTE — ED Provider Notes (Signed)
[facility] EMERGENCY DEPARTMENT Provider Note    CSN: [id number] Arrival date & time: 01/08/21 1842  History Chief Complaint  Patient presents with   Medical Clearance   IVC    Daniel Foley is a 51 y.o. male with history of seizures (on Keppra and Depakote), schizophrenia living at a group home, brought to the ED via law enforcement under IVC, taken out by group home staff. Per paperwork:  "Respondent is schizophrenic, has diabetes, chronic kidney disease and seizures.  Takes medication daily.  Respondent walks off from facility into heavy traffic.  Respondent has said that he sees old friends and family and they tell him to do bad things.  Respondent mumble to himself but now he full blown out talks to himself.  Respondent has been aggressive with staff and threatening." Patient is unable to give much other useful history due to his mental health issues, rambles incoherently about drug use, his favorite rappers etc.    Home Medications Prior to Admission medications   Medication Sig Start Date End Date Taking? Authorizing Provider  Accu-Chek Softclix Lancets lancets 1 each by Other route as directed. For blood sugar check    [provider]  albuterol (VENTOLIN HFA) 108 (90 Base) MCG/ACT inhaler Inhale 2 puffs into the lungs every 4 (four) hours as needed for shortness of breath.    [provider]  benztropine (COGENTIN) 1 MG tablet Take 1 mg by mouth daily.    [provider]  budesonide-formoterol (SYMBICORT) 160-4.5 MCG/ACT inhaler Inhale 2 puffs into the lungs 2 (two) times daily.    [provider]  cholecalciferol (VITAMIN D) 25 MCG (1000 UNIT) tablet Take 1,000 Units by mouth daily.    [provider]  dapagliflozin propanediol (FARXIGA) 10 MG TABS tablet Take 10 mg by mouth daily.    [provider]  Deutetrabenazine (AUSTEDO) 6 MG TABS Take 6 mg by mouth in the morning and at bedtime.    [provider]   divalproex (DEPAKOTE) 500 MG DR tablet Take 1,000 mg by mouth 2 (two) times daily.    [provider]  ferrous sulfate 325 (65 FE) MG tablet Take 325 mg by mouth daily with breakfast.    [provider]  Finerenone (KERENDIA) 10 MG TABS Take 10 mg by mouth daily.    [provider]  glucose blood test strip 1 each by Other route as needed for other (for blood sugar). Use as instructed    [provider]  haloperidol (HALDOL) 5 MG tablet Take 5 mg by mouth daily.    [provider]  Lacosamide 150 MG TABS Take 150 mg by mouth 2 (two) times daily.    [provider]  levETIRAcetam (KEPPRA) 500 MG tablet Take 500 mg by mouth 2 (two) times daily.    [provider]  nicotine (NICODERM CQ - DOSED IN MG/24 HOURS) 14 mg/24hr patch Place 14 mg onto the skin daily.    [provider]  rosuvastatin (CRESTOR) 20 MG tablet Take 20 mg by mouth daily.    [provider]  Semaglutide (RYBELSUS) 7 MG TABS Take 7 mg by mouth daily.    [provider]  Tiotropium Bromide Monohydrate (SPIRIVA RESPIMAT) 1.25 MCG/ACT AERS Inhale 2 puffs into the lungs daily.    [provider]     Allergies    Contrast media [iodinated contrast media]   Review of Systems   Review of Systems Please see HPI for  pertinent positives and negatives  Physical Exam BP 129/89 (BP Location: Left Arm)    Pulse 71    Temp 98.2 F (36.8 C) (Oral)    Resp 18    SpO2 91%   Physical Exam Vitals and nursing note reviewed.  Constitutional:      Appearance: Normal appearance.  HENT:     Head: Normocephalic and atraumatic.     Nose: Nose normal.     Mouth/Throat:     Mouth: Mucous membranes are moist.  Eyes:     Extraocular Movements: Extraocular movements intact.     Conjunctiva/sclera: Conjunctivae normal.  Cardiovascular:     Rate and Rhythm: Normal rate.  Pulmonary:     Effort: Pulmonary effort is normal.     Breath sounds:  Normal breath sounds.  Abdominal:     General: Abdomen is flat.     Palpations: Abdomen is soft.     Tenderness: There is no abdominal tenderness.  Musculoskeletal:        General: No swelling. Normal range of motion.     Cervical back: Neck supple.  Skin:    General: Skin is warm and dry.  Neurological:     General: No focal deficit present.     Mental Status: He is alert.  Psychiatric:     Comments: Labile affect, tangential speech    ED Results / Procedures / Treatments   EKG None  Procedures Procedures  Medications Ordered in the ED Medications  sodium chloride 0.9 % bolus 1,000 mL (has no administration in time range)    Initial Impression and Plan  Patient with underlying psychiatric disorder here after walking out of his group home. He has other chronic medical conditions which are reasonably well controlled with meds at his group home. He had labs done in triage showing a moderate hyponatremia. He has been lower before, requiring medical admission but not as profound at this visit. Could be medication vs dietary. Will give a liter of fluids pending TTS consult. His APAP, ASA and EtoH levels are normal. CBC is unremarkable. UDS is neg. Will add depakote level. Otherwise he is ready for initial TTS evaluation.   ED Course   Clinical Course as of 01/08/21 2351  Tue Jan 08, 2021  2338 Patient still awaiting IVF and TTS consult. Has not had depakote or Covid swabs collected yet. Care of the patient will be signed out to the oncoming team at shift change.  [CS]    Clinical Course User Index [CS] Pollyann Savoy, MD     MDM Rules/Calculators/A&P Medical Decision Making Problems Addressed: Hyponatremia: chronic illness or injury with exacerbation, progression, or side effects of treatment  Amount and/or Complexity of Data Reviewed Independent Historian:     Details: IVC paperwork Labs: ordered. Decision-making details documented in ED  Course.  Risk Prescription drug management. Decision regarding hospitalization.    Final Clinical Impression(s) / ED Diagnoses Final diagnoses:  Schizophrenia, unspecified type (HCC)  Hyponatremia    Rx / DC Orders ED Discharge Orders     None        Pollyann Savoy, MD 01/08/21 2351

## 2021-01-08 NOTE — ED Notes (Signed)
IVC PAPERWORK SENT TO [facility] AT [phone]

## 2021-01-08 NOTE — ED Provider Notes (Signed)
Emergency Medicine Provider Triage Evaluation Note  Daniel Foley , a 51 y.o. male  was evaluated in triage.  Patient brought in by [employer] from group home under IVC.  For IVC paperwork: "Respondent is schizophrenic, has diabetes, chronic kidney disease and seizures.  Takes medication daily.  Respondent walks off from facility into heavy traffic.  Respondent has said that he sees old friends and family and they tell him to do bad things.  Respondent mumble to himself but now he full blown out talks to himself.  Respondent has been aggressive with staff and threatening."  When asked why he is here patient states "there was weed that the only way to get rid of it was to eat it".  When asked about suicidal ideation he states "yes, there is bad people in the house".  He will not elaborate further.  Denies homicidal ideation.  When asked about visual or auditory hallucinations patient states "yes, there is a bad atmosphere".  He has no other complaints.    Review of Systems  As above  Physical Exam  BP 129/89 (BP Location: Left Arm)    Pulse 71    Temp 98.2 F (36.8 C) (Oral)    Resp 18    SpO2 91%  Gen:   Awake, no distress   Resp:  Normal effort  MSK:   Moves extremities without difficulty  Other:    Medical Decision Making  Medically screening exam initiated at 7:04 PM.  Appropriate orders placed.  Rishikesh Khachatryan was informed that the remainder of the evaluation will be completed by another provider, this initial triage assessment does not replace that evaluation, and the importance of remaining in the ED until their evaluation is complete.     Jeanella Flattery 01/08/21 1907    Mancel Bale, MD 01/09/21 1140

## 2021-01-08 NOTE — ED Triage Notes (Signed)
Pt BIB [employer] from group home. IVC paperwork with them, pt walked off from facility, says he sees old friends, telling him to do bad things. Aggressive with staff

## 2021-01-09 LAB — RESP PANEL BY RT-PCR (FLU A&B, COVID) ARPGX2
Influenza A by PCR: NEGATIVE
Influenza B by PCR: NEGATIVE
SARS Coronavirus 2 by RT PCR: NEGATIVE

## 2021-01-09 LAB — COMPREHENSIVE METABOLIC PANEL
ALT: 11 U/L (ref 0–44)
AST: 18 U/L (ref 15–41)
Albumin: 3.6 g/dL (ref 3.5–5.0)
Alkaline Phosphatase: 43 U/L (ref 38–126)
Anion gap: 6 (ref 5–15)
BUN: 9 mg/dL (ref 6–20)
CO2: 28 mmol/L (ref 22–32)
Calcium: 9.8 mg/dL (ref 8.9–10.3)
Chloride: 105 mmol/L (ref 98–111)
Creatinine, Ser: 1.12 mg/dL (ref 0.61–1.24)
GFR, Estimated: >60 mL/min (ref >60)
Glucose, Bld: 143 mg/dL — ABNORMAL HIGH (ref 70–99)
Potassium: 4.2 mmol/L (ref 3.5–5.1)
Sodium: 139 mmol/L (ref 135–145)
Total Bilirubin: 0.4 mg/dL (ref 0.3–1.2)
Total Protein: 7.9 g/dL (ref 6.5–8.1)

## 2021-01-09 LAB — VALPROIC ACID LEVEL: Valproic Acid Lvl: 31 ug/mL — ABNORMAL LOW (ref 50.0–100.0)

## 2021-01-09 MED ORDER — BENZTROPINE MESYLATE 1 MG PO TABS
1.0000 mg | ORAL_TABLET | Freq: Every day | ORAL | Status: DC
  Administered 2021-01-09 – 2021-01-10 (×2): 1 mg via ORAL
  Filled 2021-01-09 (×2): qty 1

## 2021-01-09 MED ORDER — HALOPERIDOL 5 MG PO TABS
5.0000 mg | ORAL_TABLET | Freq: Every day | ORAL | Status: DC
  Administered 2021-01-09 – 2021-01-10 (×2): 5 mg via ORAL
  Filled 2021-01-09 (×2): qty 1

## 2021-01-09 MED ORDER — DIVALPROEX SODIUM 500 MG PO DR TAB
1000.0000 mg | DELAYED_RELEASE_TABLET | Freq: Two times a day (BID) | ORAL | Status: DC
  Administered 2021-01-09 – 2021-01-11 (×4): 1000 mg via ORAL
  Filled 2021-01-09 (×4): qty 2

## 2021-01-09 MED ORDER — LEVETIRACETAM 500 MG PO TABS
500.0000 mg | ORAL_TABLET | Freq: Two times a day (BID) | ORAL | Status: DC
  Administered 2021-01-09 – 2021-01-11 (×4): 500 mg via ORAL
  Filled 2021-01-09 (×4): qty 1

## 2021-01-09 NOTE — ED Notes (Signed)
Patient dressed out in burgundy scrub top and blue paper scrubs pants. Two labeled patient belongings bags placed in the 5-8 cabinet at nursing station with his shoes, socks, belt, jeans, and shirt. No other belongings noted.

## 2021-01-09 NOTE — ED Provider Notes (Signed)
Patient received a liter of fluids for hyponatremia.  Depakote level is subtherapeutic.  He remains asymptomatic.  Patient is medically cleared for TTS evaluation.  Physical Exam  BP (!) 126/93 (BP Location: Right Arm)    Pulse 73    Temp 97.7 F (36.5 C) (Oral)    Resp 15    SpO2 98%   Physical Exam  ED Course/Procedures   Clinical Course as of 01/09/21 0637  Tue Jan 08, 2021  2338 Patient still awaiting IVF and TTS consult. Has not had depakote or Covid swabs collected yet. Care of the patient will be signed out to the oncoming team at shift change.  [CS]    Clinical Course User Index [CS] Truddie Hidden, MD    Procedures  MDM         Merryl Hacker, MD 01/09/21 [phone]

## 2021-01-09 NOTE — BH Assessment (Signed)
[facility] Assessment Progress Note   Per Sheran Fava, NP, this pt requires psychiatric hospitalization at this time.  Pt presents under IVC initiated by pt's caregiver and upheld by EDP Calvert Cantor, MD.  At the direction of Hampton Abbot, MD this writer has sought placement for this patient at facilities outside of the [facility] system.  The following facilities have been contacted to seek placement for this pt, with results as noted:   Beds available, information sent, decision pending: [place]: [place]   At capacity: [facility]  Jalene Mullet, [place] Coordinator [phone]

## 2021-01-10 DIAGNOSIS — F209 Schizophrenia, unspecified: Secondary | ICD-10-CM

## 2021-01-10 NOTE — BH Assessment (Signed)
[facility] Assessment Progress Note  Per Caryn Bee, NP this involuntary pt requires psychiatric hospitalization at this time.  Linsey, RN, AC reports that she will be able to accommodate pt.  This Clinical research associate has handed off to [employer].  Doylene Canning, [place] [facility] Coordinator [phone]

## 2021-01-10 NOTE — ED Notes (Signed)
Report called to [facility]. Staff requesting transport call be placed at 0000.

## 2021-01-10 NOTE — Progress Notes (Signed)
Tried to call report to [phone] , waited on hold for over 15 minutes , will wait for call back to Adult unit: [phone]

## 2021-01-10 NOTE — Consult Note (Signed)
[facility] Psych ED Progress Note  01/10/2021 11:17 AM Daniel Foley  MRN:  [id number]   Method of visit?: Face to Face   Subjective:   Daniel Foley is a 51 year old male presenting under IVC to [facility] due to delusions.  On today's reassessment patient does remain alert yet disoriented and confused.  Patient presents with loose associations, is unable to hold a linear conversation.  Patient is able to identify he is in a hospital, unable to recall the name.  He is unable to identify the president, date, or event.  When asked the patient what brought him to the hospital he states " I can smoke a cigarette."  When assessing for previous psychiatric services patient states yes I used to see the psychiatrist for my legs and feet.  Patient was redirected ask question again, which he states again" I seen a psychiatrist for my legs and my feet.  That is what they were treating me for."  Patient denies any current suicidal ideations, homicidal ideations, and or auditory or visual hallucinations.  Per involuntary commitment patient has history of schizophrenia, walked away from his group home into heavy traffic.  Patient also endorses seeing old friends, and having command hallucinations. "  Patient appears to have acute change in previous psychiatric condition, that will require inpatient stay for crisis stabilization, medication management and adjustment, and behavior therapy.  Principal Problem: Schizophrenia ([place]) Diagnosis:  Principal Problem:   Schizophrenia ([place])  Total Time spent with patient: 30 minutes  Past Psychiatric History:   Past Medical History:  Past Medical History:  Diagnosis Date   Schizophrenia ([place])    History reviewed. No pertinent surgical history. Family History:  Family History  Problem Relation Age of Onset   Seizures Brother    Family Psychiatric  History:  Social History:  Social History   Substance and Sexual Activity  Alcohol Use Not Currently     Social History    Substance and Sexual Activity  Drug Use Not Currently    Social History   Socioeconomic History   Marital status: Unknown    Spouse name: Not on file   Number of children: Not on file   Years of education: Not on file   Highest education level: Not on file  Occupational History   Not on file  Tobacco Use   Smoking status: Every Day    Types: Cigarettes   Smokeless tobacco: Never  Substance and Sexual Activity   Alcohol use: Not Currently   Drug use: Not Currently   Sexual activity: Not Currently  Other Topics Concern   Not on file  Social History Narrative   Not on file   Social Determinants of Health   Financial Resource Strain: Not on file  Food Insecurity: Not on file  Transportation Needs: Not on file  Physical Activity: Not on file  Stress: Not on file  Social Connections: Not on file    Sleep: Fair  Appetite:  Fair  Current Medications: Current Facility-Administered Medications  Medication Dose Route Frequency Provider Last Rate Last Admin   benztropine (COGENTIN) tablet 1 mg  1 mg Oral Daily Suella Broad, FNP   1 mg at 01/10/21 [id number]   divalproex (DEPAKOTE) DR tablet 1,000 mg  1,000 mg Oral BID Suella Broad, FNP   1,000 mg at 01/10/21 [id number]   haloperidol (HALDOL) tablet 5 mg  5 mg Oral Daily Suella Broad, FNP   5 mg at 01/10/21 0922   levETIRAcetam (KEPPRA)  tablet 500 mg  500 mg Oral BID Suella Broad, FNP   500 mg at 01/10/21 [id number]   Current Outpatient Medications  Medication Sig Dispense Refill   albuterol (VENTOLIN HFA) 108 (90 Base) MCG/ACT inhaler Inhale 2 puffs into the lungs every 4 (four) hours as needed for shortness of breath.     benztropine (COGENTIN) 1 MG tablet Take 1 mg by mouth daily.     budesonide-formoterol (SYMBICORT) 160-4.5 MCG/ACT inhaler Inhale 2 puffs into the lungs 2 (two) times daily.     cholecalciferol (VITAMIN D) 25 MCG (1000 UNIT) tablet Take 1,000 Units by mouth daily.      dapagliflozin propanediol (FARXIGA) 10 MG TABS tablet Take 10 mg by mouth daily.     Deutetrabenazine (AUSTEDO) 6 MG TABS Take 6 mg by mouth in the morning and at bedtime.     divalproex (DEPAKOTE ER) 500 MG 24 hr tablet Take 1,000 mg by mouth in the morning and at bedtime.     ferrous sulfate 325 (65 FE) MG tablet Take 325 mg by mouth daily with breakfast.     Finerenone (KERENDIA) 10 MG TABS Take 10 mg by mouth daily.     haloperidol (HALDOL) 5 MG tablet Take 5 mg by mouth daily.     Lacosamide 150 MG TABS Take 150 mg by mouth 2 (two) times daily.     levETIRAcetam (KEPPRA) 500 MG tablet Take 500 mg by mouth 2 (two) times daily.     nicotine (NICODERM CQ - DOSED IN MG/24 HOURS) 14 mg/24hr patch Place 14 mg onto the skin daily.     rosuvastatin (CRESTOR) 20 MG tablet Take 20 mg by mouth daily.     Semaglutide (RYBELSUS) 7 MG TABS Take 7 mg by mouth daily.     Tiotropium Bromide Monohydrate (SPIRIVA RESPIMAT) 1.25 MCG/ACT AERS Inhale 2 puffs into the lungs daily.      Lab Results:  Results for orders placed or performed during the hospital encounter of 01/08/21 (from the past 48 hour(s))  Comprehensive metabolic panel     Status: Abnormal   Collection Time: 01/08/21  8:10 PM  Result Value Ref Range   Sodium 127 (L) 135 - 145 mmol/L   Potassium 3.4 (L) 3.5 - 5.1 mmol/L   Chloride 94 (L) 98 - 111 mmol/L   CO2 26 22 - 32 mmol/L   Glucose, Bld 97 70 - 99 mg/dL    Comment: Glucose reference range applies only to samples taken after fasting for at least 8 hours.   BUN 7 6 - 20 mg/dL   Creatinine, Ser 1.03 0.61 - 1.24 mg/dL   Calcium 9.4 8.9 - 10.3 mg/dL   Total Protein 8.4 (H) 6.5 - 8.1 g/dL   Albumin 4.2 3.5 - 5.0 g/dL   AST 19 15 - 41 U/L   ALT 12 0 - 44 U/L   Alkaline Phosphatase 45 38 - 126 U/L   Total Bilirubin 0.5 0.3 - 1.2 mg/dL   GFR, Estimated >60 >60 mL/min    Comment: (NOTE) Calculated using the CKD-EPI Creatinine Equation (2021)    Anion gap 7 5 - 15    Comment: Performed  at [facility], [place] [address], [place], [place] [address]  Ethanol     Status: None   Collection Time: 01/08/21  8:10 PM  Result Value Ref Range   Alcohol, Ethyl (B) <10 <10 mg/dL    Comment: (NOTE) Lowest detectable limit for serum alcohol is 10 mg/dL.  For medical purposes only. Performed at [facility], [place] [address], [place], [place] [id number]   Salicylate level     Status: Abnormal   Collection Time: 01/08/21  8:10 PM  Result Value Ref Range   Salicylate Lvl [id number] (L) 7.0 - 30.0 mg/dL    Comment: Performed at [facility], [place] [address], [place], [place] [address]  Acetaminophen level     Status: Abnormal   Collection Time: 01/08/21  8:10 PM  Result Value Ref Range   Acetaminophen (Tylenol), Serum <10 (L) 10 - 30 ug/mL    Comment: (NOTE) Therapeutic concentrations vary significantly. A range of 10-30 ug/mL  may be an effective concentration for many patients. However, some  are best treated at concentrations outside of this range. Acetaminophen concentrations >150 ug/mL at 4 hours after ingestion  and >50 ug/mL at 12 hours after ingestion are often associated with  toxic reactions.  Performed at [facility], [place] [address] [place], [place] [address]   cbc     Status: None   Collection Time: 01/08/21  8:10 PM  Result Value Ref Range   WBC 7.5 4.0 - 10.5 K/uL   RBC 4.37 4.22 - 5.81 MIL/uL   Hemoglobin 13.3 13.0 - 17.0 g/dL   HCT 39.2 39.0 - 52.0 %   MCV 89.7 80.0 - 100.0 fL   MCH 30.4 26.0 - 34.0 pg   MCHC 33.9 30.0 - 36.0 g/dL   RDW 12.8 11.5 - 15.5 %   Platelets 216 150 - 400 K/uL   nRBC 0.0 0.0 - 0.2 %    Comment: Performed at [facility], [place] [address], [place], [place] [address]  Rapid urine drug screen (hospital performed)     Status: None   Collection Time: 01/08/21  8:36 PM  Result Value Ref Range   Opiates NONE DETECTED NONE DETECTED   Cocaine NONE  DETECTED NONE DETECTED   Benzodiazepines NONE DETECTED NONE DETECTED   Amphetamines NONE DETECTED NONE DETECTED   Tetrahydrocannabinol NONE DETECTED NONE DETECTED   Barbiturates NONE DETECTED NONE DETECTED    Comment: (NOTE) DRUG SCREEN FOR MEDICAL PURPOSES ONLY.  IF CONFIRMATION IS NEEDED FOR ANY PURPOSE, NOTIFY LAB WITHIN 5 DAYS.  LOWEST DETECTABLE LIMITS FOR URINE DRUG SCREEN Drug Class                     Cutoff (ng/mL) Amphetamine and metabolites    1000 Barbiturate and metabolites    200 Benzodiazepine                 [id number] Tricyclics and metabolites     300 Opiates and metabolites        300 Cocaine and metabolites        300 THC                            50 Performed at [facility], [place] [address], [place], [place] [address]   Valproic acid level     Status: Abnormal   Collection Time: 01/09/21 12:01 AM  Result Value Ref Range   Valproic Acid Lvl 31 (L) 50.0 - 100.0 ug/mL    Comment: Performed at [facility], [place] [address], [place], [place] [address]  Comprehensive metabolic panel     Status: Abnormal   Collection Time: 01/09/21  9:46 AM  Result Value Ref Range   Sodium 139  135 - 145 mmol/L    Comment: DELTA CHECK NOTED   Potassium 4.2 3.5 - 5.1 mmol/L    Comment: DELTA CHECK NOTED NO VISIBLE HEMOLYSIS    Chloride 105 98 - 111 mmol/L   CO2 28 22 - 32 mmol/L   Glucose, Bld 143 (H) 70 - 99 mg/dL    Comment: Glucose reference range applies only to samples taken after fasting for at least 8 hours.   BUN 9 6 - 20 mg/dL   Creatinine, Ser 1.12 0.61 - 1.24 mg/dL   Calcium 9.8 8.9 - 10.3 mg/dL   Total Protein 7.9 6.5 - 8.1 g/dL   Albumin 3.6 3.5 - 5.0 g/dL   AST 18 15 - 41 U/L   ALT 11 0 - 44 U/L   Alkaline Phosphatase 43 38 - 126 U/L   Total Bilirubin 0.4 0.3 - 1.2 mg/dL   GFR, Estimated >60 >60 mL/min    Comment: (NOTE) Calculated using the CKD-EPI Creatinine Equation (2021)    Anion gap 6 5 - 15    Comment: Performed at  [facility], [place] [address], [place], [place] [address]    Blood Alcohol level:  Lab Results  Component Value Date   ETH <10 01/08/2021   ETH <10 08/14/2020    Physical Findings: AIMS:  , ,  ,  ,    CIWA:    COWS:     Musculoskeletal: Strength & Muscle Tone: within normal limits Gait & Station: normal Patient leans: N/A  Psychiatric Specialty Exam:  Presentation  General Appearance: Disheveled  Eye Contact:Fleeting  Speech:Clear and Coherent; Normal Rate  Speech Volume:Normal  Handedness:Right   Mood and Affect  Mood:Euphoric  Affect:Inappropriate   Thought Process  Thought Processes:Disorganized; Irrevelant  Descriptions of Associations:Loose  Orientation:Partial (knows he is in a hospital)  Thought Content:Logical  History of Schizophrenia/Schizoaffective disorder:No data recorded Duration of Psychotic Symptoms:Greater than six months  Hallucinations:Hallucinations: None  Ideas of Reference:Delusions; Percusatory  Suicidal Thoughts:Suicidal Thoughts: No  Homicidal Thoughts:Homicidal Thoughts: No   Sensorium  Memory:Immediate Poor; Recent Poor; Remote Poor  Judgment:Poor  Insight:Lacking   Executive Functions  Concentration:Poor  Attention Span:Poor  Recall:Poor  Fund of Knowledge:Poor  Language:Poor   Psychomotor Activity  Psychomotor Activity:Psychomotor Activity: Normal   Assets  Assets:Communication Skills; Desire for Improvement; Resilience; Social Support   Sleep  Sleep:Sleep: Fair    Physical Exam: Physical Exam Vitals and nursing note reviewed.  Constitutional:      Appearance: Normal appearance. He is normal weight.  HENT:     Head: Normocephalic.  Eyes:     Pupils: Pupils are equal, round, and reactive to light.  Skin:    General: Skin is warm.     Capillary Refill: Capillary refill takes less than 2 seconds.  Neurological:     General: No focal deficit present.     Mental  Status: He is alert. Mental status is at baseline. He is disoriented.  Psychiatric:        Attention and Perception: He is inattentive.        Mood and Affect: Affect is inappropriate.        Speech: Speech is delayed.        Behavior: Behavior normal. Behavior is cooperative.        Thought Content: Thought content is paranoid and delusional.        Cognition and Memory: Memory is impaired. He exhibits impaired recent memory.        Judgment: Judgment  is inappropriate.   Review of Systems  Psychiatric/Behavioral:  Negative for depression, hallucinations, memory loss, substance abuse and suicidal ideas. The patient is nervous/anxious. The patient does not have insomnia.   All other systems reviewed and are negative. Blood pressure 106/62, pulse (!) 56, temperature 98.1 F (36.7 C), temperature source Oral, resp. rate 17, SpO2 100 %. There is no height or weight on file to calculate BMI.  Treatment Plan Summary: Plan    Patient continues to meet inpatient criteria at this time. Continue current medication regimen. Depakote levels obtained (31 ). Patient has time and involuntary commitment at this time. Will continue to refer for inpatient psychiatric admission. Suella Broad, FNP  01/10/2021, 11:17 AM

## 2021-01-10 NOTE — Progress Notes (Signed)
Pt was accepted to [facility] 01/10/2021 at 2200; Bed Assignment 502-1.  Pt meets inpatient criteria per Sheran Fava, NP   Attending Physician will be Dr. Janine Limbo  Report can be called to: -Adult unit: [phone]  Pt can arrive after [place] Team notified via secure chat: Sheran Fava, FNP, Ruben Im, RN, [facility] [place] Reader, RN, Larry Sierras, and Wynonia Hazard, RN.  [place], RN request that IVC paperwork be faxed to [phone]. CSW request nursing to coordinate transport.    Nadara Mode, LCSWA 01/10/2021 @ 4:53 PM

## 2021-01-10 NOTE — ED Provider Notes (Signed)
Emergency Medicine Observation Re-evaluation Note  Daniel Foley is a 51 y.o. male, seen on rounds today.  Pt initially presented to the ED for complaints of Medical Clearance and IVC Currently, the patient is resting comfortably.  Physical Exam  BP 106/62    Pulse (!) 56    Temp 98.1 F (36.7 C) (Oral)    Resp 17    SpO2 100%  Physical Exam  ED Course / MDM  EKG:   I have reviewed the labs performed to date as well as medications administered while in observation.  Recent changes in the last 24 hours include none.  Plan  Current plan is for placement. Daniel Foley is under involuntary commitment.      Lorre Nick, MD 01/10/21 [phone]

## 2021-01-10 NOTE — ED Notes (Signed)
[employer] contacted, transport arranged for patient over to [facility].

## 2021-01-11 ENCOUNTER — Encounter (HOSPITAL_COMMUNITY): Payer: Self-pay | Admitting: Family

## 2021-01-11 ENCOUNTER — Encounter (HOSPITAL_COMMUNITY): Payer: Self-pay

## 2021-01-11 ENCOUNTER — Other Ambulatory Visit: Payer: Self-pay

## 2021-01-11 ENCOUNTER — Inpatient Hospital Stay (HOSPITAL_COMMUNITY)
Admission: AD | Admit: 2021-01-11 | Discharge: 2021-01-25 | DRG: 885 | Disposition: A | Discharge status: Home / Self Care | Payer: Federal, State, Local not specified - Other | Source: Intra-hospital | Attending: Emergency Medicine | Admitting: Emergency Medicine

## 2021-01-11 DIAGNOSIS — F29 Unspecified psychosis not due to a substance or known physiological condition: Secondary | ICD-10-CM | POA: Diagnosis present

## 2021-01-11 DIAGNOSIS — F200 Paranoid schizophrenia: Secondary | ICD-10-CM | POA: Diagnosis not present

## 2021-01-11 DIAGNOSIS — E871 Hypo-osmolality and hyponatremia: Secondary | ICD-10-CM | POA: Diagnosis present

## 2021-01-11 DIAGNOSIS — N62 Hypertrophy of breast: Secondary | ICD-10-CM | POA: Diagnosis present

## 2021-01-11 DIAGNOSIS — F03A3 Unspecified dementia, mild, with mood disturbance: Secondary | ICD-10-CM | POA: Diagnosis present

## 2021-01-11 DIAGNOSIS — Z23 Encounter for immunization: Secondary | ICD-10-CM

## 2021-01-11 DIAGNOSIS — E785 Hyperlipidemia, unspecified: Secondary | ICD-10-CM | POA: Diagnosis present

## 2021-01-11 DIAGNOSIS — G2119 Other drug induced secondary parkinsonism: Secondary | ICD-10-CM | POA: Diagnosis present

## 2021-01-11 DIAGNOSIS — F05 Delirium due to known physiological condition: Secondary | ICD-10-CM | POA: Diagnosis present

## 2021-01-11 DIAGNOSIS — F03A0 Unspecified dementia, mild, without behavioral disturbance, psychotic disturbance, mood disturbance, and anxiety: Secondary | ICD-10-CM

## 2021-01-11 DIAGNOSIS — F17210 Nicotine dependence, cigarettes, uncomplicated: Secondary | ICD-10-CM | POA: Diagnosis present

## 2021-01-11 DIAGNOSIS — F209 Schizophrenia, unspecified: Principal | ICD-10-CM | POA: Diagnosis present

## 2021-01-11 DIAGNOSIS — F1490 Cocaine use, unspecified, uncomplicated: Secondary | ICD-10-CM | POA: Diagnosis present

## 2021-01-11 DIAGNOSIS — G40A09 Absence epileptic syndrome, not intractable, without status epilepticus: Secondary | ICD-10-CM | POA: Diagnosis present

## 2021-01-11 DIAGNOSIS — Z720 Tobacco use: Secondary | ICD-10-CM | POA: Diagnosis present

## 2021-01-11 DIAGNOSIS — Z79899 Other long term (current) drug therapy: Secondary | ICD-10-CM | POA: Diagnosis not present

## 2021-01-11 DIAGNOSIS — J449 Chronic obstructive pulmonary disease, unspecified: Secondary | ICD-10-CM | POA: Diagnosis present

## 2021-01-11 DIAGNOSIS — E11649 Type 2 diabetes mellitus with hypoglycemia without coma: Secondary | ICD-10-CM | POA: Diagnosis present

## 2021-01-11 HISTORY — DX: Myoneural disorder, unspecified: G70.9

## 2021-01-11 HISTORY — DX: Chronic kidney disease, unspecified: N18.9

## 2021-01-11 HISTORY — DX: Type 2 diabetes mellitus without complications: E11.9

## 2021-01-11 LAB — HEMOGLOBIN A1C
Hgb A1c MFr Bld: 5.8 % — ABNORMAL HIGH (ref 4.8–5.6)
Mean Plasma Glucose: 119.76 mg/dL

## 2021-01-11 LAB — LIPID PANEL
Cholesterol: 152 mg/dL (ref 0–200)
HDL: 50 mg/dL (ref >40)
LDL Cholesterol: 76 mg/dL (ref 0–99)
Total CHOL/HDL Ratio: 3 RATIO
Triglycerides: 130 mg/dL (ref <150)
VLDL: 26 mg/dL (ref 0–40)

## 2021-01-11 LAB — TSH: TSH: 3.155 u[IU]/mL (ref 0.350–4.500)

## 2021-01-11 MED ORDER — LEVETIRACETAM 500 MG PO TABS
500.0000 mg | ORAL_TABLET | Freq: Two times a day (BID) | ORAL | Status: DC
  Administered 2021-01-11 – 2021-01-25 (×29): 500 mg via ORAL
  Filled 2021-01-11 (×33): qty 1

## 2021-01-11 MED ORDER — LORAZEPAM 1 MG PO TABS
1.0000 mg | ORAL_TABLET | ORAL | Status: AC | PRN
  Administered 2021-01-20: 1 mg via ORAL
  Filled 2021-01-11: qty 1

## 2021-01-11 MED ORDER — LACOSAMIDE 50 MG PO TABS
150.0000 mg | ORAL_TABLET | Freq: Two times a day (BID) | ORAL | Status: DC
  Administered 2021-01-11 – 2021-01-25 (×28): 150 mg via ORAL
  Filled 2021-01-11 (×28): qty 3

## 2021-01-11 MED ORDER — DIVALPROEX SODIUM 500 MG PO DR TAB
1000.0000 mg | DELAYED_RELEASE_TABLET | Freq: Two times a day (BID) | ORAL | Status: DC
  Administered 2021-01-11 – 2021-01-21 (×22): 1000 mg via ORAL
  Filled 2021-01-11 (×29): qty 2

## 2021-01-11 MED ORDER — OLANZAPINE 5 MG PO TBDP
5.0000 mg | ORAL_TABLET | Freq: Two times a day (BID) | ORAL | Status: DC
  Administered 2021-01-11 – 2021-01-13 (×4): 5 mg via ORAL
  Filled 2021-01-11 (×11): qty 1

## 2021-01-11 MED ORDER — AMANTADINE HCL 100 MG PO CAPS
100.0000 mg | ORAL_CAPSULE | Freq: Two times a day (BID) | ORAL | Status: DC
  Administered 2021-01-11 – 2021-01-16 (×10): 100 mg via ORAL
  Filled 2021-01-11 (×13): qty 1

## 2021-01-11 MED ORDER — ROSUVASTATIN CALCIUM 20 MG PO TABS
20.0000 mg | ORAL_TABLET | Freq: Every day | ORAL | Status: DC
  Administered 2021-01-11: 20 mg via ORAL
  Filled 2021-01-11 (×3): qty 1

## 2021-01-11 MED ORDER — NICOTINE 14 MG/24HR TD PT24
14.0000 mg | MEDICATED_PATCH | Freq: Every day | TRANSDERMAL | Status: DC
  Administered 2021-01-11 – 2021-01-25 (×13): 14 mg via TRANSDERMAL
  Filled 2021-01-11 (×18): qty 1

## 2021-01-11 MED ORDER — DAPAGLIFLOZIN PROPANEDIOL 10 MG PO TABS
10.0000 mg | ORAL_TABLET | Freq: Every day | ORAL | Status: DC
  Administered 2021-01-11 – 2021-01-25 (×15): 10 mg via ORAL
  Filled 2021-01-11 (×19): qty 1

## 2021-01-11 MED ORDER — OLANZAPINE 5 MG PO TBDP
5.0000 mg | ORAL_TABLET | Freq: Three times a day (TID) | ORAL | Status: DC | PRN
  Administered 2021-01-20: 5 mg via ORAL
  Filled 2021-01-11: qty 1

## 2021-01-11 MED ORDER — ROSUVASTATIN CALCIUM 20 MG PO TABS
20.0000 mg | ORAL_TABLET | Freq: Every evening | ORAL | Status: DC
  Administered 2021-01-12 – 2021-01-24 (×12): 20 mg via ORAL
  Filled 2021-01-11 (×16): qty 1

## 2021-01-11 MED ORDER — ALUM & MAG HYDROXIDE-SIMETH 200-200-20 MG/5ML PO SUSP: 30.0000 mL | ORAL | Status: DC | PRN

## 2021-01-11 MED ORDER — BENZTROPINE MESYLATE 0.5 MG PO TABS
0.5000 mg | ORAL_TABLET | Freq: Every day | ORAL | Status: DC
  Filled 2021-01-11: qty 1

## 2021-01-11 MED ORDER — PNEUMOCOCCAL VAC POLYVALENT 25 MCG/0.5ML IJ INJ
0.5000 mL | INJECTION | INTRAMUSCULAR | Status: AC
  Administered 2021-01-12: 0.5 mL via INTRAMUSCULAR
  Filled 2021-01-11: qty 0.5

## 2021-01-11 MED ORDER — ACETAMINOPHEN 325 MG PO TABS: 650.0000 mg | ORAL_TABLET | Freq: Four times a day (QID) | ORAL | Status: DC | PRN

## 2021-01-11 MED ORDER — HALOPERIDOL 5 MG PO TABS
5.0000 mg | ORAL_TABLET | Freq: Every day | ORAL | Status: DC
  Administered 2021-01-11: 5 mg via ORAL
  Filled 2021-01-11 (×3): qty 1

## 2021-01-11 MED ORDER — BENZTROPINE MESYLATE 0.5 MG PO TABS
0.5000 mg | ORAL_TABLET | Freq: Every day | ORAL | Status: AC
  Administered 2021-01-12 – 2021-01-13 (×2): 0.5 mg via ORAL
  Filled 2021-01-11 (×2): qty 1

## 2021-01-11 MED ORDER — INFLUENZA VAC SPLIT QUAD 0.5 ML IM SUSY
0.5000 mL | PREFILLED_SYRINGE | INTRAMUSCULAR | Status: AC
Start: 2021-01-12 — End: 2021-01-12
  Administered 2021-01-12: 0.5 mL via INTRAMUSCULAR
  Filled 2021-01-11: qty 0.5

## 2021-01-11 MED ORDER — BENZTROPINE MESYLATE 1 MG PO TABS
1.0000 mg | ORAL_TABLET | Freq: Every day | ORAL | Status: DC
  Administered 2021-01-11: 1 mg via ORAL
  Filled 2021-01-11 (×3): qty 1

## 2021-01-11 MED ORDER — MAGNESIUM HYDROXIDE 400 MG/5ML PO SUSP: 30.0000 mL | Freq: Every day | ORAL | Status: DC | PRN

## 2021-01-11 MED ORDER — ZIPRASIDONE MESYLATE 20 MG IM SOLR: 20.0000 mg | INTRAMUSCULAR | Status: DC | PRN

## 2021-01-11 NOTE — BHH Counselor (Signed)
Per [facility] of Courts this patient does not have a legal  guardian.     Ruthann Cancer MSW, LCSW Clincal Social Worker  [facility]

## 2021-01-11 NOTE — Progress Notes (Addendum)
Patient ID: Jordin Betancourt, male   DOB: 1970-10-14, 51 y.o.   MRN: [id number]  Admission Note:  D:50 yr male who presents IVC in no acute distress for the treatment of psychosis and aggressive behavior. Pt presented to [facility]  with IV in L-antecubital space , which was removed by Probation officer. Pt appears flat and depressed.  Pt appears to have essential tremor, Pt was calm and cooperative with admission process. Pt denies SI/ HI / AVH, but stated the AVH is getting better. Pt stated he keeps walking away from the group home and does not like it there, he says he keeps getting into fights. Pt stated he has been here 6 months , moved down here from [place] group home due to getting into fights. Pt stated he would be accommodating of a roommate if we put someone in his room .   Per Assessment: Uthman Porcher is a 51 year old male presenting under [facility] due to delusions. When asked about SI, patient stated "yes, how to be good". When asked about why he is in ED, patient stated "projects didn't play no games". Patient then stated "they wanted me to lie about staying @home ". Patient denied being aggressive with staff members. When asked, do you have any children, patient stated "been a while before I remember". Patient was not understanding questions asked. Patient was asked, do you have a psychiatrist or therapist, patient stated, "no I went to a bank before". Patient seemed to present with altered mental status. Patient was cooperative during assessment.    PER EDP 01/09/20: When asked why he is here patient states "there was weed that the only way to get rid of it was to eat it".  When asked about suicidal ideation he states "yes, there is bad people in the house".  He will not elaborate further.  Denies homicidal ideation.  When asked about visual or auditory hallucinations patient states "yes, there is a bad atmosphere".   IVC, petitioner is Elie Goody, caregiver, [phone], unable to reach at this time. Respondent  is schizophrenic, has diabetes, chronic kidney disease and seizures. Takes medication daily. Respondent walks off from facility into heavy traffic. Respondent has said that he sees ol d friends and family and they tell him to do bad things. Respondent mumbled to himself but now he full blown talks to himself. Respondent has been aggressive with staff and threatening.   A: Skin was assessed and found to be clear of any abnormal marks. PT searched and no contraband found, POC and unit policies explained and understanding verbalized. Consents obtained. Food and fluids offered, and  accepted.   R:Pt had no additional questions or concerns.

## 2021-01-11 NOTE — BHH Group Notes (Signed)
Adult Psychoeducational Group Note  Date:  01/11/2021 Time:  10:31 PM  Group Topic/Focus:  Goals Group:   The focus of this group is to help patients establish daily goals to achieve during treatment and discuss how the patient can incorporate goal setting into their daily lives to aide in recovery.  Participation Level:  Minimal  Participation Quality:  Appropriate  Affect:  Appropriate  Cognitive:  Appropriate  Insight: Lacking  Engagement in Group:  Limited  Modes of Intervention:  Discussion  Additional Comments  Jacalyn Lefevre 01/11/2021, 10:31 PM

## 2021-01-11 NOTE — Tx Team (Signed)
Initial Treatment Plan 01/11/2021 1:27 AM Daniel Foley [id number]    PATIENT STRESSORS: Marital or family conflict   Medication change or noncompliance     PATIENT STRENGTHS: General fund of knowledge  Motivation for treatment/growth    PATIENT IDENTIFIED PROBLEMS: psychosis  "No"                   DISCHARGE CRITERIA:  Adequate post-discharge living arrangements Improved stabilization in mood, thinking, and/or behavior Verbal commitment to aftercare and medication compliance  PRELIMINARY DISCHARGE PLAN: Attend aftercare/continuing care group Outpatient therapy Placement in alternative living arrangements  PATIENT/FAMILY INVOLVEMENT: This treatment plan has been presented to and reviewed with the patient, Daniel Foley.  The patient and family have been given the opportunity to ask questions and make suggestions.  Providence Crosby, RN 01/11/2021, 1:27 AM

## 2021-01-11 NOTE — BHH Suicide Risk Assessment (Signed)
BHH INPATIENT:  Family/Significant Other Suicide Prevention Education  Suicide Prevention Education:  Contact Attempts: Caregiver- Harvie Heck [phone]), has been identified by the patient as the family member/significant other with whom the patient will be residing, and identified as the person(s) who will aid the patient in the event of a mental health crisis.  With written consent from the patient, two attempts were made to provide suicide prevention education, prior to and/or following the patient's discharge.  We were unsuccessful in providing suicide prevention education.  A suicide education pamphlet was given to the patient to share with family/significant other.  Date and time of first attempt:01/11/2021 / 10:30am CSW left HIPAA compliant voicemail requesting return call.  Date and time of second attempt: Second attempt still needs to be made.   Daniel Foley A Jode Lippe 01/11/2021, 10:51 AM

## 2021-01-11 NOTE — Progress Notes (Signed)
Pt was encouraged but didn't attend therapeutic relaxation group. ?

## 2021-01-11 NOTE — BH IP Treatment Plan (Signed)
Interdisciplinary Treatment and Diagnostic Plan Update  01/11/2021 Daniel Foley MRN: [id number]  Principal Diagnosis: Schizophrenia [facility])  Secondary Diagnoses: Principal Problem:   Schizophrenia ([place])   Current Medications:  Current Facility-Administered Medications  Medication Dose Route Frequency Provider Last Rate Last Admin   acetaminophen (TYLENOL) tablet 650 mg  650 mg Oral Q6H PRN Starkes-Perry, Gayland Curry, FNP       alum & mag hydroxide-simeth (MAALOX/MYLANTA) 200-200-20 MG/5ML suspension 30 mL  30 mL Oral Q4H PRN Starkes-Perry, Gayland Curry, FNP       benztropine (COGENTIN) tablet 1 mg  1 mg Oral Daily Suella Broad, FNP   1 mg at 01/11/21 0847   dapagliflozin propanediol (FARXIGA) tablet 10 mg  10 mg Oral Daily Suella Broad, FNP   10 mg at 01/11/21 0847   divalproex (DEPAKOTE) DR tablet 1,000 mg  1,000 mg Oral BID Suella Broad, FNP   1,000 mg at 01/11/21 0847   haloperidol (HALDOL) tablet 5 mg  5 mg Oral Daily Suella Broad, FNP   5 mg at 01/11/21 0847   [START ON 01/12/2021] influenza vac split quadrivalent PF (FLUARIX) injection 0.5 mL  0.5 mL Intramuscular Tomorrow-1000 Lovena Le, Cody W, PA-C       levETIRAcetam (KEPPRA) tablet 500 mg  500 mg Oral BID Suella Broad, FNP   500 mg at 01/11/21 0847   magnesium hydroxide (MILK OF MAGNESIA) suspension 30 mL  30 mL Oral Daily PRN Suella Broad, FNP       nicotine (NICODERM CQ - dosed in mg/24 hours) patch 14 mg  14 mg Transdermal Daily Suella Broad, FNP   14 mg at 01/11/21 0846   [START ON 01/12/2021] pneumococcal 23 valent vaccine (PNEUMOVAX-23) injection 0.5 mL  0.5 mL Intramuscular Tomorrow-1000 Lovena Le, Cody W, PA-C       rosuvastatin (CRESTOR) tablet 20 mg  20 mg Oral Daily Suella Broad, FNP   20 mg at 01/11/21 0847   PTA Medications: Medications Prior to Admission  Medication Sig Dispense Refill Last Dose   albuterol (VENTOLIN HFA) 108 (90 Base) MCG/ACT inhaler  Inhale 2 puffs into the lungs every 4 (four) hours as needed for shortness of breath.      benztropine (COGENTIN) 1 MG tablet Take 1 mg by mouth daily.      budesonide-formoterol (SYMBICORT) 160-4.5 MCG/ACT inhaler Inhale 2 puffs into the lungs 2 (two) times daily.      cholecalciferol (VITAMIN D) 25 MCG (1000 UNIT) tablet Take 1,000 Units by mouth daily.      dapagliflozin propanediol (FARXIGA) 10 MG TABS tablet Take 10 mg by mouth daily.      Deutetrabenazine (AUSTEDO) 6 MG TABS Take 6 mg by mouth in the morning and at bedtime.      divalproex (DEPAKOTE ER) 500 MG 24 hr tablet Take 1,000 mg by mouth in the morning and at bedtime.      ferrous sulfate 325 (65 FE) MG tablet Take 325 mg by mouth daily with breakfast.      Finerenone (KERENDIA) 10 MG TABS Take 10 mg by mouth daily.      haloperidol (HALDOL) 5 MG tablet Take 5 mg by mouth daily.      Lacosamide 150 MG TABS Take 150 mg by mouth 2 (two) times daily.      levETIRAcetam (KEPPRA) 500 MG tablet Take 500 mg by mouth 2 (two) times daily.      nicotine (NICODERM CQ - DOSED IN MG/24 HOURS) 14  mg/24hr patch Place 14 mg onto the skin daily.      rosuvastatin (CRESTOR) 20 MG tablet Take 20 mg by mouth daily.      Semaglutide (RYBELSUS) 7 MG TABS Take 7 mg by mouth daily.      Tiotropium Bromide Monohydrate (SPIRIVA RESPIMAT) 1.25 MCG/ACT AERS Inhale 2 puffs into the lungs daily.       Patient Stressors: Marital or family conflict   Medication change or noncompliance    Patient Strengths: Psychologist, clinical for treatment/growth   Treatment Modalities: Medication Management, Group therapy, Case management,  1 to 1 session with clinician, Psychoeducation, Recreational therapy.   Physician Treatment Plan for Primary Diagnosis: Schizophrenia ([place]) Long Term Goal(s): Improvement in symptoms so as ready for discharge   Short Term Goals: Ability to identify changes in lifestyle to reduce recurrence of condition will  improve Ability to verbalize feelings will improve Ability to disclose and discuss suicidal ideas Ability to demonstrate self-control will improve Ability to identify and develop effective coping behaviors will improve Ability to maintain clinical measurements within normal limits will improve Compliance with prescribed medications will improve Ability to identify triggers associated with substance abuse/mental health issues will improve  Medication Management: Evaluate patient's response, side effects, and tolerance of medication regimen.  Therapeutic Interventions: 1 to 1 sessions, Unit Group sessions and Medication administration.  Evaluation of Outcomes: Not Met  Physician Treatment Plan for Secondary Diagnosis: Principal Problem:   Schizophrenia ([place])  Long Term Goal(s): Improvement in symptoms so as ready for discharge   Short Term Goals: Ability to identify changes in lifestyle to reduce recurrence of condition will improve Ability to verbalize feelings will improve Ability to disclose and discuss suicidal ideas Ability to demonstrate self-control will improve Ability to identify and develop effective coping behaviors will improve Ability to maintain clinical measurements within normal limits will improve Compliance with prescribed medications will improve Ability to identify triggers associated with substance abuse/mental health issues will improve     Medication Management: Evaluate patient's response, side effects, and tolerance of medication regimen.  Therapeutic Interventions: 1 to 1 sessions, Unit Group sessions and Medication administration.  Evaluation of Outcomes: Not Met   RN Treatment Plan for Primary Diagnosis: Schizophrenia ([place]) Long Term Goal(s): Knowledge of disease and therapeutic regimen to maintain health will improve  Short Term Goals: Ability to remain free from injury will improve, Ability to participate in decision making will improve, and Ability  to verbalize feelings will improve  Medication Management: RN will administer medications as ordered by provider, will assess and evaluate patient's response and provide education to patient for prescribed medication. RN will report any adverse and/or side effects to prescribing provider.  Therapeutic Interventions: 1 on 1 counseling sessions, Psychoeducation, Medication administration, Evaluate responses to treatment, Monitor vital signs and CBGs as ordered, Perform/monitor CIWA, COWS, AIMS and Fall Risk screenings as ordered, Perform wound care treatments as ordered.  Evaluation of Outcomes: Not Met   LCSW Treatment Plan for Primary Diagnosis: Schizophrenia ([place]) Long Term Goal(s): Safe transition to appropriate next level of care at discharge, Engage patient in therapeutic group addressing interpersonal concerns.  Short Term Goals: Engage patient in aftercare planning with referrals and resources, Increase social support, and Increase ability to appropriately verbalize feelings  Therapeutic Interventions: Assess for all discharge needs, 1 to 1 time with Social worker, Explore available resources and support systems, Assess for adequacy in community support network, Educate family and significant other(s) on suicide prevention, Complete Psychosocial  Assessment, Interpersonal group therapy.  Evaluation of Outcomes: Not Met   Progress in Treatment: Attending groups: No. and As evidenced by:  Pt was recently admitted Participating in groups: No. and As evidenced by:  Pt was Taking medication as prescribed: Yes. Toleration medication: Yes. Family/Significant other contact made: No, will contact:  CSW will obtain consents Patient understands diagnosis: No. Discussing patient identified problems/goals with staff: Yes. and No. Medical problems stabilized or resolved: Yes. Denies suicidal/homicidal ideation: Yes. Issues/concerns per patient self-inventory: No. Other: None  New problem(s)  identified: No, Describe:  None  New Short Term/Long Term Goal(s): medication stabilization, elimination of SI thoughts, development of comprehensive mental wellness plan.   Patient Goals:  Patient states his goal is to try to go to work.   Discharge Plan or Barriers: Patient recently admitted. CSW will continue to follow and assess for appropriate referrals and possible discharge planning.   Reason for Continuation of Hospitalization: Delusions  Hallucinations Medication stabilization  Estimated Length of Stay: 3-5 days   Scribe for Treatment Team: Eliott Nine 01/11/2021 11:32 AM

## 2021-01-11 NOTE — Progress Notes (Signed)
Dar Note: Patient presents with a calm affect and mood.  Denies suicidal thoughts, auditory and visual hallucinations.  Medications given as prescribed.  Rates depression/hopelessness/anxiety at 10/10.  Stated goal is to stay hydrated.  Attended group and participated.  Routine safety checks maintained.  Patient visible in milieu interacting with staff and peers.  Patient is safe on and off the unit.

## 2021-01-11 NOTE — Group Note (Signed)
Recreation Therapy Group Note   Group Topic:Team Building  Group Date: 01/11/2021 Start Time: 1008 End Time: 1035 Facilitators: Victorino Sparrow, LRT,CTRS Location: [address] Dayroom   Goal Area(s) Addresses:  Patient will effectively work with peer towards shared goal.  Patient will identify skills used to make activity successful.  Patient will identify how skills used during activity can be used to reach post d/c goals.   Group Description: Straw Bridge. In teams of 3-5, patients were given 15 plastic drinking straws and an equal length of masking tape. Using the materials provided, patients were instructed to build a free standing bridge-like structure to suspend an everyday item (ex: puzzle box) off of the floor or table surface. All materials were required to be used by the team in their design. LRT facilitated post-activity discussion reviewing team process. Patients were encouraged to reflect how the skills used in this activity can be generalized to daily life post discharge.    Affect/Mood: N/A   Participation Level: Did not attend    Clinical Observations/Individualized Feedback:     Plan: Continue to engage patient in RT group sessions 2-3x/week.   Victorino Sparrow, LRT,CTRS 01/11/2021 12:28 PM
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
## 2021-01-11 NOTE — Progress Notes (Signed)
Pt visible on the unit some this evening, pt stated AVH getting better    01/11/21 1900  Psych Admission Type (Psych Patients Only)  Admission Status Involuntary  Psychosocial Assessment  Patient Complaints Worrying;Suspiciousness  Eye Contact Fair  Facial Expression Anxious;Trembling lip;Worried  Affect Anxious  Speech Soft;Slow  Interaction Assertive  Motor Activity Fidgety;Tremors  Appearance/Hygiene Disheveled;In scrubs  Behavior Characteristics Cooperative  Mood Suspicious;Anxious;Pleasant  Aggressive Behavior  Effect No apparent injury  Thought Process  Coherency Circumstantial  Content Blaming others  Delusions WDL  Perception Hallucinations  Hallucination Visual;Auditory  Judgment Impaired  Confusion WDL  Danger to Self  Current suicidal ideation? Denies  Danger to Others  Danger to Others None reported or observed

## 2021-01-11 NOTE — Progress Notes (Signed)
Pt was encouraged but didn't attend orientation/goals group. ?

## 2021-01-11 NOTE — Progress Notes (Signed)
°   01/11/21 0500  Sleep  Number of Hours 4

## 2021-01-11 NOTE — Group Note (Signed)
Type of Therapy and Topic: Group Therapy: Gratitude  Participation: Minimal  Description of Group: The purpose behind this group is to get people thinking about things for which  they can be grateful. If continued over time, they might begin to spontaneously look for things and  situations for which to be grateful. Gratitude is related to a wide variety of forms of wellbeing , whereas negative attributions can adversely affect relationships.  Several studies have shown that interventions to  increase gratitude can impact areas such as overall life satisfaction, decreased negative affect, increased happiness, the ability to provide emotional support to others, and decreased worrying.  Summary of Progress: Pt shared that he did something for the president that made him happy. Pt shared turning 51 years old makes him happy.   Therapeutic Goals: Patient will learn activities that focus on gratitude in their daily lives. Patient will share gratitude in their daily lives. Patient will learn to develop healthy habits and positive thinking techniques. Patient will receive support and feedback from others   Therapeutic Modalities: Cognitive Behavioral Therapy Solution Focused Therapy Motivational Interviewing
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
## 2021-01-12 DIAGNOSIS — F209 Schizophrenia, unspecified: Secondary | ICD-10-CM | POA: Diagnosis not present

## 2021-01-12 LAB — URINALYSIS, COMPLETE (UACMP) WITH MICROSCOPIC
Bacteria, UA: NONE SEEN
Bilirubin Urine: NEGATIVE
Glucose, UA: >=500 mg/dL — AB
Hgb urine dipstick: NEGATIVE
Ketones, ur: NEGATIVE mg/dL
Leukocytes,Ua: NEGATIVE
Nitrite: NEGATIVE
Protein, ur: NEGATIVE mg/dL
Specific Gravity, Urine: 1.006 (ref 1.005–1.030)
pH: 5 (ref 5.0–8.0)

## 2021-01-12 LAB — GLUCOSE, CAPILLARY: Glucose-Capillary: 170 mg/dL — ABNORMAL HIGH (ref 70–99)

## 2021-01-12 MED ORDER — INSULIN ASPART 100 UNIT/ML IJ SOLN
0.0000 [IU] | Freq: Three times a day (TID) | INTRAMUSCULAR | Status: DC
  Administered 2021-01-14: 1 [IU] via SUBCUTANEOUS
  Administered 2021-01-14: 2 [IU] via SUBCUTANEOUS
  Administered 2021-01-14: 5 [IU] via SUBCUTANEOUS
  Administered 2021-01-15: 1 [IU] via SUBCUTANEOUS
  Administered 2021-01-15: 2 [IU] via SUBCUTANEOUS
  Administered 2021-01-15: 5 [IU] via SUBCUTANEOUS
  Administered 2021-01-16 – 2021-01-17 (×4): 2 [IU] via SUBCUTANEOUS
  Administered 2021-01-17: 3 [IU] via SUBCUTANEOUS
  Administered 2021-01-18: 1 [IU] via SUBCUTANEOUS
  Administered 2021-01-18 (×2): 2 [IU] via SUBCUTANEOUS
  Administered 2021-01-19: 1 [IU] via SUBCUTANEOUS
  Administered 2021-01-19: 2 [IU] via SUBCUTANEOUS
  Administered 2021-01-19 – 2021-01-20 (×2): 3 [IU] via SUBCUTANEOUS
  Administered 2021-01-20: 2 [IU] via SUBCUTANEOUS
  Administered 2021-01-20: 3 [IU] via SUBCUTANEOUS
  Administered 2021-01-21 (×2): 1 [IU] via SUBCUTANEOUS
  Administered 2021-01-21 – 2021-01-22 (×2): 2 [IU] via SUBCUTANEOUS
  Administered 2021-01-22: 3 [IU] via SUBCUTANEOUS
  Administered 2021-01-22: 2 [IU] via SUBCUTANEOUS
  Administered 2021-01-23 (×2): 3 [IU] via SUBCUTANEOUS
  Administered 2021-01-23 – 2021-01-24 (×3): 2 [IU] via SUBCUTANEOUS
  Administered 2021-01-24: 3 [IU] via SUBCUTANEOUS
  Administered 2021-01-25: 2 [IU] via SUBCUTANEOUS
  Administered 2021-01-25: 1 [IU] via SUBCUTANEOUS

## 2021-01-12 NOTE — Progress Notes (Addendum)
[facility] MD Progress Note  01/12/2021 4:22 PM Daniel Foley  MRN:  [id number]  Principal Problem: Schizophrenia ([place]) Diagnosis: Principal Problem:   Schizophrenia ([place])  Subjective:   Daniel Foley is a 51 y.o. male with PMHx of schizophrenia, seizures (on Keppra, Depakote, lacosamide) living at a boarding home who presented involuntarily via [employer] after attempting to walk into heavy traffic and was found to be psychotic and responding internally, now admitted for management of psychosis.  The patient's chart was reviewed and nursing notes were reviewed. Over the past 24 hrs, there were no documented behavioral issues, no PRN medications given for agitation.The patient's case was discussed in multidisciplinary team meeting.   On interview and assessment this morning the patient appears psychotic and confused.  He reports recent visual hallucinations of "people with their pants off".  He is vague in his descriptions but reports some residual paranoia on the unit.  He reports feeling electrical energy from unknown source through his hands.  He denies thought broadcasting and thought insertion/withdrawal and denies ideas of reference.  He is vague when questioned about AH. He is unable to answer any orientation questions correctly.  He denies suicidal thoughts and homicidal thoughts.  Review of systems is as below. He reports stable sleep and appetite.   Total Time Spent in Direct Patient Care: I personally spent 30 minutes on the unit in direct patient care. The direct patient care time included face-to-face time with the patient, reviewing the patient's chart, communicating with other professionals, and coordinating care. Greater than 50% of this time was spent in counseling or coordinating care with the patient regarding goals of hospitalization, psycho-education, and discharge planning needs.   Past Psychiatric History: unable to find due to poor patient recall, lack of previous documentation, and  inability to reach collateral  Past Medical History:  Past Medical History:  Diagnosis Date   Chronic kidney disease    Diabetes mellitus without complication ([place])    Neuromuscular disorder ([place])    Schizophrenia ([place])    History reviewed. No pertinent surgical history. Family History:  Family History  Problem Relation Age of Onset   Seizures Brother    Family Psychiatric  History:  see H&P  Social History:  Social History   Substance and Sexual Activity  Alcohol Use Yes     Social History   Substance and Sexual Activity  Drug Use Not Currently    Social History   Socioeconomic History   Marital status: Unknown    Spouse name: Not on file   Number of children: Not on file   Years of education: Not on file   Highest education level: Not on file  Occupational History   Not on file  Tobacco Use   Smoking status: Every Day    Packs/day: 0.25    Years: 0.50    Pack years: 0.13    Types: Cigarettes   Smokeless tobacco: Never  Vaping Use   Vaping Use: Never used  Substance and Sexual Activity   Alcohol use: Yes   Drug use: Not Currently   Sexual activity: Not Currently    Birth control/protection: Condom  Other Topics Concern   Not on file  Social History Narrative   Not on file   Social Determinants of Health   Financial Resource Strain: Not on file  Food Insecurity: Not on file  Transportation Needs: Not on file  Physical Activity: Not on file  Stress: Not on file  Social Connections: Not on file  Additional Social History:    Sleep: Fair  Appetite:  Fair  Current Medications: Current Facility-Administered Medications  Medication Dose Route Frequency Provider Last Rate Last Admin   acetaminophen (TYLENOL) tablet 650 mg  650 mg Oral Q6H PRN Starkes-Perry, Gayland Curry, FNP       alum & mag hydroxide-simeth (MAALOX/MYLANTA) 200-200-20 MG/5ML suspension 30 mL  30 mL Oral Q4H PRN Starkes-Perry, Gayland Curry, FNP       amantadine (SYMMETREL) capsule 100 mg   100 mg Oral BID Merrily Brittle, DO   100 mg at 01/12/21 [id number]   benztropine (COGENTIN) tablet 0.5 mg  0.5 mg Oral Daily Merrily Brittle, DO   0.5 mg at 01/12/21 [id number]   dapagliflozin propanediol (FARXIGA) tablet 10 mg  10 mg Oral Daily Suella Broad, FNP   10 mg at 01/12/21 0809   divalproex (DEPAKOTE) DR tablet 1,000 mg  1,000 mg Oral BID Suella Broad, FNP   1,000 mg at 01/12/21 [id number]   lacosamide (VIMPAT) tablet 150 mg  150 mg Oral BID Merrily Brittle, DO   150 mg at 01/12/21 0810   levETIRAcetam (KEPPRA) tablet 500 mg  500 mg Oral BID Suella Broad, FNP   500 mg at 01/12/21 [id number]   OLANZapine zydis (ZYPREXA) disintegrating tablet 5 mg  5 mg Oral Q8H PRN Massengill, Ovid Curd, MD       And   LORazepam (ATIVAN) tablet 1 mg  1 mg Oral PRN Massengill, Ovid Curd, MD       And   ziprasidone (GEODON) injection 20 mg  20 mg Intramuscular PRN Massengill, Ovid Curd, MD       magnesium hydroxide (MILK OF MAGNESIA) suspension 30 mL  30 mL Oral Daily PRN Starkes-Perry, Gayland Curry, FNP       nicotine (NICODERM CQ - dosed in mg/24 hours) patch 14 mg  14 mg Transdermal Daily Suella Broad, FNP   14 mg at 01/12/21 0807   OLANZapine zydis (ZYPREXA) disintegrating tablet 5 mg  5 mg Oral BID Merrily Brittle, DO   5 mg at 01/12/21 [id number]   rosuvastatin (CRESTOR) tablet 20 mg  20 mg Oral QPM Merrily Brittle, DO        Lab Results:  Results for orders placed or performed during the hospital encounter of 01/11/21 (from the past 48 hour(s))  TSH     Status: None   Collection Time: 01/11/21  6:29 AM  Result Value Ref Range   TSH 3.155 0.350 - 4.500 uIU/mL    Comment: Performed by a 3rd Generation assay with a functional sensitivity of <=0.01 uIU/mL. Performed at [facility], [place] [address] [place], [place] [address]   Lipid panel     Status: None   Collection Time: 01/11/21  6:29 AM  Result Value Ref Range   Cholesterol 152 0 - 200 mg/dL   Triglycerides 130 <150 mg/dL   HDL 50  >40 mg/dL   Total CHOL/HDL Ratio 3.0 RATIO   VLDL 26 0 - 40 mg/dL   LDL Cholesterol 76 0 - 99 mg/dL    Comment:        Total Cholesterol/HDL:CHD Risk Coronary Heart Disease Risk Table                     Men   Women  1/2 Average Risk   3.4   3.3  Average Risk       5.0   4.4  2 X Average Risk   9.6  7.1  3 X Average Risk  23.4   11.0        Use the calculated Patient Ratio above and the CHD Risk Table to determine the patient's CHD Risk.        ATP III CLASSIFICATION (LDL):  <100     mg/dL   Optimal  100-129  mg/dL   Near or Above                    Optimal  130-159  mg/dL   Borderline  160-189  mg/dL   High  >190     mg/dL   Very High Performed at [facility] [address], [place], [place] [address]   Hemoglobin A1c     Status: Abnormal   Collection Time: 01/11/21  6:29 AM  Result Value Ref Range   Hgb A1c MFr Bld 5.8 (H) 4.8 - 5.6 %    Comment: (NOTE) Pre diabetes:          5.7%-6.4%  Diabetes:              >6.4%  Glycemic control for   <7.0% adults with diabetes    Mean Plasma Glucose 119.76 mg/dL    Comment: Performed at [facility] [address], [place], [place] [address]  Urinalysis, Complete w Microscopic Urine, Clean Catch     Status: Abnormal   Collection Time: 01/12/21  7:14 AM  Result Value Ref Range   Color, Urine COLORLESS (A) YELLOW   APPearance CLEAR CLEAR   Specific Gravity, Urine 1.006 1.005 - 1.030   pH 5.0 5.0 - 8.0   Glucose, UA >=500 (A) NEGATIVE mg/dL   Hgb urine dipstick NEGATIVE NEGATIVE   Bilirubin Urine NEGATIVE NEGATIVE   Ketones, ur NEGATIVE NEGATIVE mg/dL   Protein, ur NEGATIVE NEGATIVE mg/dL   Nitrite NEGATIVE NEGATIVE   Leukocytes,Ua NEGATIVE NEGATIVE   Bacteria, UA NONE SEEN NONE SEEN   Mucus PRESENT     Comment: Performed at [facility] [address] [place],  [address]    Blood Alcohol level:  Lab Results  Component Value Date   ETH <10 01/08/2021   ETH <10  [id number]    Metabolic Disorder Labs: Lab Results  Component Value Date   HGBA1C 5.8 (H) 01/11/2021   MPG 119.76 01/11/2021   MPG 131.24 08/16/2020   No results found for: PROLACTIN Lab Results  Component Value Date   CHOL 152 01/11/2021   TRIG 130 01/11/2021   HDL 50 01/11/2021   CHOLHDL 3.0 01/11/2021   VLDL 26 01/11/2021   LDLCALC 76 01/11/2021    Physical Findings:   Musculoskeletal: Strength & Muscle Tone: within normal limits Gait & Station: untested - sitting in bed Patient leans: N/A  Psychiatric Specialty Exam:  Presentation  General Appearance: Disheveled  Eye Contact:Fleeting  Speech: mumbling quality, normal rate  Speech Volume:soft  Mood and Affect  Mood: "good" - appears aloof  Affect: constricted, guarded  Thought Process  Thought Processes:concrete, vague, ruminative  Orientation: disoriented to time and place and situation  Thought Content: Reports delusion of receiving electrical energy into his hands from unknown source;  He denies thought broadcasting and thought insertion/withdrawal and denies ideas of reference. He denies suicidal thoughts and homicidal thoughts. He reports VH but is vague when questioned about AH; he reports some residual paranoia and appears paranoid on exam  Hallucinations: VH of people without pants; vague when questioned about AH  Ideas of  Reference: none  Suicidal Thoughts: none Homicidal Thoughts: none  Sensorium  Memory:Immediate Poor; Recent Poor; Remote Poor  Judgment:Poor  Insight:Lacking   Executive Functions  Concentration:Poor  Attention Span:Poor  Recall:Poor  Fund of Knowledge:Poor  Language:Fair   Psychomotor Activity  Psychomotor Activity: resting tremor in bilateral UE worse on right and at times switching sides during exam; no cogwheeling, no stiffness, no truncal or oral/facial movements  Assets  Assets:Communication Skills; Desire for Improvement; Resilience; Social  Support   Sleep  7.5 hours   Physical Exam Neurological:     Comments: Patient with high amplitude, low frequency resting tremor of the R and L arm (R>>L), partially extinguished with movement. No rigidity or bradykinesia appreciated. No foot tapping noted as previously documented. No orofacial or truncal movements.    Review of Systems  Cardiovascular:  Negative for chest pain.  Gastrointestinal:  Negative for diarrhea, nausea and vomiting.  Blood pressure 105/64, pulse 95, temperature 98 F (36.7 C), temperature source Oral, resp. rate 18, height 6\' 2"  (1.88 m), weight 91.6 kg, SpO2 100 %. Body mass index is 25.94 kg/m.   Treatment Plan Summary: Daily contact with patient to assess and evaluate symptoms and progress in treatment and Medication management  Daniel Foley is a 51 y.o. male with PMHx of schizophrenia and seizures (on Keppra, Depakote, lacosamide) living at a boarding home who presented involuntarily via [employer] after attempting to walk into heavy traffic and was found to be psychotic and responding internally, now admitted for management of psychosis. It is highly unlikely the patient was taking his medication. Once stabilized we will consider a long acting injectable.   Plan:  Schizophrenia by hx Prior to admission patient was on Haldol 5 mg daily, uncertain of med compliance and reason for acute decompensation. Considering medical nonadherence, given subtherapeutic VPA levels. Patient appeared to be baseline a few months prior when he was admitted to the ED for seizures.  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 Patient exhibits a prominent resting tremor, most consistent with drug induced parkinsonism. On 1/7 he did not exhibit any truncal, orofacial, or foot tapping movements. This may be due to improvement with addition of amantadine.  Decrease Cogentin 1 mg to 0.5 mg daily x2 days, then discontinue Continue amantadine 100 mg (started 1/6) and holding Austedo at this time Monitor with AIMs Antipsychotic changes as above     Other medical conditions: Seizure disorder: Continue home Depakote DR 1000 mg BID, re-start lacosamide 150 mg BID, continue Keppra 500 mg BID HLD: continue home rosuvastatin 20 mg qHS T2DM: Continue home Farxiga 10 mg daily, did not restart home semaglutide; checking FSBS ACHS with SSI ordered H/o CKD?:  Held home finerenone H/o COPD: Held home Symbicort, Spiriva Respimat   Corky Sox, MD PGY-1

## 2021-01-12 NOTE — Progress Notes (Signed)
Pt was encouraged but didn't attend orientation/goals group. ?

## 2021-01-12 NOTE — Progress Notes (Signed)
°   01/12/21 1100  Psych Admission Type (Psych Patients Only)  Admission Status Involuntary  Psychosocial Assessment  Patient Complaints Suspiciousness  Eye Contact Fair  Facial Expression Anxious;Trembling lip;Worried  Affect Anxious  Speech Soft;Slow  Interaction Assertive  Motor Activity Fidgety;Tremors  Appearance/Hygiene Disheveled;In scrubs  Behavior Characteristics Cooperative  Mood Suspicious  Aggressive Behavior  Effect No apparent injury  Thought Process  Coherency Circumstantial  Content Blaming others  Delusions WDL  Perception Hallucinations  Hallucination Visual;Auditory  Judgment Impaired  Confusion WDL  Danger to Self  Current suicidal ideation? Denies  Danger to Others  Danger to Others None reported or observed

## 2021-01-12 NOTE — Group Note (Signed)
°  [facility] LCSW Group Therapy Note  Date/Time:  01/12/2021 11:15AM-12:00PM  Type of Therapy and Topic:  Group Therapy:  Feelings About Hospitalization  Participation Level:  Minimal   Description of Group This process group involved patients discussing their feelings related to being hospitalized, as well as the benefits they see to being in the hospital.  These feelings and benefits were itemized.  The group then brainstormed specific ways in which they could seek those same benefits when they discharge and return home.  Therapeutic Goals Patient will identify and describe positive and negative feelings related to hospitalization Patient will verbalize benefits of hospitalization to themselves personally Patients will brainstorm together ways they can obtain similar benefits in the outpatient setting, identify barriers to wellness and possible solutions  Summary of Patient Progress:  The patient expressed his primary feelings about being hospitalized are "good, I've been here before."  His responses to questions posed to the group were off topic.  He did say he likes to drink a lot of water, do pushups and eat well.  He was non-responsive when asked how he can ensure that he continues after discharge to eat healthy food on a regular basis.  Therapeutic Modalities Cognitive Behavioral Therapy Motivational Interviewing    Ambrose Mantle, LCSW 01/12/2021, 1:42 PM

## 2021-01-12 NOTE — Plan of Care (Signed)
  Problem: Education: Goal: Emotional status will improve Outcome: Not Progressing Goal: Mental status will improve Outcome: Not Progressing   Problem: Activity: Goal: Interest or engagement in activities will improve Outcome: Not Progressing   

## 2021-01-12 NOTE — [facility] Group Notes (Signed)
Goals Group 01/12/21    Group Focus: affirmation, clarity of thought, and goals/reality orientation Treatment Modality:  Psychoeducation Interventions utilized were assignment, group exercise, and support Purpose: To be able to understand and verbalize the reason for their admission to the hospital. To understand that the medication helps with their chemical imbalance but they also need to work on their choices in life. To be challenged to develop a list of 30 positives about themselves. Also introduce the concept that "feelings" are not reality.  Participation Level:  Pt came into the room and stayed for maybe 5 mins. He then left to go to the bathroom and did not return Additional Comments:  ...  Dione Housekeeper

## 2021-01-12 NOTE — [facility] Group Notes (Signed)
° ° °  Spirituality group facilitated by Kathrynn Humble, [place].   Group Description: Group focused on topic of hope. Patients participated in facilitated discussion around topic, connecting with one another around experiences and definitions for hope. Group members engaged with visual explorer photos, reflecting on what hope looks like for them today. Group engaged in discussion around how their definitions of hope are present today in hospital.   Modalities: Psycho-social ed, Adlerian, Narrative, MI   Patient Progress:  Daniel Foley attended group.  He was in good spirits and made comments in the conversation while making eye contact, but the comments were not on topic and were somewhat mumbled.    [place], B[facility] Pager, [phone] 7:51 PM

## 2021-01-12 NOTE — Progress Notes (Signed)
°   01/12/21 0545  Sleep  Number of Hours 7.5

## 2021-01-12 NOTE — Progress Notes (Signed)
°  On assessment, pt is resting in bed.  Pt is alert and oriented.  Pt denies anxiety and depression, but visibly appears to be.  Pt denies SI/HI and verbally contracts for safety.  Pt endorses AVH stating, "I see my mom, step-dad and sister saying things like you're guilty for having me."  When asked, Having who?  Pt replied, "having babies."  Medication administered.  CBG taken = 170 Blood Glucose.  Provider on call made aware.  Pt is safe on the unit with Q 15 minute safety checks.    01/12/21 2055  Psych Admission Type (Psych Patients Only)  Admission Status Involuntary  Psychosocial Assessment  Patient Complaints Anxiety;Suspiciousness  Eye Contact Fair  Facial Expression Anxious;Worried  Affect Anxious  Speech Soft;Slow  Interaction Assertive  Motor Activity Fidgety;Tremors  Appearance/Hygiene Disheveled;In scrubs  Behavior Characteristics Cooperative  Mood Anxious;Suspicious  Thought Process  Coherency Circumstantial  Content Blaming others  Delusions WDL  Perception Hallucinations  Hallucination Visual;Auditory (Pt states, "I see my mom, step-dad and sister."  Pt states they say things like, "you're guilty for having me."  When asked, Having who?  Pt replied, "having babies.")  Judgment Impaired  Confusion WDL  Danger to Self  Current suicidal ideation? Denies  Danger to Others  Danger to Others None reported or observed

## 2021-01-12 NOTE — Progress Notes (Signed)
Patient did not attend wrap up group. 

## 2021-01-13 DIAGNOSIS — F209 Schizophrenia, unspecified: Secondary | ICD-10-CM | POA: Diagnosis not present

## 2021-01-13 LAB — GLUCOSE, CAPILLARY
Glucose-Capillary: 100 mg/dL — ABNORMAL HIGH (ref 70–99)
Glucose-Capillary: 110 mg/dL — ABNORMAL HIGH (ref 70–99)
Glucose-Capillary: 127 mg/dL — ABNORMAL HIGH (ref 70–99)
Glucose-Capillary: 158 mg/dL — ABNORMAL HIGH (ref 70–99)

## 2021-01-13 MED ORDER — OLANZAPINE 5 MG PO TBDP
5.0000 mg | ORAL_TABLET | Freq: Every day | ORAL | Status: DC
  Administered 2021-01-14 – 2021-01-16 (×3): 5 mg via ORAL
  Filled 2021-01-13 (×6): qty 1

## 2021-01-13 MED ORDER — OLANZAPINE 10 MG PO TBDP
10.0000 mg | ORAL_TABLET | Freq: Every day | ORAL | Status: DC
  Administered 2021-01-13 – 2021-01-14 (×2): 10 mg via ORAL
  Filled 2021-01-13 (×5): qty 1

## 2021-01-13 NOTE — Progress Notes (Addendum)
[facility] MD Progress Note  01/13/2021 5:02 PM Jered Viloria  MRN:  [id number]  Principal Problem: Schizophrenia ([place]) Diagnosis: Principal Problem:   Schizophrenia ([place])  Subjective:   Ephraim Laursen is a 51 y.o. male with PMHx of schizophrenia, seizures (on Keppra, Depakote, lacosamide) living at a boarding home who presented involuntarily via [employer] after attempting to walk into heavy traffic and was found to be psychotic and responding internally, now admitted for management of psychosis.  The patient's chart was reviewed and nursing notes were reviewed. Over the past 24 hrs, there were no documented behavioral issues, no PRN medications given for agitation.The patient's case was discussed in multidisciplinary team meeting.   Patient was seen today with attending Dr. Nelda Marseille. Berneta Sages was initially seen laying in bed resting, he was pleasant with evaluation.  Stated that his mood, appetite, and sleep are good.  Stated that he does not want to return to the boardinghouse that he came from due to belief that someone at the boardinghouse is out to kill him.  In response to who he believes might be after him, he states a "person with more money".   He reported that he saw VH of his parents this morning when he went to the restroom. He denied AH but admitted to belief in thought insertion/withdrawal bu unknown persons. He denied belief in thought broadcasting.  In response to orientation questions, he states "I do not know, I am here because I cannot smoke cigarettes.  I don't know what year it is, I am still waiting on my birthday.  I don't  know what city I am in , I am from [place]."  Patient denied having electricity is in his hands, ideas of reference or magical thinking.  He denied SI/HI and denied medication side effects. He voiced no physical complaints. Patient was cooperative with interview, however when asked to set up to assess tremors and AIMS, patient refused.  Total Time Spent in Direct Patient  Care: I personally spent 30 minutes on the unit in direct patient care. The direct patient care time included face-to-face time with the patient, reviewing the patient's chart, communicating with other professionals, and coordinating care. Greater than 50% of this time was spent in counseling or coordinating care with the patient regarding goals of hospitalization, psycho-education, and discharge planning needs.   Past Psychiatric History: unable to find due to poor patient recall, lack of previous documentation, and inability to reach collateral  Past Medical History:  Past Medical History:  Diagnosis Date   Chronic kidney disease    Diabetes mellitus without complication ([place])    Neuromuscular disorder ([place])    Schizophrenia ([place])    History reviewed. No pertinent surgical history. Family History:  Family History  Problem Relation Age of Onset   Seizures Brother    Family Psychiatric  History:  see H&P  Social History:  Social History   Substance and Sexual Activity  Alcohol Use Yes     Social History   Substance and Sexual Activity  Drug Use Not Currently    Social History   Socioeconomic History   Marital status: Unknown    Spouse name: Not on file   Number of children: Not on file   Years of education: Not on file   Highest education level: Not on file  Occupational History   Not on file  Tobacco Use   Smoking status: Every Day    Packs/day: 0.25    Years: 0.50    Pack years: 0.13  Types: Cigarettes   Smokeless tobacco: Never  Vaping Use   Vaping Use: Never used  Substance and Sexual Activity   Alcohol use: Yes   Drug use: Not Currently   Sexual activity: Not Currently    Birth control/protection: Condom  Other Topics Concern   Not on file  Social History Narrative   Not on file   Social Determinants of Health   Financial Resource Strain: Not on file  Food Insecurity: Not on file  Transportation Needs: Not on file  Physical Activity: Not on  file  Stress: Not on file  Social Connections: Not on file   Additional Social History:    Sleep: Good  Appetite:  Good  Current Medications: Current Facility-Administered Medications  Medication Dose Route Frequency Provider Last Rate Last Admin   acetaminophen (TYLENOL) tablet 650 mg  650 mg Oral Q6H PRN Starkes-Perry, Gayland Curry, FNP       alum & mag hydroxide-simeth (MAALOX/MYLANTA) 200-200-20 MG/5ML suspension 30 mL  30 mL Oral Q4H PRN Starkes-Perry, Gayland Curry, FNP       amantadine (SYMMETREL) capsule 100 mg  100 mg Oral BID Merrily Brittle, DO   100 mg at 01/13/21 1648   dapagliflozin propanediol (FARXIGA) tablet 10 mg  10 mg Oral Daily Suella Broad, FNP   10 mg at 01/13/21 [id number]   divalproex (DEPAKOTE) DR tablet 1,000 mg  1,000 mg Oral BID Suella Broad, FNP   1,000 mg at 01/13/21 1648   insulin aspart (novoLOG) injection 0-9 Units  0-9 Units Subcutaneous TID WC Nelda Marseille, Lelynd Poer E, MD       lacosamide (VIMPAT) tablet 150 mg  150 mg Oral BID Merrily Brittle, DO   150 mg at 01/13/21 1649   levETIRAcetam (KEPPRA) tablet 500 mg  500 mg Oral BID Suella Broad, FNP   500 mg at 01/13/21 1647   OLANZapine zydis (ZYPREXA) disintegrating tablet 5 mg  5 mg Oral Q8H PRN Massengill, Ovid Curd, MD       And   LORazepam (ATIVAN) tablet 1 mg  1 mg Oral PRN Massengill, Ovid Curd, MD       And   ziprasidone (GEODON) injection 20 mg  20 mg Intramuscular PRN Massengill, Ovid Curd, MD       magnesium hydroxide (MILK OF MAGNESIA) suspension 30 mL  30 mL Oral Daily PRN Starkes-Perry, Gayland Curry, FNP       nicotine (NICODERM CQ - dosed in mg/24 hours) patch 14 mg  14 mg Transdermal Daily Suella Broad, FNP   14 mg at 01/13/21 [id number]   OLANZapine zydis (ZYPREXA) disintegrating tablet 5 mg  5 mg Oral BID Merrily Brittle, DO   5 mg at 01/13/21 [id number]   rosuvastatin (CRESTOR) tablet 20 mg  20 mg Oral QPM Merrily Brittle, DO   20 mg at 01/12/21 1710    Lab Results:  Results for orders placed or  performed during the hospital encounter of 01/11/21 (from the past 48 hour(s))  Urinalysis, Complete w Microscopic Urine, Clean Catch     Status: Abnormal   Collection Time: 01/12/21  7:14 AM  Result Value Ref Range   Color, Urine COLORLESS (A) YELLOW   APPearance CLEAR CLEAR   Specific Gravity, Urine 1.006 1.005 - 1.030   pH 5.0 5.0 - 8.0   Glucose, UA >=500 (A) NEGATIVE mg/dL   Hgb urine dipstick NEGATIVE NEGATIVE   Bilirubin Urine NEGATIVE NEGATIVE   Ketones, ur NEGATIVE NEGATIVE mg/dL   Protein, ur NEGATIVE NEGATIVE mg/dL  Nitrite NEGATIVE NEGATIVE   Leukocytes,Ua NEGATIVE NEGATIVE   Bacteria, UA NONE SEEN NONE SEEN   Mucus PRESENT     Comment: Performed at [facility], [place] [address] [place], [place] [address]  Glucose, capillary     Status: Abnormal   Collection Time: 01/12/21  8:02 PM  Result Value Ref Range   Glucose-Capillary 170 (H) 70 - 99 mg/dL    Comment: Glucose reference range applies only to samples taken after fasting for at least 8 hours.  Glucose, capillary     Status: Abnormal   Collection Time: 01/13/21  6:23 AM  Result Value Ref Range   Glucose-Capillary 100 (H) 70 - 99 mg/dL    Comment: Glucose reference range applies only to samples taken after fasting for at least 8 hours.  Glucose, capillary     Status: Abnormal   Collection Time: 01/13/21 12:03 PM  Result Value Ref Range   Glucose-Capillary 110 (H) 70 - 99 mg/dL    Comment: Glucose reference range applies only to samples taken after fasting for at least 8 hours.    Blood Alcohol level:  Lab Results  Component Value Date   ETH <10 01/08/2021   ETH <10 [id number]    Metabolic Disorder Labs: Lab Results  Component Value Date   HGBA1C 5.8 (H) 01/11/2021   MPG 119.76 01/11/2021   MPG 131.24 08/16/2020   No results found for: PROLACTIN Lab Results  Component Value Date   CHOL 152 01/11/2021   TRIG 130 01/11/2021   HDL 50 01/11/2021   CHOLHDL 3.0 01/11/2021   VLDL 26  01/11/2021   LDLCALC 76 01/11/2021    Physical Findings:   Musculoskeletal: Strength & Muscle Tone: within normal limits Gait & Station: Steady Patient leans: N/A  AIMS deferred, as patient refused to participate  Psychiatric Specialty Exam: Presentation  General Appearance: Disheveled  Eye Contact:Fair  Speech: mumbling quality, normal rate  Speech Volume: soft  Mood and Affect  Mood: described as "good" -appears aloof  Affect: constricted, guarded  Thought Process  Thought Processes: concrete, vague, ruminative  Orientation: disoriented to time and place and situation  Thought Content: Denies SI, HI, ideas of reference, or thought broadcasting.  He denies auditory hallucinations but reports visual hallucination of seeing his parents earlier today.  He continues to have paranoid thoughts and persecutory delusions that people at his boardinghouse are out to kill him.  He endorses belief in thought insertion and withdrawal.  He is not grossly responding to any internal or external stimuli on exam.  Hallucinations: VH parents  Ideas of Reference: none  Suicidal Thoughts: none  Homicidal Thoughts: none  Sensorium  Memory:Immediate Poor; Recent Poor; Remote Poor  Judgment:Poor  Insight:Lacking   Executive Functions  Concentration:Poor  Attention Span:Poor  Recall:Poor  Fund of Knowledge:Poor  Language:Fair   Psychomotor Activity  Psychomotor Activity: Not able to assess due to patient refusing to participate in examination.  Noted resting tremors in left foot on exam.  Assets  Assets:Communication Skills; Desire for Improvement; Resilience; Social Support   Sleep  No sleep hours documented in chart   Physical Exam Vitals and nursing note reviewed.  Constitutional:      General: He is awake. He is not in acute distress.    Appearance: He is not ill-appearing or diaphoretic.  HENT:     Head: Normocephalic.  Pulmonary:     Effort: Pulmonary  effort is normal.  Neurological:     General: No focal deficit present.  Mental Status: He is alert. He is disoriented.     Motor: Tremor present.     Gait: Gait is intact.   Review of Systems  Respiratory:  Negative for shortness of breath.   Cardiovascular:  Negative for chest pain.  Gastrointestinal:  Negative for constipation, diarrhea, nausea and vomiting.  Blood pressure 119/73, pulse 76, temperature 97.9 F (36.6 C), temperature source Oral, resp. rate 16, height 6\' 2"  (1.88 m), weight 91.6 kg, SpO2 99 %. Body mass index is 25.94 kg/m.  Treatment Plan Summary: Daily contact with patient to assess and evaluate symptoms and progress in treatment and Medication management  Christhian Prey is a 51 y.o. male with PMHx of schizophrenia and seizures (on Keppra, Depakote, lacosamide) living at a boarding home who presented involuntarily via [employer] after attempting to walk into heavy traffic and was found to be psychotic and responding internally, now admitted for management of psychosis. It is highly unlikely the patient was taking his medication. Once stabilized we will consider a long acting injectable.   Plan:  Schizophrenia by history Patient continues to exhibit AVH, disorganized speech and thought.  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 Patient exhibits a prominent resting tremor, most consistent with drug induced parkinsonism.  Unable to assess movement, as patient refused to participate in exam. Discontinued Cogentin 0.5 mg daily Continued amantadine 100 mg (started 1/6) and holding Austedo at this time Monitor with AIMS  as he will cooperate   Other medical conditions: Seizure disorder: Continued home Depakote DR 1000 mg BID, lacosamide 150 mg BID, Keppra 500 mg BID HLD: Continued home rosuvastatin 20 mg qHS T2DM: Continued home Farxiga 10 mg daily, did not restart home semaglutide; FSBS AC/HS with SSI ordered H/o CKD?:  Held home finerenone H/o COPD: Held home Symbicort, Spiriva Respimat  Signed: Merrily Brittle, DO Psychiatry Resident, PGY-1 [facility] 01/13/2021, 5:26 PM

## 2021-01-13 NOTE — Progress Notes (Signed)
D:  Daniel Foley was in the day room briefly this evening.  He did attend evening wrap up group and obtained snack.  He denied SI/HI but continues to report AH of his father and mother.  He was tangential and difficult to follow at times but pleasant.  He denied any pain or discomfort and appeared to be in no physical distress.  His hs blood sugar was 158.  He took his bedtime medications without difficulty and no prns requested.   A:  1:1 with RN for support and encouragement.  Medications given as ordered.  Q 15 minute checks maintained for safety.  Encouraged participation in group and unit activities.   R:  He is currently resting with his eyes closed and appears to be asleep.  He remains safe on the unit.  We will continue to monitor the progress towards his goals.

## 2021-01-13 NOTE — Progress Notes (Signed)
Pt was encouraged but didn't attend orientation/goals group. ?

## 2021-01-13 NOTE — Group Note (Signed)
°  BHH/BM[facility] LCSW Group Therapy Note  Date/Time:  01/13/2021 11:15AM-12:00PM  Type of Therapy and Topic:  Group Therapy:  Self-Care after Hospitalization  Participation Level:  Active   Description of Group This process group involved patients discussing how they plan to take care of themselves in a better manner when they get home from the hospital.  The group started with patients listing one healthy and one unhealthy way they took care of themselves prior to hospitalization.  A discussion ensued about the differences in healthy and unhealthy coping skills.  Group members shared ideas about making changes when they return home so that they can stay well and in recovery.  The white board was used to list ideas so that patients can continue to see these ideas throughout the day.  Therapeutic Goals Patient will identify and describe one healthy and one unhealthy coping technique used prior to hospitalization Patient will participate in generating ideas about healthy self-care options when they return to the community Patients will be supportive of one another and receive said support from others Patient will identify one healthy self-care activity to add to his/her post-hospitalization life that can help in recovery  Summary of Patient Progress:  The patient expressed that prior to hospitalization some healthy self-care activity he engaged in was eating salads, while expressing no unhealthy self-care activity.  Patient's participation in group was helpful.   Patient identified listening to doctors as another self-care activity to add in his pursuit of recovery post-discharge.   Therapeutic Modalities Brief Solution-Focused Therapy Motivational Interviewing Psychoeducation      Read Drivers, Latanya Presser 01/13/2021  2:25 PM

## 2021-01-13 NOTE — Progress Notes (Addendum)
Inpatient Diabetes Program Recommendations  AACE/ADA: New Consensus Statement on Inpatient Glycemic Control (2015)  Target Ranges:  Prepandial:   less than 140 mg/dL      Peak postprandial:   less than 180 mg/dL (1-2 hours)      Critically ill patients:  140 - 180 mg/dL   Lab Results  Component Value Date   GLUCAP 100 (H) 01/13/2021   HGBA1C 5.8 (H) 01/11/2021    Review of Glycemic Control  Latest Reference Range & Units 01/12/21 20:02 01/13/21 06:23  Glucose-Capillary 70 - 99 mg/dL 194 (H) 174 (H)   Diabetes history: DM 2 Outpatient Diabetes medications: Farxiga 10 mg Daily, Rybelsus 7 mg Daily Current orders for Inpatient glycemic control:  Farxiga 10 mg Daily Novolog 0-9 units tid  A1c 5.8% on 1/6 Consult glucose management  Inpatient Diabetes Program Recommendations:    Monitor on current regimen. Glucose trends within in patient goal 140-180.  Thanks,  Christena Deem RN, MSN, BC-ADM Inpatient Diabetes Coordinator Team Pager [phone] (8a-5p)

## 2021-01-13 NOTE — Hospital Course (Addendum)
Daniel Foley is a 51 y.o. male with PPHx of schizophrenia, seizures (on Keppra, Depakote, lacosamide) living at a boarding home who presented involuntarily via [employer] after attempting to walk into heavy traffic and was found to be psychotic and responding internally, now admitted for management of acute psychosis exacerbated by recent cocaine use.  Psychiatric diagnoses provided upon initial assessment: Principal Problem:   Schizophrenia ([place]) Active Problems:   Drug-induced parkinsonism ([place])   Delirium superimposed on dementia   Tobacco abuse   Controlled type 2 diabetes mellitus with hypoglycemia ([facility])   Patient's psychiatric medications were adjusted on admission:  Discontinued home Haldol 5 mg daily - due to EPS  Discontinued home Austedo 6 mg nightly - no tardive dyskinesia  Decreased Cogentin 1 mg to 0.5 mg daily x2 days, then discontinuing Started amantadine 100 mg daily Started Zyprexa 5 mg twice daily - for psychosis  Seizure disorder: Continued home Depakote DR 1000 mg BID, re-start lacosamide 150 mg BID, continue Keppra 500 mg BID  During the hospitalization, other adjustments were made to the patient's psychiatric medication regimen:  Discontinued Cogentin - due to delirium and inefficiency for EPS Decreased home Depakote DR 1000 mg BID for seizures to 750 mg BID due to elevated transaminases Changed amantadine 100 mg daily to every other day Started Abilify 5 mg qHS - psychosis Started donepezil 5 mg qHS for memory loss  Events during hospitalizations: Sundowning in the evening  FOLLOW-UP Considerations: Labs: CBC, CMP, Lipid panel, A1c, EKG  Other medical conditions: Seizure disorder: Continued home Depakote DR 1000 mg BID, re-start lacosamide 150 mg BID, continue Keppra 500 mg BID HLD: Continued home rosuvastatin 20 mg qHS T2DM: Continued home Farxiga 10 mg daily, did not restart home semaglutide H/o CKD?:  Held home finerenone H/o COPD: Held home Symbicort, Spiriva  Respimat

## 2021-01-13 NOTE — B[facility] Group Notes (Signed)
Adult Psychoeducational Group Not Date:  01/13/2021 Time:  0900-1045 Group Topic/Focus: PROGRESSIVE RELAXATION. A group where deep breathing is taught and tensing and relaxation muscle groups is used. Imagery is used as well.  Pts are asked to imagine 3 pillars that hold them up when they are not able to hold themselves up.  Participation Level:  Active  Participation Quality:  Appropriate  Affect:  Appropriate  Cognitive:  Oriented  Insight: Improving  Engagement in Group:  Engaged  Modes of Intervention:  Activity, Discussion, Education, and Support  Additional Comments:  rates his energy at a 10/10. States his pillars are: water and his boarding home.  Paulino Rily

## 2021-01-13 NOTE — Progress Notes (Signed)
Patient denies SI, HI and AVH this shift. Patient attended groups was compliant with medications.  Patient had no incidents of behavioral dyscontrol this shift.  °Monitor patient for safety, offer medications as prescribed, engage patient 1:1 staff talks.  °Patient able to contract for safety. Continue to monitor as planned.  °

## 2021-01-14 LAB — GLUCOSE, CAPILLARY
Glucose-Capillary: 121 mg/dL — ABNORMAL HIGH (ref 70–99)
Glucose-Capillary: 168 mg/dL — ABNORMAL HIGH (ref 70–99)
Glucose-Capillary: 262 mg/dL — ABNORMAL HIGH (ref 70–99)

## 2021-01-14 NOTE — Progress Notes (Signed)
Pt was encouraged but didn't attend orientation/goals group. ?

## 2021-01-14 NOTE — [facility] Group Notes (Signed)
Adult Psychoeducational Group Note  Date:  01/14/2021 Time:  12:17 AM  Group Topic/Focus:  Conflict Resolution:   The focus of this group is to discuss the conflict resolution process and how it may be used upon discharge.  Participation Level:  Minimal  Participation Quality:  Appropriate  Affect:  Appropriate  Cognitive:  Alert  Insight: Appropriate  Engagement in Group:  Engaged  Modes of Intervention:  Discussion  Additional Comments  Dalene Carrow 01/14/2021, 12:17 AM

## 2021-01-14 NOTE — Group Note (Addendum)
Recreation Therapy Group Note   Group Topic:Healthy Decision Making  Group Date: 01/14/2021 Start Time: 1006 End Time: 1045 Facilitators: Caroll Rancher, LRT,CTRS Location: [address] Dayroom   Goal Area(s) Addresses:  Patient will effectively work with peer towards shared goal.  Patient will identify factors that guided their decision making.  Patient will pro-socially communicate ideas during group session.   Group Description: Patients were given a scenario that they were going to be stranded on a deserted [place] for several months before being rescued. Writer tasked them with making a list of 15 things they would choose to bring with them for "survival". The list of items was prioritized most important to least. Each patient would come up with their own list, then work together to create a new list of 15 items while in a group of 3-5 peers. LRT discussed each person's list and how it differed from others. The debrief included discussion of priorities, good decisions versus bad decisions, and how it is important to think before acting so we can make the best decision possible. LRT tied the concept of effective communication among group members to patient's support systems outside of the hospital and its benefit post discharge.   Affect/Mood: Appropriate   Participation Level: Moderate   Participation Quality: Moderate Cues   Behavior: Appropriate   Speech/Thought Process: Focused   Insight: Limited   Judgement: Limited   Modes of Intervention: Group work   Patient Response to Interventions:  Attentive   Education Outcome:  Acknowledges education and In group clarification offered    Clinical Observations/Individualized Feedback: Pt identified one form of decisions people make as positive.  Pt also expressed communication was something needed to make good decisions. Pt was appropriate during group.  Pt needed assistance from LRT to write out his list.  Pt was able to come up  with some useful items for the scenario.  Pt identified some useful items as a tent, grill, pot, medicine, food, radio and hammer/nails.  Pt needed some prompting when it came for the group to compile one list together.  Pt was attentive but seemed a little reserved.      Plan: Continue to engage patient in RT group sessions 2-3x/week.   Caroll Rancher, LRT,CTRS  01/14/2021 1:12 PM

## 2021-01-14 NOTE — Progress Notes (Signed)
Pt visible some on the unit this evening    01/14/21 2100  Psych Admission Type (Psych Patients Only)  Admission Status Involuntary  Psychosocial Assessment  Patient Complaints Suspiciousness  Eye Contact Fair  Facial Expression Anxious;Worried  Affect Anxious  Speech Soft;Slow  Interaction Assertive  Motor Activity Tremors  Appearance/Hygiene Disheveled  Behavior Characteristics Cooperative  Mood Anxious  Thought Process  Coherency Circumstantial  Content Blaming others  Delusions WDL  Perception Hallucinations  Hallucination Visual;Auditory  Judgment Impaired  Confusion WDL  Danger to Self  Current suicidal ideation? Denies  Danger to Others  Danger to Others None reported or observed

## 2021-01-14 NOTE — Progress Notes (Signed)
[facility] MD Progress Note  01/14/2021 5:11 PM Daniel Foley  MRN: [id number] CC: Psychosis  Subjective: Daniel Foley is a 51 y.o. male with PPHx of schizophrenia, seizures (on Keppra, Depakote, lacosamide) living at a boarding home who presented involuntarily via [employer] after attempting to walk into heavy traffic and was found to be psychotic and responding internally, now admitted for management of acute psychosis.  [place] Day 3   Yesterday's recommendation by the Psychiatry team Increase Zyprexa to 5mg  qam and 10mg  qhs for psychosis and d/c home Haldol (QTC 372ms, A1c 5.8, Lipid panel WNL) Consider LAI once stabilized Continue to attempt reaching caregiver to assess patient's baseline We will work with CSW to assess if patient qualifies for ACTT  Discontinued Cogentin 0.5 mg daily Continued amantadine 100 mg (started 1/6) and holding Austedo at this time  Interim History: Patient was pleasant and cooperative on evaluation today, although still disoriented and delusional. He was initially seen lying in bed resting. Stated that his sleep, mood, and appetite are fine. Patient had no other concerns. He continued to perseverate that he does not want to return to the boarding house that he came from for concern of "they are after me". In response to asking why and who are after you, he responded, "the owner of the house sat on my lap and the law is dangerous". In response to, if he feels safe here in the hospital, he stated, "no, the law ain't safe for no one". Patient is A&Ox 1, to self only. He stated that he is in a hospital, but did not know the city, state, year, or situation that brought him in. After re-orienting him, patient stated that he has a niece that lives in [place] and visited him 2 months ago. However he does not know what her number is, or how to contact her. He also revealed that he had been using cocaine prior to admission. Patient denied medication side effects. He continued to endorse having the  power of electricity in his hands that come and go, saying that he is able to shock people. Patient denied SI/HI/AVH, thought insertion and broadcasting, reading minds, ideas of reference, and contracted to safety on the unit. Patient was not grossly responding to internal/external stimuli.   Diagnosis:  Principal Problem:   Schizophrenia ([place])  Total Time spent with patient:  I personally spent 30 minutes on the unit in direct patient care. The direct patient care time included face-to-face time with the patient, reviewing the patient's chart, communicating with other professionals, and coordinating care. Greater than 50% of this time was spent in counseling or coordinating care with the patient regarding goals of hospitalization, psycho-education, and discharge planning needs.   Past Psychiatric History: See H&P  Past Medical History:  Past Medical History:  Diagnosis Date   Chronic kidney disease    Diabetes mellitus without complication ([place])    Neuromuscular disorder ([place])    Schizophrenia ([place])    History reviewed. No pertinent surgical history. Family History:  Family History  Problem Relation Age of Onset   Seizures Brother    Family Psychiatric  History: See H&P Social History:  Social History   Substance and Sexual Activity  Alcohol Use Yes     Social History   Substance and Sexual Activity  Drug Use Not Currently    Social History   Socioeconomic History   Marital status: Unknown    Spouse name: Not on file   Number of children: Not on file  Years of education: Not on file   Highest education level: Not on file  Occupational History   Not on file  Tobacco Use   Smoking status: Every Day    Packs/day: 0.25    Years: 0.50    Pack years: 0.13    Types: Cigarettes   Smokeless tobacco: Never  Vaping Use   Vaping Use: Never used  Substance and Sexual Activity   Alcohol use: Yes   Drug use: Not Currently   Sexual activity: Not Currently    Birth  control/protection: Condom  Other Topics Concern   Not on file  Social History Narrative   Not on file   Social Determinants of Health   Financial Resource Strain: Not on file  Food Insecurity: Not on file  Transportation Needs: Not on file  Physical Activity: Not on file  Stress: Not on file  Social Connections: Not on file   Additional Social History:                         Sleep: Per above  Appetite:  Per above  Current Medications: Current Facility-Administered Medications  Medication Dose Route Frequency Provider Last Rate Last Admin   acetaminophen (TYLENOL) tablet 650 mg  650 mg Oral Q6H PRN Starkes-Perry, Gayland Curry, FNP       alum & mag hydroxide-simeth (MAALOX/MYLANTA) 200-200-20 MG/5ML suspension 30 mL  30 mL Oral Q4H PRN Starkes-Perry, Gayland Curry, FNP       amantadine (SYMMETREL) capsule 100 mg  100 mg Oral BID Merrily Brittle, DO   100 mg at 01/14/21 0825   dapagliflozin propanediol (FARXIGA) tablet 10 mg  10 mg Oral Daily Suella Broad, FNP   10 mg at 01/14/21 1257   divalproex (DEPAKOTE) DR tablet 1,000 mg  1,000 mg Oral BID Suella Broad, FNP   1,000 mg at 01/14/21 0824   insulin aspart (novoLOG) injection 0-9 Units  0-9 Units Subcutaneous TID WC Harlow Asa, MD   2 Units at 01/14/21 1258   lacosamide (VIMPAT) tablet 150 mg  150 mg Oral BID Merrily Brittle, DO   150 mg at 01/14/21 [id number]   levETIRAcetam (KEPPRA) tablet 500 mg  500 mg Oral BID Suella Broad, FNP   500 mg at 01/14/21 0825   OLANZapine zydis (ZYPREXA) disintegrating tablet 5 mg  5 mg Oral Q8H PRN Massengill, Ovid Curd, MD       And   LORazepam (ATIVAN) tablet 1 mg  1 mg Oral PRN Massengill, Ovid Curd, MD       And   ziprasidone (GEODON) injection 20 mg  20 mg Intramuscular PRN Massengill, Ovid Curd, MD       magnesium hydroxide (MILK OF MAGNESIA) suspension 30 mL  30 mL Oral Daily PRN Starkes-Perry, Gayland Curry, FNP       nicotine (NICODERM CQ - dosed in mg/24 hours) patch 14 mg   14 mg Transdermal Daily Suella Broad, FNP   14 mg at 01/14/21 0825   OLANZapine zydis (ZYPREXA) disintegrating tablet 10 mg  10 mg Oral QHS Nelda Marseille, Amy E, MD   10 mg at 01/13/21 2121   OLANZapine zydis (ZYPREXA) disintegrating tablet 5 mg  5 mg Oral Daily Nelda Marseille, Amy E, MD   5 mg at 01/14/21 0825   rosuvastatin (CRESTOR) tablet 20 mg  20 mg Oral QPM Merrily Brittle, DO   20 mg at 01/12/21 1710    Lab Results:  Results for orders placed or performed  during the hospital encounter of 01/11/21 (from the past 48 hour(s))  Glucose, capillary     Status: Abnormal   Collection Time: 01/12/21  8:02 PM  Result Value Ref Range   Glucose-Capillary 170 (H) 70 - 99 mg/dL    Comment: Glucose reference range applies only to samples taken after fasting for at least 8 hours.  Glucose, capillary     Status: Abnormal   Collection Time: 01/13/21  6:23 AM  Result Value Ref Range   Glucose-Capillary 100 (H) 70 - 99 mg/dL    Comment: Glucose reference range applies only to samples taken after fasting for at least 8 hours.  Glucose, capillary     Status: Abnormal   Collection Time: 01/13/21 12:03 PM  Result Value Ref Range   Glucose-Capillary 110 (H) 70 - 99 mg/dL    Comment: Glucose reference range applies only to samples taken after fasting for at least 8 hours.  Glucose, capillary     Status: Abnormal   Collection Time: 01/13/21  5:02 PM  Result Value Ref Range   Glucose-Capillary 127 (H) 70 - 99 mg/dL    Comment: Glucose reference range applies only to samples taken after fasting for at least 8 hours.  Glucose, capillary     Status: Abnormal   Collection Time: 01/13/21  7:44 PM  Result Value Ref Range   Glucose-Capillary 158 (H) 70 - 99 mg/dL    Comment: Glucose reference range applies only to samples taken after fasting for at least 8 hours.  Glucose, capillary     Status: Abnormal   Collection Time: 01/14/21  6:10 AM  Result Value Ref Range   Glucose-Capillary 121 (H) 70 - 99 mg/dL     Comment: Glucose reference range applies only to samples taken after fasting for at least 8 hours.  Glucose, capillary     Status: Abnormal   Collection Time: 01/14/21 12:05 PM  Result Value Ref Range   Glucose-Capillary 168 (H) 70 - 99 mg/dL    Comment: Glucose reference range applies only to samples taken after fasting for at least 8 hours.    Blood Alcohol level:  Lab Results  Component Value Date   ETH <10 01/08/2021   ETH <10 [id number]    Metabolic Disorder Labs: Lab Results  Component Value Date   HGBA1C 5.8 (H) 01/11/2021   MPG 119.76 01/11/2021   MPG 131.24 08/16/2020   No results found for: PROLACTIN Lab Results  Component Value Date   CHOL 152 01/11/2021   TRIG 130 01/11/2021   HDL 50 01/11/2021   CHOLHDL 3.0 01/11/2021   VLDL 26 01/11/2021   LDLCALC 76 01/11/2021    Physical Findings: AIMS:   ,  ,   ,   ,     CIWA:    COWS:     Musculoskeletal: Strength & Muscle Tone: within normal limits Gait & Station: Steady Patient leans: N/A  Psychiatric Specialty Exam:  Presentation  General Appearance: Appropriate for Environment; Fairly Groomed; Casual   Eye Contact:Good   Speech:Normal Rate (Mainly clear and coherent, however mumbling at times)   Speech Volume:Normal   Handedness:Right    Mood and Affect  Mood:-- (Guarded, paranoid)   Affect:Congruent; Full Range    Thought Process  Thought Processes:Irrevelant (Less disorganized and irrelevant. Goal directed)   Descriptions of Associations:Loose   Orientation:Partial   Thought Content:Perseveration; Tangential; Scattered; Paranoid Ideation; Illogical   History of Schizophrenia/Schizoaffective disorder:Yes   Duration of Psychotic Symptoms:Greater than six months  Hallucinations:Hallucinations:  None   Ideas of Reference:None   Suicidal Thoughts:Suicidal Thoughts: No   Homicidal Thoughts:Homicidal Thoughts: No    Sensorium  Memory:Remote Poor; Immediate  Good   Judgment:Fair   Insight:Lacking    Executive Functions  Concentration:Fair   Attention Span:Fair   [place]    Psychomotor Activity  Psychomotor Activity:Psychomotor Activity: Tremor (Resting tremors present, although improved. No restlessness, stiffness, cogwheeling, waxy flexibility, tics, gait abnormality, nor involuntary movement of head, face, mouth or trunk noted on encounter. AIMs 0.)   Assets  Assets:Desire for Improvement; Resilience    Sleep  Sleep: Hours not recorded    Physical Exam: Physical Exam Vitals and nursing note reviewed.  Constitutional:      General: He is awake. He is not in acute distress.    Appearance: He is not ill-appearing or diaphoretic.  HENT:     Head: Normocephalic.  Pulmonary:     Effort: Pulmonary effort is normal.  Neurological:     General: No focal deficit present.     Mental Status: He is alert.     Motor: Tremor present.  Psychiatric:        Behavior: Behavior is cooperative.   Review of Systems  Respiratory:  Negative for shortness of breath.   Cardiovascular:  Negative for chest pain.  Gastrointestinal:  Negative for nausea and vomiting.  Neurological:  Negative for dizziness and headaches.  Blood pressure 132/76, pulse 89, temperature 97.9 F (36.6 C), temperature source Oral, resp. rate 16, height 6\' 2"  (1.88 m), weight 91.6 kg, SpO2 100 %. Body mass index is 25.94 kg/m.   Treatment Plan & Assessment Summary: Principal Problem:   Schizophrenia ([place])   Tylil Kanhai is a 51 y.o. male with PPHx of schizophrenia, seizures (on Keppra, Depakote, lacosamide) living at a boarding home who presented involuntarily via [employer] after attempting to walk into heavy traffic and was found to be psychotic and responding internally, now admitted for management of acute psychosis.  [place] Day 3   PLAN: Schizophrenia by history Patient reported no longer experiencing AVH  and thought process is less disorganized.  However patient is still disoriented, paranoid, and delusional.  Patient admitted to cocaine use prior to admission, considering this is a possibility for patient's acute psychosis along with non-adherence. 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 Patient's tremor has shown improvement. Will continue to monitor. Discontinued Cogentin 0.5 mg daily Continued amantadine 100 mg (started 1/6) and holding Austedo at this time Monitor with AIMS and tremors D/C'd home Austedo for presumed TD   Other medical conditions: Seizure disorder: Continued home Depakote DR 1000 mg BID, lacosamide 150 mg BID, Keppra 500 mg BID HLD: Continued home rosuvastatin 20 mg qHS T2DM: Continued home Farxiga 10 mg daily, did not restart home semaglutide; FSBS AC/HS with SSI ordered H/o CKD?:  Held home finerenone H/o COPD: Held home Symbicort, Spiriva Respimat  Dispo: Pending  Safety, monitoring and disposition planning: The patient was seen and evaluated on the unit.  The patient's chart was reviewed and nursing notes were reviewed.  The patient's case was discussed in multidisciplinary team meeting.  Plan and drug side effects were  discussed with patient who was amendable. Social work and case management to assist with discharge planning and identification of hospital follow-up needs prior to discharge. Discharge Concerns: Need to establish a safety plan; Medication compliance and effectiveness Discharge Goals: Return home with outpatient referrals for mental health follow-up including medication management/psychotherapy Short Term Goals: Ability to  identify triggers associated with substance abuse/mental health issues will improve Long Term Goals: Improvement in symptoms so as ready for discharge Safety and Monitoring: Involuntary admission to inpatient psychiatric unit for safety, stabilization and treatment Daily contact with patient to assess and evaluate symptoms and progress in treatment and medical management Patient's case to be discussed in multi-disciplinary team meeting Observation Level : q15 minute checks Vital signs:  q12 hours Precautions: suicide I certify that inpatient services furnished can reasonably be expected to improve the patient's condition.    Signed: Merrily Brittle, DO Psychiatry Resident, PGY-1 [facility] 01/14/2021, 5:11 PM

## 2021-01-14 NOTE — [facility] Suicide Risk Assessment (Signed)
[facility] INPATIENT:  Family/Significant Other Suicide Prevention Education  Suicide Prevention Education:  Contact Attempts: Caregiver- Harvie Heck [phone]), has been identified by the patient as the family member/significant other with whom the patient will be residing, and identified as the person(s) who will aid the patient in the event of a mental health crisis.  With written consent from the patient, two attempts were made to provide suicide prevention education, prior to and/or following the patient's discharge.  We were unsuccessful in providing suicide prevention education.  A suicide education pamphlet was given to the patient to share with family/significant other.  Date and time of first attempt:01/11/2021 / 10:30am CSW left HIPAA compliant voicemail requesting return call.  Date and time of second attempt: 01/14/2021 / 11:50am CSW left HIPAA compliant voicemail. CSW will attempt to reach caregivers supervisor.   Melayah Skorupski A Elika Godar 01/14/2021, 11:50 AM

## 2021-01-14 NOTE — Progress Notes (Signed)
Patient denies SI, HI and AVH this shift. Patient reports that the medications as working and the AVH has diminished. Patient has been compliant with medications, attended groups and has had no incident of behavioral dyscontrol.   Assess patient for safety, offer medications as prescribed and engage patient in 1:1 therapeutic talks.   Continue to monitor as planned. Patient able to contract for safety.

## 2021-01-14 NOTE — Group Note (Signed)
LCSW Group Therapy Notes ° °Type of Therapy and Topic: Group Therapy: Healthy Vs. Unhealthy Coping Strategies ° °Date and Time: 01/14/2021 at 1:00pm ° °Participation Level: [facility] PARTICIPATION LEVEL: Did Not Attend ° °Description of Group:  °In this group, patients will be encouraged to explore their healthy and unhealthy coping strategics. Coping strategies are actions that we take to deal with stress, problems, or uncomfortable emotions in our daily lives. Each patient will be challenged to read some scenarios and discuss the unhealthy and healthy coping strategies within those scenarios. Also, each patient will be challenged to describe current healthy and unhealthy strategies that they use in their own lives and discuss the outcomes and barriers to those strategies. This group will be process-oriented, with patients participating in exploration of their own experiences as well as giving and receiving support and challenge from other group members. ° °Therapeutic Goals: °Patient will identify personal healthy and unhealthy coping strategies. °Patient will identify healthy and unhealthy coping strategies, in others, through scenarios.  °Patient will identify expected outcomes of healthy and unhealthy coping strategies. °Patient will identify barriers to using healthy coping strategies.  ° °Summary of Patient Progress: Did not attend ° ° °Therapeutic Modalities: ° °Cognitive Behavioral Therapy °Solution Focused Therapy °Motivational Interviewing ° °Roshini Fulwider MSW, LCSW °Clincal Social Worker  °[facility]  °

## 2021-01-14 NOTE — Progress Notes (Signed)
Adult Psychoeducational Group Note  Date:  01/14/2021 Time:  8:22 PM  Group Topic/Focus:  Wrap-Up Group:   The focus of this group is to help patients review their daily goal of treatment and discuss progress on daily workbooks.  Participation Level:  Active  Participation Quality:  Appropriate  Affect:  Appropriate  Cognitive:  Appropriate  Insight: Appropriate  Engagement in Group:  Engaged  Modes of Intervention:  Discussion  Additional Comments:  Pt stated his goal for today was to focus on his treatment plan. Pt stated he accomplished his goal today. Pt stated he talked with his doctor and social worker about his care today. Pt rated his overall day a 10. Pt stated he made no calls today. Pt stated he felt better about himself today. Pt stated he was able to attend all meals. Pt stated he took all medications provided today.  Pt stated his appetite was pretty good today. Pt rated sleep last night was pretty good. Pt stated the goal tonight was to get some rest. Pt stated he had no physical pain tonight. Pt deny visual hallucinations and auditory issues tonight. Pt denies thoughts of harming himself or others. Pt stated he would alert staff if anything changed.  Candy Sledge 01/14/2021, 8:22 PM

## 2021-01-15 LAB — CBC
HCT: 41.8 % (ref 39.0–52.0)
Hemoglobin: 13.5 g/dL (ref 13.0–17.0)
MCH: 30.2 pg (ref 26.0–34.0)
MCHC: 32.3 g/dL (ref 30.0–36.0)
MCV: 93.5 fL (ref 80.0–100.0)
Platelets: 240 10*3/uL (ref 150–400)
RBC: 4.47 MIL/uL (ref 4.22–5.81)
RDW: 13.6 % (ref 11.5–15.5)
WBC: 6.3 10*3/uL (ref 4.0–10.5)
nRBC: 0 % (ref 0.0–0.2)

## 2021-01-15 LAB — COMPREHENSIVE METABOLIC PANEL
ALT: 18 U/L (ref 0–44)
AST: 18 U/L (ref 15–41)
Albumin: 3.7 g/dL (ref 3.5–5.0)
Alkaline Phosphatase: 54 U/L (ref 38–126)
Anion gap: 9 (ref 5–15)
BUN: 15 mg/dL (ref 6–20)
CO2: 27 mmol/L (ref 22–32)
Calcium: 9.6 mg/dL (ref 8.9–10.3)
Chloride: 100 mmol/L (ref 98–111)
Creatinine, Ser: 1.05 mg/dL (ref 0.61–1.24)
GFR, Estimated: >60 mL/min (ref >60)
Glucose, Bld: 161 mg/dL — ABNORMAL HIGH (ref 70–99)
Potassium: 4.3 mmol/L (ref 3.5–5.1)
Sodium: 136 mmol/L (ref 135–145)
Total Bilirubin: 0.3 mg/dL (ref 0.3–1.2)
Total Protein: 7.9 g/dL (ref 6.5–8.1)

## 2021-01-15 LAB — GLUCOSE, CAPILLARY
Glucose-Capillary: 132 mg/dL — ABNORMAL HIGH (ref 70–99)
Glucose-Capillary: 192 mg/dL — ABNORMAL HIGH (ref 70–99)
Glucose-Capillary: 261 mg/dL — ABNORMAL HIGH (ref 70–99)
Glucose-Capillary: 287 mg/dL — ABNORMAL HIGH (ref 70–99)

## 2021-01-15 LAB — VALPROIC ACID LEVEL: Valproic Acid Lvl: 65 ug/mL (ref 50.0–100.0)

## 2021-01-15 MED ORDER — OLANZAPINE 15 MG PO TBDP
15.0000 mg | ORAL_TABLET | Freq: Every day | ORAL | Status: DC
  Administered 2021-01-15 – 2021-01-16 (×2): 15 mg via ORAL
  Filled 2021-01-15 (×5): qty 1

## 2021-01-15 NOTE — Group Note (Signed)
Recreation Therapy Group Note   Group Topic:Animal Assisted Therapy   Group Date: 01/15/2021 Start Time: 1430 End Time: 1510 Facilitators: Caroll Rancher, LRT,CTRS Location: [address] Dayroom   Animal-Assisted Activity (AAA) Program Checklist/Progress Note Patient Eligibility Criteria Checklist & Daily Group note for Rec Tx Intervention   AAA/T Program Assumption of Risk Form signed by Patient/ or Parent Legal Guardian YES  Patient understands their participation is voluntary YES  Group Description: Patients provided opportunity to interact with trained and credentialed [employer] Therapy dog and the community volunteer/dog handler. Patients practiced appropriate animal interaction and were educated on dog safety outside of the hospital in common community settings. Patients were allowed to use dog toys and other items to practice commands, engage the dog in play, and/or complete routine aspects of animal care.   Education: Charity fundraiser, Health visitor, Communication & Social Skills    Affect/Mood: N/A   Participation Level: Did not attend    Clinical Observations/Individualized Feedback:     Plan: Continue to engage patient in RT group sessions 2-3x/week.   Caroll Rancher, Antonietta Jewel 01/15/2021 3:56 PM

## 2021-01-15 NOTE — Progress Notes (Signed)
°   01/15/21 0530  Sleep  Number of Hours 5

## 2021-01-15 NOTE — Progress Notes (Incomplete)
Patient denies SI, H this shift. Patient reports that the medications as working and the AVH has diminished. Patient reports hearing singing and seeing shadows.  Patient has been compliant with medications, attended groups and has had no incident of behavioral dyscontrol.    Assess patient for safety, offer medications as prescribed and engage patient in 1:1 therapeutic talks.    Continue to monitor as planned. Patient able to contract for safety.

## 2021-01-15 NOTE — Progress Notes (Addendum)
[facility] MD Progress Note  01/15/2021 3:44 PM Daniel Foley  MRN: [id number] CC: Psychosis  Subjective: Daniel Foley is a 51 y.o. male with PPHx of schizophrenia, seizures (on Keppra, Depakote, lacosamide) living at a boarding home who presented involuntarily via [employer] after attempting to walk into heavy traffic and was found to be psychotic and responding internally, now admitted for management of acute psychosis.  Daniel Foley 4   Yesterday's recommendation by the Psychiatry team Continued Zyprexa to 5mg  qam and 10mg  qhs for psychosis and d/c home Haldol (QTC 316ms, A1c 5.8, Lipid panel WNL) Continued amantadine 100 mg (started 1/6) and holding Austedo at this time  Interim History: Patient was initially seen laying in bed in the dark in the afternoon and was cooperative and pleasant on evaluation today.  Patient stated that his sleep, mood, and appetite are good.  Patient was moved from 500-400 hall, and he endorsed that the transition was fine.  Endorsed [facility] black men standing around in music playing.  Patient denied that the [facility] were interact with him or talking to him, however stated that he does feel threatened by them.  When asked if he heard anything that no one else can hear, he stated "no". Of however patient stated "I do not want to go back to the boardinghouse, because the owner was sitting on my lap".  In response to why, patient stated that he does not know, however endorsed that there was someone after him.  Patient also admitted to ideas of reference, stating that he is getting precise message for him from an unknown source.  Also that he does not remember what the messages were.  Because patient did not want to go back to the boarding home, asked where patient could go.  He said that he does not know, but he cannot go back to the court house.  He has no other method of contact, denied having a cell phone.  Also that his niece does not know that he is here. Otherwise he denied SI/HI. Patient  denied thought insertion/broadcasting, denied magical powers including electricity his hands, denied being able to read our minds.  Patient was not grossly responding to internal/external stimuli.  Patient had no other concerns or questions.   Diagnosis:  Principal Problem:   Schizophrenia ([place])  Total Time spent with patient:  I personally spent 30 minutes on the unit in direct patient care. The direct patient care time included face-to-face time with the patient, reviewing the patient's chart, communicating with other professionals, and coordinating care. Greater than 50% of this time was spent in counseling or coordinating care with the patient regarding goals of hospitalization, psycho-education, and discharge planning needs.   Past Psychiatric History: See H&P  Past Medical History:  Past Medical History:  Diagnosis Date   Chronic kidney disease    Diabetes mellitus without complication ([place])    Neuromuscular disorder ([place])    Schizophrenia ([place])    History reviewed. No pertinent surgical history. Family History:  Family History  Problem Relation Age of Onset   Seizures Brother    Family Psychiatric  History: See H&P Social History:  Social History   Substance and Sexual Activity  Alcohol Use Yes     Social History   Substance and Sexual Activity  Drug Use Not Currently    Social History   Socioeconomic History   Marital status: Unknown    Spouse name: Not on file   Number of children: Not on file   Years  of education: Not on file   Highest education level: Not on file  Occupational History   Not on file  Tobacco Use   Smoking status: Every Foley    Packs/Foley: 0.25    Years: 0.50    Pack years: 0.13    Types: Cigarettes   Smokeless tobacco: Never  Vaping Use   Vaping Use: Never used  Substance and Sexual Activity   Alcohol use: Yes   Drug use: Not Currently   Sexual activity: Not Currently    Birth control/protection: Condom  Other Topics Concern   Not  on file  Social History Narrative   Not on file   Social Determinants of Health   Financial Resource Strain: Not on file  Food Insecurity: Not on file  Transportation Needs: Not on file  Physical Activity: Not on file  Stress: Not on file  Social Connections: Not on file   Additional Social History:                         Sleep: Per above  Appetite:  Per above  Current Medications: Current Facility-Administered Medications  Medication Dose Route Frequency Provider Last Rate Last Admin   acetaminophen (TYLENOL) tablet 650 mg  650 mg Oral Q6H PRN Starkes-Perry, Gayland Curry, FNP       alum & mag hydroxide-simeth (MAALOX/MYLANTA) 200-200-20 MG/5ML suspension 30 mL  30 mL Oral Q4H PRN Starkes-Perry, Gayland Curry, FNP       amantadine (SYMMETREL) capsule 100 mg  100 mg Oral BID Merrily Brittle, DO   100 mg at 01/15/21 0736   dapagliflozin propanediol (FARXIGA) tablet 10 mg  10 mg Oral Daily Suella Broad, FNP   10 mg at 01/15/21 1213   divalproex (DEPAKOTE) DR tablet 1,000 mg  1,000 mg Oral BID Suella Broad, FNP   1,000 mg at 01/15/21 0735   insulin aspart (novoLOG) injection 0-9 Units  0-9 Units Subcutaneous TID WC Harlow Asa, MD   2 Units at 01/15/21 1214   lacosamide (VIMPAT) tablet 150 mg  150 mg Oral BID Merrily Brittle, DO   150 mg at 01/15/21 1215   levETIRAcetam (KEPPRA) tablet 500 mg  500 mg Oral BID Suella Broad, FNP   500 mg at 01/15/21 0736   OLANZapine zydis (ZYPREXA) disintegrating tablet 5 mg  5 mg Oral Q8H PRN Massengill, Ovid Curd, MD       And   LORazepam (ATIVAN) tablet 1 mg  1 mg Oral PRN Massengill, Ovid Curd, MD       And   ziprasidone (GEODON) injection 20 mg  20 mg Intramuscular PRN Massengill, Ovid Curd, MD       magnesium hydroxide (MILK OF MAGNESIA) suspension 30 mL  30 mL Oral Daily PRN Starkes-Perry, Gayland Curry, FNP       nicotine (NICODERM CQ - dosed in mg/24 hours) patch 14 mg  14 mg Transdermal Daily Suella Broad, FNP   14  mg at 01/15/21 0737   OLANZapine zydis (ZYPREXA) disintegrating tablet 10 mg  10 mg Oral QHS Nelda Marseille, Amy E, MD   10 mg at 01/14/21 2047   OLANZapine zydis (ZYPREXA) disintegrating tablet 5 mg  5 mg Oral Daily Nelda Marseille, Amy E, MD   5 mg at 01/15/21 0736   rosuvastatin (CRESTOR) tablet 20 mg  20 mg Oral QPM Merrily Brittle, DO   20 mg at 01/14/21 1727    Lab Results:  Results for orders placed or performed during  the hospital encounter of 01/11/21 (from the past 48 hour(s))  Glucose, capillary     Status: Abnormal   Collection Time: 01/13/21  5:02 PM  Result Value Ref Range   Glucose-Capillary 127 (H) 70 - 99 mg/dL    Comment: Glucose reference range applies only to samples taken after fasting for at least 8 hours.  Glucose, capillary     Status: Abnormal   Collection Time: 01/13/21  7:44 PM  Result Value Ref Range   Glucose-Capillary 158 (H) 70 - 99 mg/dL    Comment: Glucose reference range applies only to samples taken after fasting for at least 8 hours.  Glucose, capillary     Status: Abnormal   Collection Time: 01/14/21  6:10 AM  Result Value Ref Range   Glucose-Capillary 121 (H) 70 - 99 mg/dL    Comment: Glucose reference range applies only to samples taken after fasting for at least 8 hours.  Glucose, capillary     Status: Abnormal   Collection Time: 01/14/21 12:05 PM  Result Value Ref Range   Glucose-Capillary 168 (H) 70 - 99 mg/dL    Comment: Glucose reference range applies only to samples taken after fasting for at least 8 hours.  Glucose, capillary     Status: Abnormal   Collection Time: 01/14/21  5:14 PM  Result Value Ref Range   Glucose-Capillary 262 (H) 70 - 99 mg/dL    Comment: Glucose reference range applies only to samples taken after fasting for at least 8 hours.  Glucose, capillary     Status: Abnormal   Collection Time: 01/15/21  5:41 AM  Result Value Ref Range   Glucose-Capillary 132 (H) 70 - 99 mg/dL    Comment: Glucose reference range applies only to samples  taken after fasting for at least 8 hours.  Valproic acid level     Status: None   Collection Time: 01/15/21  6:20 AM  Result Value Ref Range   Valproic Acid Lvl 65 50.0 - 100.0 ug/mL    Comment: Performed at [facility], [place] [address], [place], [place] [address]  CBC     Status: None   Collection Time: 01/15/21  6:20 AM  Result Value Ref Range   WBC 6.3 4.0 - 10.5 K/uL   RBC 4.47 4.22 - 5.81 MIL/uL   Hemoglobin 13.5 13.0 - 17.0 g/dL   HCT 41.8 39.0 - 52.0 %   MCV 93.5 80.0 - 100.0 fL   MCH 30.2 26.0 - 34.0 pg   MCHC 32.3 30.0 - 36.0 g/dL   RDW 13.6 11.5 - 15.5 %   Platelets 240 150 - 400 K/uL   nRBC 0.0 0.0 - 0.2 %    Comment: Performed at [facility], [place] [address] [place], [place] [address]  Comprehensive metabolic panel     Status: Abnormal   Collection Time: 01/15/21  6:20 AM  Result Value Ref Range   Sodium 136 135 - 145 mmol/L   Potassium 4.3 3.5 - 5.1 mmol/L   Chloride 100 98 - 111 mmol/L   CO2 27 22 - 32 mmol/L   Glucose, Bld 161 (H) 70 - 99 mg/dL    Comment: Glucose reference range applies only to samples taken after fasting for at least 8 hours.   BUN 15 6 - 20 mg/dL   Creatinine, Ser 1.05 0.61 - 1.24 mg/dL   Calcium 9.6 8.9 - 10.3 mg/dL   Total Protein 7.9 6.5 - 8.1 g/dL   Albumin 3.7  3.5 - 5.0 g/dL   AST 18 15 - 41 U/L   ALT 18 0 - 44 U/L   Alkaline Phosphatase 54 38 - 126 U/L   Total Bilirubin 0.3 0.3 - 1.2 mg/dL   GFR, Estimated >60 >60 mL/min    Comment: (NOTE) Calculated using the CKD-EPI Creatinine Equation (2021)    Anion gap 9 5 - 15    Comment: Performed at [facility], [place] [address] [place], [place] [address]  Glucose, capillary     Status: Abnormal   Collection Time: 01/15/21 12:02 PM  Result Value Ref Range   Glucose-Capillary 192 (H) 70 - 99 mg/dL    Comment: Glucose reference range applies only to samples taken after fasting for at least 8 hours.    Blood Alcohol level:  Lab  Results  Component Value Date   ETH <10 01/08/2021   ETH <10 [id number]    Metabolic Disorder Labs: Lab Results  Component Value Date   HGBA1C 5.8 (H) 01/11/2021   MPG 119.76 01/11/2021   MPG 131.24 08/16/2020   No results found for: PROLACTIN Lab Results  Component Value Date   CHOL 152 01/11/2021   TRIG 130 01/11/2021   HDL 50 01/11/2021   CHOLHDL 3.0 01/11/2021   VLDL 26 01/11/2021   LDLCALC 76 01/11/2021    Physical Findings: AIMS:  Facial and Oral Movements Muscles of Facial Expression: None, normal Lips and Perioral Area: Minimal Jaw: None, normal Tongue: None, normal, Extremity Movements Upper (arms, wrists, hands, fingers): None, normal Lower (legs, knees, ankles, toes): None, normal,  Trunk Movements Neck, shoulders, hips: None, normal,  Overall Severity Severity of abnormal movements (highest score from questions above): None, normal Incapacitation due to abnormal movements: None, normal Patient's awareness of abnormal movements (rate only patient's report): No Awareness,  Dental Status Current problems with teeth and/or dentures?: Yes Does patient usually wear dentures?: No  CIWA:    COWS:     Musculoskeletal: Strength & Muscle Tone: within normal limits Gait & Station: Steady Patient leans: N/A  Psychiatric Specialty Exam:  Presentation  General Appearance: Disheveled   Eye Contact:Good   Speech:Clear and Coherent; Normal Rate   Speech Volume:Normal   Handedness:Right    Mood and Affect  Mood:Anxious   Affect:Appropriate; Congruent; Full Range    Thought Process  Thought Processes:Irrevelant; Disorganized; Goal Directed (Initially goal directed, however would then stated disorganized phrase)   Descriptions of Associations:Circumstantial   Orientation:Partial   Thought Content:Perseveration; Paranoid Ideation; Delusions (Continues to perseverate on not wanting to go back to the boardinghouse)   History of  Schizophrenia/Schizoaffective disorder:Yes   Duration of Psychotic Symptoms:Greater than six months  Hallucinations:Hallucinations: Auditory; Visual   Ideas of Reference:Delusions   Suicidal Thoughts:Suicidal Thoughts: No   Homicidal Thoughts:Homicidal Thoughts: No    Sensorium  Memory:Immediate Good; Recent Good; Remote Fair   Judgment:Fair   Insight:Lacking    Executive Functions  Concentration:Good   Attention Span:Good   [place]   Language:Good    Psychomotor Activity  Psychomotor Activity:Psychomotor Activity: Restlessness; Tremor   Assets  Assets:Communication Skills; Desire for Improvement    Sleep  Sleep: 5 hours     Physical Exam: Physical Exam Vitals and nursing note reviewed.  Constitutional:      General: He is awake. He is not in acute distress.    Appearance: He is not ill-appearing or diaphoretic.  HENT:     Head: Normocephalic.  Pulmonary:     Effort:  Pulmonary effort is normal.  Neurological:     General: No focal deficit present.     Mental Status: He is alert.     Motor: Tremor present.  Psychiatric:        Behavior: Behavior is cooperative.   Review of Systems  Respiratory:  Negative for shortness of breath.   Cardiovascular:  Negative for chest pain.  Gastrointestinal:  Negative for nausea and vomiting.  Neurological:  Negative for dizziness and headaches.  Blood pressure 111/72, pulse (!) 111, temperature 98 F (36.7 C), temperature source Oral, resp. rate 16, height 6\' 2"  (1.88 m), weight 91.6 kg, SpO2 99 %. Body mass index is 25.94 kg/m.   Treatment Plan & Assessment Summary: Principal Problem:   Schizophrenia ([place])   Daniel Foley is a 51 y.o. male with PPHx of schizophrenia, seizures (on Keppra, Depakote, lacosamide) living at a boarding home who presented involuntarily via [employer] after attempting to walk into heavy traffic and was found to be psychotic and responding  internally, now admitted for management of acute psychosis exacerbated by recent cocaine use.  [place] Foley 4   PLAN: Schizophrenia by history Patient endorsed AVH again, after about 24 hours of no hallucinations.  In regards to his disorganized thought and speech, he is stable from yesterday.  He continues to be paranoid and delusional.  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 Unclear if TD.  His tremors have worsened today a bit, we will consider increasing his amantadine tomorrow, given that we changed his antipsychotic per above. Discontinued home Cogentin 0.5 mg daily Continue amantadine 100 mg (started 1/6) and holding Austedo at this time Monitor with AIMS and tremors D/C'd home Austedo for presumed TD  Other medical conditions: Seizure disorder: Continued home Depakote DR 1000 mg BID, lacosamide 150 mg BID, Keppra 500 mg BID.  VPA level returned now therapeutic. HLD: Continued home rosuvastatin 20 mg qHS T2DM: Continued home Farxiga 10 mg daily, did not restart home semaglutide; FSBS AC/HS with SSI ordered H/o CKD?:  Held home finerenone H/o COPD: Held home Symbicort, Spiriva Respimat  Dispo: Pending  Safety, monitoring and disposition planning: The patient was seen and evaluated on the unit.  The patient's chart was reviewed and nursing notes were reviewed.  The patient's case was discussed in multidisciplinary team meeting.  Plan and drug side effects were discussed with patient who was amendable. Social work and case management to assist with discharge planning and identification of hospital follow-up needs prior to discharge. Discharge Concerns: Need to  establish a safety plan; Medication compliance and effectiveness Discharge Goals: Return home with outpatient referrals for mental health follow-up including medication management/psychotherapy Short Term Goals: Ability to identify triggers associated with substance abuse/mental health issues will improve Long Term Goals: Improvement in symptoms so as ready for discharge Safety and Monitoring: Involuntary admission to inpatient psychiatric unit for safety, stabilization and treatment Daily contact with patient to assess and evaluate symptoms and progress in treatment and medical management Patient's case to be discussed in multi-disciplinary team meeting Observation Level : q15 minute checks Vital signs:  q12 hours Precautions: suicide I certify that inpatient services furnished can reasonably be expected to improve the patient's  condition.    Signed: Merrily Brittle, DO Psychiatry Resident, PGY-1 [facility] 01/15/2021, 3:44 PM

## 2021-01-16 ENCOUNTER — Encounter (HOSPITAL_COMMUNITY): Payer: Self-pay

## 2021-01-16 LAB — GLUCOSE, CAPILLARY
Glucose-Capillary: 155 mg/dL — ABNORMAL HIGH (ref 70–99)
Glucose-Capillary: 154 mg/dL — ABNORMAL HIGH (ref 70–99)
Glucose-Capillary: 200 mg/dL — ABNORMAL HIGH (ref 70–99)
Glucose-Capillary: 256 mg/dL — ABNORMAL HIGH (ref 70–99)

## 2021-01-16 MED ORDER — DOCUSATE SODIUM 100 MG PO CAPS
100.0000 mg | ORAL_CAPSULE | Freq: Two times a day (BID) | ORAL | Status: DC
  Administered 2021-01-16 – 2021-01-25 (×18): 100 mg via ORAL
  Filled 2021-01-16 (×22): qty 1

## 2021-01-16 MED ORDER — OLANZAPINE 10 MG PO TBDP
20.0000 mg | ORAL_TABLET | Freq: Every day | ORAL | Status: DC
  Filled 2021-01-16 (×2): qty 2

## 2021-01-16 MED ORDER — AMANTADINE HCL 100 MG PO CAPS
100.0000 mg | ORAL_CAPSULE | Freq: Every day | ORAL | Status: DC
  Administered 2021-01-17: 100 mg via ORAL
  Filled 2021-01-16 (×2): qty 1

## 2021-01-16 MED ORDER — BENZTROPINE MESYLATE 0.5 MG PO TABS
0.5000 mg | ORAL_TABLET | Freq: Two times a day (BID) | ORAL | Status: DC
  Filled 2021-01-16 (×3): qty 1

## 2021-01-16 MED ORDER — BENZTROPINE MESYLATE 0.5 MG PO TABS: 0.5000 mg | ORAL_TABLET | Freq: Two times a day (BID) | ORAL | Status: DC

## 2021-01-16 MED ORDER — MELATONIN 3 MG PO TABS
3.0000 mg | ORAL_TABLET | Freq: Every day | ORAL | Status: DC
  Administered 2021-01-17 – 2021-01-24 (×8): 3 mg via ORAL
  Filled 2021-01-16 (×11): qty 1
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
## 2021-01-16 NOTE — Group Note (Signed)
Recreation Therapy Group Note   Group Topic:Stress Management  Group Date: 01/16/2021 Start Time: 0932 End Time: 0945 Facilitators: Caroll Rancher, [place] Location: [address] Dayroom   Goal Area(s) Addresses:  Patient will identify positive stress management techniques. Patient will identify benefits of using stress management post d/c.  Group Description: Meditation.  LRT played a meditation on having gratitude.  The meditation spoke of being grateful not for physical things but for experiences that have been overcome, people and the simple things such as life and having a place to live.  Patients were to listen and follow as meditation played to fully engage.   Affect/Mood: N/A   Participation Level: Did not attend    Clinical Observations/Individualized Feedback:     Plan: Continue to engage patient in RT group sessions 2-3x/week.   Caroll Rancher, LRT,CTRS 01/16/2021 1:00 PM

## 2021-01-16 NOTE — Progress Notes (Signed)
°   01/15/21 2120  Psych Admission Type (Psych Patients Only)  Admission Status Involuntary  Psychosocial Assessment  Patient Complaints Anxiety  Eye Contact Fair  Facial Expression Anxious  Affect Anxious  Speech Soft;Slow  Interaction Assertive  Motor Activity Tremors  Appearance/Hygiene Disheveled  Behavior Characteristics Appropriate to situation  Mood Anxious  Thought Process  Coherency Circumstantial  Content Blaming others  Delusions WDL  Perception Hallucinations  Hallucination Visual;Auditory  Judgment Impaired  Confusion WDL  Danger to Self  Current suicidal ideation? Denies  Danger to Others  Danger to Others None reported or observed

## 2021-01-16 NOTE — Group Note (Signed)
Date:  01/16/2021 Time:  9:41 AM  Group Topic/Focus:  Orientation:   The focus of this group is to educate the patient on the purpose and policies of crisis stabilization and provide a format to answer questions about their admission.  The group details unit policies and expectations of patients while admitted.    Participation Level:  Did Not Attend  Participation Quality:    Affect:    Cognitive:    Insight:   Engagement in Group:    Modes of Intervention:    Additional Comments:  Pt did not attend group  Garvin Fila 01/16/2021, 9:41 AM

## 2021-01-16 NOTE — Progress Notes (Signed)
°   01/16/21 2000  Psych Admission Type (Psych Patients Only)  Admission Status Involuntary  Psychosocial Assessment  Patient Complaints Suspiciousness  Eye Contact Fair  Facial Expression Flat  Affect Flat  Speech Soft;Slow;Logical/coherent  Interaction Isolative;Guarded  Motor Activity Other (Comment) (WDL)  Appearance/Hygiene In scrubs  Behavior Characteristics Cooperative  Mood Suspicious;Pleasant  Thought Process  Coherency Circumstantial  Content Blaming others  Delusions None reported or observed  Perception Hallucinations  Hallucination Auditory;Visual  Judgment Limited  Confusion None  Danger to Self  Current suicidal ideation? Denies  Danger to Others  Danger to Others None reported or observed

## 2021-01-16 NOTE — Progress Notes (Signed)
D- Patient alert and oriented x3. Pt calm. Roommate approached nurse with complaints of patient talking to someone in the room that was not there. Roommate stated patient was saying he was going to kill some people. When roommate asked patient if he was threatening him, patient confirmed he was not talking to roommate but talking to "his people" in the room. No one else was in the room. Patient was then asked what happened last night. Pt stated he was having AVH.    A- Charge nurse made aware. Routine safety checks conducted every 15 minutes.  Patient informed to notify staff with any more problems or concerns.    R- Patient compliant with medications and verbalized understanding treatment plan. Patient calm and cooperative.

## 2021-01-16 NOTE — Group Note (Unsigned)
Date:  01/16/2021 °Time:  9:34 AM ° °Group Topic/Focus:  °Orientation:   The focus of this group is to educate the patient on the purpose and policies of crisis stabilization and provide a format to answer questions about their admission.  The group details unit policies and expectations of patients while admitted. ° ° ° ° °Participation Level:  {[facility] PARTICIPATION LEVEL:22264} ° °Participation Quality:  {[facility] PARTICIPATION QUALITY:22265} ° °Affect:  {[facility] AFFECT:22266} ° °Cognitive:  {[facility] COGNITIVE:22267} ° °Insight: {[facility] Insight2:20797} ° °Engagement in Group:  {[facility] ENGAGEMENT IN GROUP:22268} ° °Modes of Intervention:  {[facility] MODES OF INTERVENTION:22269} ° °Additional Comments:  *** ° °Daniel Foley °01/16/2021, 9:34 AM ° °

## 2021-01-16 NOTE — Progress Notes (Signed)
Pt denies SI/HI/AVH and verbally agrees to approach staff if these become apparent or before harming themselves/others. Rates depression 0/10. Rates anxiety 0/10. Rates pain 1/10 in back. When was asked if pt wanted anything for the pain, pt asked "can I get a soda?"  Pt has been isolative in room all day. Scheduled medications administered to pt, per MD orders. RN provided support and encouragement to pt. Q15 min safety checks implemented and continued. Pt safe on the unit. RN will continue to monitor and intervene as needed.   01/16/21 0920  Psych Admission Type (Psych Patients Only)  Admission Status Involuntary  Psychosocial Assessment  Patient Complaints None  Eye Contact Fair  Facial Expression Flat  Affect Flat  Speech Soft;Slow;Logical/coherent  Interaction Isolative;Guarded  Motor Activity Other (Comment) (WDL)  Appearance/Hygiene In scrubs  Behavior Characteristics Appropriate to situation;Cooperative;Calm  Mood Pleasant;Anxious  Thought Process  Coherency Circumstantial  Content Blaming others  Delusions None reported or observed  Perception Hallucinations  Hallucination Auditory;Visual  Judgment Limited  Confusion None  Danger to Self  Current suicidal ideation? Denies  Danger to Others  Danger to Others None reported or observed

## 2021-01-16 NOTE — BH IP Treatment Plan (Signed)
Interdisciplinary Treatment and Diagnostic Plan Update  01/16/2021 Time of Session: 9:45am  Daniel Foley MRN: [id number]  Principal Diagnosis: Schizophrenia [facility])  Secondary Diagnoses: Principal Problem:   Schizophrenia (HCC)   Current Medications:  Current Facility-Administered Medications  Medication Dose Route Frequency Provider Last Rate Last Admin   acetaminophen (TYLENOL) tablet 650 mg  650 mg Oral Q6H PRN Starkes-Perry, Juel Burrow, FNP       alum & mag hydroxide-simeth (MAALOX/MYLANTA) 200-200-20 MG/5ML suspension 30 mL  30 mL Oral Q4H PRN Starkes-Perry, Juel Burrow, FNP       [START ON 01/17/2021] amantadine (SYMMETREL) capsule 100 mg  100 mg Oral Daily Massengill, Harrold Donath, MD       Melene Muller ON 01/17/2021] benztropine (COGENTIN) tablet 0.5 mg  0.5 mg Oral BID Massengill, Harrold Donath, MD       dapagliflozin propanediol (FARXIGA) tablet 10 mg  10 mg Oral Daily Maryagnes Amos, FNP   10 mg at 01/16/21 0854   divalproex (DEPAKOTE) DR tablet 1,000 mg  1,000 mg Oral BID Maryagnes Amos, FNP   1,000 mg at 01/16/21 0853   docusate sodium (COLACE) capsule 100 mg  100 mg Oral BID Massengill, Harrold Donath, MD       insulin aspart (novoLOG) injection 0-9 Units  0-9 Units Subcutaneous TID WC Comer Locket, MD   2 Units at 01/16/21 1143   lacosamide (VIMPAT) tablet 150 mg  150 mg Oral BID Princess Bruins, DO   150 mg at 01/16/21 0856   levETIRAcetam (KEPPRA) tablet 500 mg  500 mg Oral BID Maryagnes Amos, FNP   500 mg at 01/16/21 0853   OLANZapine zydis (ZYPREXA) disintegrating tablet 5 mg  5 mg Oral Q8H PRN Massengill, Harrold Donath, MD       And   LORazepam (ATIVAN) tablet 1 mg  1 mg Oral PRN Massengill, Harrold Donath, MD       And   ziprasidone (GEODON) injection 20 mg  20 mg Intramuscular PRN Massengill, Harrold Donath, MD       magnesium hydroxide (MILK OF MAGNESIA) suspension 30 mL  30 mL Oral Daily PRN Starkes-Perry, Juel Burrow, FNP       nicotine (NICODERM CQ - dosed in mg/24 hours) patch 14 mg  14 mg  Transdermal Daily Maryagnes Amos, FNP   14 mg at 01/16/21 0900   OLANZapine zydis (ZYPREXA) disintegrating tablet 15 mg  15 mg Oral QHS Princess Bruins, DO   15 mg at 01/15/21 2120   OLANZapine zydis (ZYPREXA) disintegrating tablet 5 mg  5 mg Oral Daily Mason Jim, Amy E, MD   5 mg at 01/16/21 0857   rosuvastatin (CRESTOR) tablet 20 mg  20 mg Oral QPM Princess Bruins, DO   20 mg at 01/15/21 3710   PTA Medications: Medications Prior to Admission  Medication Sig Dispense Refill Last Dose   albuterol (VENTOLIN HFA) 108 (90 Base) MCG/ACT inhaler Inhale 2 puffs into the lungs every 4 (four) hours as needed for shortness of breath.      benztropine (COGENTIN) 1 MG tablet Take 1 mg by mouth daily.      budesonide-formoterol (SYMBICORT) 160-4.5 MCG/ACT inhaler Inhale 2 puffs into the lungs 2 (two) times daily.      cholecalciferol (VITAMIN D) 25 MCG (1000 UNIT) tablet Take 1,000 Units by mouth daily.      dapagliflozin propanediol (FARXIGA) 10 MG TABS tablet Take 10 mg by mouth daily.      Deutetrabenazine (AUSTEDO) 6 MG TABS Take 6 mg by mouth in the  morning and at bedtime.      divalproex (DEPAKOTE ER) 500 MG 24 hr tablet Take 1,000 mg by mouth in the morning and at bedtime.      ferrous sulfate 325 (65 FE) MG tablet Take 325 mg by mouth daily with breakfast.      Finerenone (KERENDIA) 10 MG TABS Take 10 mg by mouth daily.      haloperidol (HALDOL) 5 MG tablet Take 5 mg by mouth daily.      Lacosamide 150 MG TABS Take 150 mg by mouth 2 (two) times daily.      levETIRAcetam (KEPPRA) 500 MG tablet Take 500 mg by mouth 2 (two) times daily.      nicotine (NICODERM CQ - DOSED IN MG/24 HOURS) 14 mg/24hr patch Place 14 mg onto the skin daily.      rosuvastatin (CRESTOR) 20 MG tablet Take 20 mg by mouth daily.      Semaglutide (RYBELSUS) 7 MG TABS Take 7 mg by mouth daily.      Tiotropium Bromide Monohydrate (SPIRIVA RESPIMAT) 1.25 MCG/ACT AERS Inhale 2 puffs into the lungs daily.       Patient  Stressors: Marital or family conflict   Medication change or noncompliance    Patient Strengths: Automotive engineerGeneral fund of knowledge  Motivation for treatment/growth   Treatment Modalities: Medication Management, Group therapy, Case management,  1 to 1 session with clinician, Psychoeducation, Recreational therapy.   Physician Treatment Plan for Primary Diagnosis: Schizophrenia (HCC) Long Term Goal(s):     Short Term Goals:    Medication Management: Evaluate patient's response, side effects, and tolerance of medication regimen.  Therapeutic Interventions: 1 to 1 sessions, Unit Group sessions and Medication administration.  Evaluation of Outcomes: Progressing  Physician Treatment Plan for Secondary Diagnosis: Principal Problem:   Schizophrenia (HCC)  Long Term Goal(s):     Short Term Goals:       Medication Management: Evaluate patient's response, side effects, and tolerance of medication regimen.  Therapeutic Interventions: 1 to 1 sessions, Unit Group sessions and Medication administration.  Evaluation of Outcomes: Progressing   RN Treatment Plan for Primary Diagnosis: Schizophrenia (HCC) Long Term Goal(s): Knowledge of disease and therapeutic regimen to maintain health will improve  Short Term Goals: Ability to remain free from injury will improve, Ability to participate in decision making will improve, Ability to verbalize feelings will improve, Ability to disclose and discuss suicidal ideas, and Ability to identify and develop effective coping behaviors will improve  Medication Management: RN will administer medications as ordered by provider, will assess and evaluate patient's response and provide education to patient for prescribed medication. RN will report any adverse and/or side effects to prescribing provider.  Therapeutic Interventions: 1 on 1 counseling sessions, Psychoeducation, Medication administration, Evaluate responses to treatment, Monitor vital signs and CBGs as  ordered, Perform/monitor CIWA, COWS, AIMS and Fall Risk screenings as ordered, Perform wound care treatments as ordered.  Evaluation of Outcomes: Progressing   LCSW Treatment Plan for Primary Diagnosis: Schizophrenia (HCC) Long Term Goal(s): Safe transition to appropriate next level of care at discharge, Engage patient in therapeutic group addressing interpersonal concerns.  Short Term Goals: Engage patient in aftercare planning with referrals and resources, Increase social support, Increase emotional regulation, Facilitate acceptance of mental health diagnosis and concerns, Identify triggers associated with mental health/substance abuse issues, and Increase skills for wellness and recovery  Therapeutic Interventions: Assess for all discharge needs, 1 to 1 time with Social worker, Explore available resources and support systems, Assess  for adequacy in community support network, Educate family and significant other(s) on suicide prevention, Complete Psychosocial Assessment, Interpersonal group therapy.  Evaluation of Outcomes: Progressing   Progress in Treatment: Attending groups: Yes. Participating in groups: Yes. Taking medication as prescribed: Yes. Toleration medication: Yes. Family/Significant other contact made: Yes, individual(s) contacted:  Caregiver Patient understands diagnosis: Yes. Discussing patient identified problems/goals with staff: Yes. Medical problems stabilized or resolved: Yes. Denies suicidal/homicidal ideation: Yes. Issues/concerns per patient self-inventory: No.   New problem(s) identified: No, Describe:  None  New Short Term/Long Term Goal(s): medication stabilization, elimination of SI thoughts, development of comprehensive mental wellness plan.   Patient Goals: "To try to go to work"   Discharge Plan or Barriers: Patient recently admitted. CSW will continue to follow and assess for appropriate referrals and possible discharge planning.   Reason for  Continuation of Hospitalization: Delusions  Hallucinations Medication stabilization  Estimated Length of Stay: 3 to 5 days    Scribe for Treatment Team: Aram Beecham, Theresia Majors 01/16/2021 3:09 PM

## 2021-01-16 NOTE — Group Note (Signed)
LCSW Group Therapy Note ° °Group Date: 01/16/2021 °Start Time: 1300 °End Time: 1400 ° ° °Type of Therapy and Topic:  Group Therapy: Self-Harm Alternatives ° °Participation Level:  Did Not Attend ° ° °Description of Group:   °Patients participated in a discussion regarding non-suicidal self-injurious behavior (NSSIB, or self-harm) and the stigma surrounding it. There was also discussion surrounding how other maladaptive coping skills could be seen as self-harm, such as substance abuse. Participants were invited to share their experiences with self-harm, with emphasis being placed on the motivation for self-harm (such as release, punishment, feeling numb, etc). Patients were then asked to brainstorm potential substitutions for self-harm and were provided with a handout entitled, "Distraction Techniques and Alternative Coping Strategies," published by [employer] Self-Injury Recovery. ° °Therapeutic Goals: ° °Patients will be given the opportunity to discuss NSSIB in a non-judgmental and therapeutic environment. °Patients will identify which feelings lead to NSSIB.  °Patients will discuss potential healthy coping skills to replace NSSIB °Open discussion will specifically address stigma and shame surrounding NSSIB.  ° °Summary of Patient Progress:  Did not attend ° °Therapeutic Modalities:   °Cognitive Behavioral Therapy ° ° °Lillieanna Tuohy M Trayce Caravello, LCSWA °01/16/2021  1:44 PM   °

## 2021-01-16 NOTE — Progress Notes (Signed)
Patient did not attend wrap up group. 

## 2021-01-17 LAB — GLUCOSE, CAPILLARY
Glucose-Capillary: 171 mg/dL — ABNORMAL HIGH (ref 70–99)
Glucose-Capillary: 109 mg/dL — ABNORMAL HIGH (ref 70–99)
Glucose-Capillary: 210 mg/dL — ABNORMAL HIGH (ref 70–99)
Glucose-Capillary: 307 mg/dL — ABNORMAL HIGH (ref 70–99)

## 2021-01-17 MED ORDER — AMANTADINE HCL 100 MG PO CAPS
100.0000 mg | ORAL_CAPSULE | Freq: Every day | ORAL | Status: DC
  Administered 2021-01-18 – 2021-01-21 (×4): 100 mg via ORAL
  Filled 2021-01-17 (×6): qty 1

## 2021-01-17 MED ORDER — OLANZAPINE 5 MG PO TBDP
5.0000 mg | ORAL_TABLET | Freq: Every day | ORAL | Status: DC
  Administered 2021-01-17 – 2021-01-25 (×9): 5 mg via ORAL
  Filled 2021-01-17 (×14): qty 1

## 2021-01-17 MED ORDER — OLANZAPINE 15 MG PO TBDP
15.0000 mg | ORAL_TABLET | Freq: Every day | ORAL | Status: DC
  Administered 2021-01-17 – 2021-01-24 (×8): 15 mg via ORAL
  Filled 2021-01-17 (×10): qty 1

## 2021-01-17 NOTE — Group Note (Signed)
Recreation Therapy Group Note   Group Topic:Coping Skills  Group Date: 01/17/2021 Start Time: 0950 End Time: 1030 Facilitators: Caroll Rancher, LRT,CTRS Location: [address] Dayroom   Goal Area(s) Addresses: Patient will define what a coping skill is. Patient will successfully identify positive coping skills they can use post d/c.  Patient will acknowledge benefit(s) of using learned coping skills post d/c.   Group Description: Coping A to Z. Patient asked to identify what a coping skill is and when they use them. Patients with Clinical research associate discussed healthy versus unhealthy coping skills. Next patients were given a blank worksheet titled "Coping Skills A-Z". Patients were instructed to come up with at least one positive coping skill per letter of the alphabet. Patients were given 15 minutes to complete task, before ideas were presented to the large group. Patients and LRT debriefed on the importance of coping skill selection based on situation and back-up plans when a skill tried is not effective.    Affect/Mood: Appropriate   Participation Level: Engaged   Participation Quality: Moderate Cues   Behavior: Appropriate   Speech/Thought Process: Relevant   Insight: Limited   Judgement: Limited   Modes of Intervention: Worksheet   Patient Response to Interventions:  Attentive   Education Outcome:  Acknowledges education and In group clarification offered    Clinical Observations/Individualized Feedback: Pt was attentive and appropriate during group.  Pt was able to complete some of the worksheet with the assistance of LRT.  Pt identified some coping skills as chill, listen to music, walking, brushing teeth, joking, push ups and napping.  Pt was called out of group to meet with doctor and did not return.    Plan: Continue to engage patient in RT group sessions 2-3x/week.   Caroll Rancher, LRT,CTRS 01/17/2021 11:19 AM

## 2021-01-17 NOTE — Progress Notes (Signed)
°   01/17/21 2000  Psych Admission Type (Psych Patients Only)  Admission Status Involuntary  Psychosocial Assessment  Patient Complaints Suspiciousness  Eye Contact Fair  Facial Expression Flat  Affect Flat  Speech Soft;Slow;Logical/coherent  Interaction Isolative;Guarded  Motor Activity Other (Comment) (WDL)  Appearance/Hygiene In scrubs  Behavior Characteristics Cooperative  Mood Pleasant  Thought Process  Coherency Circumstantial  Content Blaming others  Delusions None reported or observed  Perception Hallucinations  Hallucination Auditory;Visual  Judgment Limited  Confusion None  Danger to Self  Current suicidal ideation? Denies  Danger to Others  Danger to Others None reported or observed

## 2021-01-17 NOTE — Progress Notes (Signed)
°   01/17/21 0545  Sleep  Number of Hours 7.5

## 2021-01-17 NOTE — Progress Notes (Signed)
Psychoeducational Group Note  Date:  01/17/2021 Time:  2058  Group Topic/Focus:  Wrap-Up Group:   The focus of this group is to help patients review their daily goal of treatment and discuss progress on daily workbooks.  Participation Level: Did Not Attend  Participation Quality:  Not Applicable  Affect:  Not Applicable  Cognitive:  Not Applicable  Insight:  Not Applicable  Engagement in Group: Not Applicable  Additional Comments:  The patient did not attend group this evening.   Hazle Coca S 01/17/2021, 8:58 PM

## 2021-01-17 NOTE — Progress Notes (Signed)
°   01/17/21 1500  Psych Admission Type (Psych Patients Only)  Admission Status Involuntary  Psychosocial Assessment  Patient Complaints Suspiciousness  Eye Contact Fair  Facial Expression Flat  Affect Flat  Speech Soft;Slow;Logical/coherent  Interaction Isolative;Guarded  Motor Activity Other (Comment) (WDL)  Appearance/Hygiene In scrubs  Behavior Characteristics Cooperative  Mood Pleasant  Thought Process  Coherency Circumstantial  Content Blaming others  Delusions None reported or observed  Perception Hallucinations  Hallucination Auditory;Visual  Judgment Limited  Confusion None  Danger to Self  Current suicidal ideation? Denies  Danger to Others  Danger to Others None reported or observed

## 2021-01-17 NOTE — [facility] Counselor (Signed)
This patient has been referred to [employer] to determine Medicaid eligibility.    Ruthann Cancer MSW, LCSW Clincal Social Worker  [facility]

## 2021-01-17 NOTE — Progress Notes (Addendum)
[facility] MD Progress Note  01/17/2021 1:26 PM Daniel Foley  MRN: [id number] CC: Psychosis  Subjective: Daniel Foley is a 51 y.o. male with PPHx of schizophrenia, seizures (on Keppra, Depakote, lacosamide) living at a boarding home who presented involuntarily via [employer] after attempting to walk into heavy traffic and was found to be psychotic and responding internally, now admitted for management of acute psychosis.  [place] Day 6   Yesterday's recommendation by the Psychiatry team Consolidated Zyprexa 5mg  qam and 15mg  qhs to 20 mg qHS for psychosis and d/c home Haldol  Delirium precautions, bowel regimen Melatonin 3 mg qHS Decreased amantadine to 100 mg daily Holding home Austedo at this time We will consider re-starting cogentin in 1-2 days, based on response.   Interim History: Patient was seen participating group.  Patient stated that his mood, sleep, and appetite are good.  Patient is not able to recall conversation from day prior and is alert and oriented to self and month. Stated that he was in a hospital because of cigarette smoking.  Patient stated that he does not know where he lived prior to admission.  He remembers that he came from [place].  After reorientation, patient was not able to recall President, city, and year after a few minutes.  Patient also stated that everyone is out to get him, like music, otherwise he is not sure why. Patient denied SI/HI/AVH, first rank symptoms such as thought insertion/broadcasting, having super powers, ideas of reference and contracted to safety on the unit. Patient was not grossly responding to internal/external stimuli.  Diagnosis:  Principal Problem:   Schizophrenia ([place])  Total Time spent with patient:  I personally spent 30 minutes on the unit in direct patient care. The direct patient care time included face-to-face time with the patient, reviewing the patient's chart, communicating with other professionals, and coordinating care. Greater than  50% of this time was spent in counseling or coordinating care with the patient regarding goals of hospitalization, psycho-education, and discharge planning needs.   Past Psychiatric History: See H&P  Past Medical History:  Past Medical History:  Diagnosis Date   Chronic kidney disease    Diabetes mellitus without complication ([place])    Neuromuscular disorder ([place])    Schizophrenia ([place])    History reviewed. No pertinent surgical history. Family History:  Family History  Problem Relation Age of Onset   Seizures Brother    Family Psychiatric  History: See H&P Social History:  Social History   Substance and Sexual Activity  Alcohol Use Yes     Social History   Substance and Sexual Activity  Drug Use Not Currently    Social History   Socioeconomic History   Marital status: Unknown    Spouse name: Not on file   Number of children: Not on file   Years of education: Not on file   Highest education level: Not on file  Occupational History   Not on file  Tobacco Use   Smoking status: Every Day    Packs/day: 0.25    Years: 0.50    Pack years: 0.13    Types: Cigarettes   Smokeless tobacco: Never  Vaping Use   Vaping Use: Never used  Substance and Sexual Activity   Alcohol use: Yes   Drug use: Not Currently   Sexual activity: Not Currently    Birth control/protection: Condom  Other Topics Concern   Not on file  Social History Narrative   Not on file   Social Determinants of  Health   Financial Resource Strain: Not on file  Food Insecurity: Not on file  Transportation Needs: Not on file  Physical Activity: Not on file  Stress: Not on file  Social Connections: Not on file   Additional Social History:                         Sleep: Per above  Appetite:  Per above  Current Medications: Current Facility-Administered Medications  Medication Dose Route Frequency Provider Last Rate Last Admin   acetaminophen (TYLENOL) tablet 650 mg  650 mg Oral Q6H PRN  Starkes-Perry, Gayland Curry, FNP       alum & mag hydroxide-simeth (MAALOX/MYLANTA) 200-200-20 MG/5ML suspension 30 mL  30 mL Oral Q4H PRN Suella Broad, FNP       [START ON 01/18/2021] amantadine (SYMMETREL) capsule 100 mg  100 mg Oral Daily Merrily Brittle, DO       dapagliflozin propanediol (FARXIGA) tablet 10 mg  10 mg Oral Daily Suella Broad, FNP   10 mg at 01/17/21 0814   divalproex (DEPAKOTE) DR tablet 1,000 mg  1,000 mg Oral BID Suella Broad, FNP   1,000 mg at 01/17/21 0813   docusate sodium (COLACE) capsule 100 mg  100 mg Oral BID Massengill, Ovid Curd, MD   100 mg at 01/17/21 0813   insulin aspart (novoLOG) injection 0-9 Units  0-9 Units Subcutaneous TID WC Harlow Asa, MD   2 Units at 01/17/21 [id number]   lacosamide (VIMPAT) tablet 150 mg  150 mg Oral BID Merrily Brittle, DO   150 mg at 01/17/21 [id number]   levETIRAcetam (KEPPRA) tablet 500 mg  500 mg Oral BID Suella Broad, FNP   500 mg at 01/17/21 0814   OLANZapine zydis (ZYPREXA) disintegrating tablet 5 mg  5 mg Oral Q8H PRN Massengill, Ovid Curd, MD       And   LORazepam (ATIVAN) tablet 1 mg  1 mg Oral PRN Massengill, Ovid Curd, MD       And   ziprasidone (GEODON) injection 20 mg  20 mg Intramuscular PRN Massengill, Ovid Curd, MD       magnesium hydroxide (MILK OF MAGNESIA) suspension 30 mL  30 mL Oral Daily PRN Suella Broad, FNP       melatonin tablet 3 mg  3 mg Oral QHS Merrily Brittle, DO       nicotine (NICODERM CQ - dosed in mg/24 hours) patch 14 mg  14 mg Transdermal Daily Suella Broad, FNP   14 mg at 01/16/21 0900   OLANZapine zydis (ZYPREXA) disintegrating tablet 15 mg  15 mg Oral QHS Merrily Brittle, DO       OLANZapine zydis (ZYPREXA) disintegrating tablet 5 mg  5 mg Oral Daily Merrily Brittle, DO   5 mg at 01/17/21 1249   rosuvastatin (CRESTOR) tablet 20 mg  20 mg Oral QPM Merrily Brittle, DO   20 mg at 01/16/21 1737    Lab Results:  Results for orders placed or performed during the hospital  encounter of 01/11/21 (from the past 48 hour(s))  Glucose, capillary     Status: Abnormal   Collection Time: 01/15/21  5:15 PM  Result Value Ref Range   Glucose-Capillary 261 (H) 70 - 99 mg/dL    Comment: Glucose reference range applies only to samples taken after fasting for at least 8 hours.  Glucose, capillary     Status: Abnormal   Collection Time: 01/15/21  8:33 PM  Result Value  Ref Range   Glucose-Capillary 287 (H) 70 - 99 mg/dL    Comment: Glucose reference range applies only to samples taken after fasting for at least 8 hours.  Glucose, capillary     Status: Abnormal   Collection Time: 01/16/21  5:33 AM  Result Value Ref Range   Glucose-Capillary 155 (H) 70 - 99 mg/dL    Comment: Glucose reference range applies only to samples taken after fasting for at least 8 hours.   Comment 1 Notify RN   Glucose, capillary     Status: Abnormal   Collection Time: 01/16/21 11:40 AM  Result Value Ref Range   Glucose-Capillary 154 (H) 70 - 99 mg/dL    Comment: Glucose reference range applies only to samples taken after fasting for at least 8 hours.   Comment 1 Notify RN    Comment 2 Document in Chart   Glucose, capillary     Status: Abnormal   Collection Time: 01/16/21  5:18 PM  Result Value Ref Range   Glucose-Capillary 200 (H) 70 - 99 mg/dL    Comment: Glucose reference range applies only to samples taken after fasting for at least 8 hours.  Glucose, capillary     Status: Abnormal   Collection Time: 01/16/21  7:23 PM  Result Value Ref Range   Glucose-Capillary 256 (H) 70 - 99 mg/dL    Comment: Glucose reference range applies only to samples taken after fasting for at least 8 hours.  Glucose, capillary     Status: Abnormal   Collection Time: 01/17/21  5:32 AM  Result Value Ref Range   Glucose-Capillary 171 (H) 70 - 99 mg/dL    Comment: Glucose reference range applies only to samples taken after fasting for at least 8 hours.  Glucose, capillary     Status: Abnormal   Collection Time:  01/17/21 11:50 AM  Result Value Ref Range   Glucose-Capillary 109 (H) 70 - 99 mg/dL    Comment: Glucose reference range applies only to samples taken after fasting for at least 8 hours.    Blood Alcohol level:  Lab Results  Component Value Date   ETH <10 01/08/2021   ETH <10 [id number]    Metabolic Disorder Labs: Lab Results  Component Value Date   HGBA1C 5.8 (H) 01/11/2021   MPG 119.76 01/11/2021   MPG 131.24 08/16/2020   No results found for: PROLACTIN Lab Results  Component Value Date   CHOL 152 01/11/2021   TRIG 130 01/11/2021   HDL 50 01/11/2021   CHOLHDL 3.0 01/11/2021   VLDL 26 01/11/2021   LDLCALC 76 01/11/2021    Physical Findings: AIMS:  Facial and Oral Movements Muscles of Facial Expression: None, normal Lips and Perioral Area: Minimal Jaw: None, normal Tongue: None, normal, Extremity Movements Upper (arms, wrists, hands, fingers): None, normal Lower (legs, knees, ankles, toes): None, normal,  Trunk Movements Neck, shoulders, hips: None, normal,  Overall Severity Severity of abnormal movements (highest score from questions above): None, normal Incapacitation due to abnormal movements: None, normal Patient's awareness of abnormal movements (rate only patient's report): No Awareness,  Dental Status Current problems with teeth and/or dentures?: No Does patient usually wear dentures?: No  CIWA:    COWS:     Musculoskeletal: Strength & Muscle Tone: within normal limits Gait & Station: Steady Patient leans: N/A  Psychiatric Specialty Exam:  Presentation  General Appearance: Disheveled; Casual   Eye Contact:Minimal   Speech:Normal Rate (At times mumbling)   Speech Volume:Normal   Handedness:Right  Mood and Affect  Mood:-- (Guarded, paranoid)   Affect:Congruent; Constricted; Appropriate    Thought Process  Thought Processes:Disorganized; Goal Directed   Descriptions of Associations:Loose   Orientation:Partial (Alert and  oriented to self, hospital)   Thought Content:Delusions; Illogical; Paranoid Ideation   History of Schizophrenia/Schizoaffective disorder:Yes   Duration of Psychotic Symptoms:Greater than six months  Hallucinations:Hallucinations: None   Ideas of Reference:None   Suicidal Thoughts:Suicidal Thoughts: No   Homicidal Thoughts:Homicidal Thoughts: No    Sensorium  Memory:Immediate Fair; Recent Poor; Remote Poor   Judgment:Impaired   Insight:Lacking    Executive Functions  Concentration:Poor   Attention Span:Fair   [place]    Psychomotor Activity  Psychomotor Activity:Psychomotor Activity: Tremor   Assets  Assets:Leisure Time    Sleep  Sleep: 7.5 hours     Physical Exam: Physical Exam Vitals and nursing note reviewed.  Constitutional:      General: He is awake. He is not in acute distress.    Appearance: He is not ill-appearing or diaphoretic.  HENT:     Head: Normocephalic.  Pulmonary:     Effort: Pulmonary effort is normal.  Neurological:     General: No focal deficit present.     Mental Status: He is alert. He is disoriented.     Motor: Tremor present.  Psychiatric:        Behavior: Behavior is cooperative.   Review of Systems  Respiratory:  Negative for shortness of breath.   Cardiovascular:  Negative for chest pain.  Gastrointestinal:  Negative for nausea and vomiting.  Neurological:  Negative for dizziness and headaches.  Blood pressure 114/85, pulse 100, temperature 98.4 F (36.9 C), resp. rate 16, height 6\' 2"  (1.88 m), weight 91.6 kg, SpO2 97 %. Body mass index is 25.94 kg/m.   Treatment Plan & Assessment Summary: Principal Problem:   Schizophrenia ([place])   Daniel Foley is a 51 y.o. male with PPHx of schizophrenia, seizures (on Keppra, Depakote, lacosamide) living at a boarding home who presented involuntarily via [employer] after attempting to walk into heavy traffic and was  found to be psychotic and responding internally, now admitted for management of acute psychosis exacerbated by recent cocaine use.  [place] Day 6   PLAN: Schizophrenia by history, considering possible delirium After consolidating zyprexa to PM only, patient's thought process was more disorganized than yesterday.  Patient denied AVH. 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 Unclear etiology, possibly 2/2 amantadine vs. VPA or both. AIMS 1 for perioral movements.  Holding home Austedo at this time Monitor with AIMS and tremors Will consider restarting home Cogentin 0.5 mg daily for EPS Continued amantadine 100 mg daily  Dementia vs. cognitive impairment Suspected multifactorial, considering vascular vs. Lewy body vs. mixed.  Presentation currently not consistent with Parkinson's or Alzheimer's. Immediate memory intact, 3-minute recall impaired. Patient was not able to remember where he came from prior to hospitalization.  Modifiable risk factors management per below  Other medical conditions: Seizure disorder: Continued home Depakote DR 1000 mg BID, lacosamide 150 mg BID, Keppra 500 mg BID.  VPA level returned now therapeutic. HLD: Continued home rosuvastatin 20 mg qHS T2DM: Continued home Farxiga 10 mg daily, did not restart home semaglutide; FSBS AC/HS with SSI ordered H/o  CKD?:  Held home finerenone H/o COPD?: Held home Symbicort, Spiriva Respimat  Dispo: Pending as patient does not want to return to the boardinghouse. Stated that his niece "Na-Na" lives in [place], but he has no way to contact her. Stated that he has no other family anywhere.  We will work with CSW to assess if patient qualifies for ACTT Continued to attempt reaching caregiver  to assess patient's baseline  Safety, monitoring and disposition planning: The patient was seen and evaluated on the unit.  The patient's chart was reviewed and nursing notes were reviewed.  The patient's case was discussed in multidisciplinary team meeting.  Plan and drug side effects were discussed with patient who was amendable. Social work and case management to assist with discharge planning and identification of hospital follow-up needs prior to discharge. Discharge Concerns: Need to establish a safety plan; Medication compliance and effectiveness Discharge Goals: Return home with outpatient referrals for mental health follow-up including medication management/psychotherapy Short Term Goals: Ability to identify triggers associated with substance abuse/mental health issues will improve Long Term Goals: Improvement in symptoms so as ready for discharge Safety and Monitoring: Involuntary admission to inpatient psychiatric unit for safety, stabilization and treatment Daily contact with patient to assess and evaluate symptoms and progress in treatment and medical management Patient's case to be discussed in multi-disciplinary team meeting Observation Level : q15 minute checks Vital signs:  q12 hours Precautions: suicide I certify that inpatient services furnished can reasonably be expected to improve the patient's condition.    Signed: Merrily Brittle, DO Psychiatry Resident, PGY-1 [facility] 01/17/2021, 1:26 PM

## 2021-01-17 NOTE — BHH Counselor (Signed)
CSW left voicemail for Campbell Stall [phone]) in regards to this patients care and discharge plans.     Ruthann Cancer MSW, LCSW Clincal Social Worker  [facility]

## 2021-01-17 NOTE — B[facility] Group Notes (Signed)
Adult Psychoeducational Group Note  Date:  01/17/2021 Time:  9:26 AM  Group Topic/Focus:  Goals Group:   The focus of this group is to help patients establish daily goals to achieve during treatment and discuss how the patient can incorporate goal setting into their daily lives to aide in recovery.  Participation Level:  Did Not Attend   Donell Beers 01/17/2021, 9:26 AM

## 2021-01-18 ENCOUNTER — Encounter (HOSPITAL_COMMUNITY): Payer: Self-pay | Admitting: Emergency Medicine

## 2021-01-18 DIAGNOSIS — E785 Hyperlipidemia, unspecified: Secondary | ICD-10-CM | POA: Insufficient documentation

## 2021-01-18 DIAGNOSIS — F05 Delirium due to known physiological condition: Secondary | ICD-10-CM

## 2021-01-18 DIAGNOSIS — G2119 Other drug induced secondary parkinsonism: Secondary | ICD-10-CM

## 2021-01-18 DIAGNOSIS — N62 Hypertrophy of breast: Secondary | ICD-10-CM | POA: Insufficient documentation

## 2021-01-18 DIAGNOSIS — F03A0 Unspecified dementia, mild, without behavioral disturbance, psychotic disturbance, mood disturbance, and anxiety: Secondary | ICD-10-CM

## 2021-01-18 LAB — GLUCOSE, CAPILLARY
Glucose-Capillary: 144 mg/dL — ABNORMAL HIGH (ref 70–99)
Glucose-Capillary: 177 mg/dL — ABNORMAL HIGH (ref 70–99)
Glucose-Capillary: 193 mg/dL — ABNORMAL HIGH (ref 70–99)
Glucose-Capillary: 267 mg/dL — ABNORMAL HIGH (ref 70–99)

## 2021-01-18 NOTE — Progress Notes (Addendum)
[facility] MD Progress Note  01/18/2021 1:39 PM Daniel Foley  MRN: [id number] CC: Psychosis  Subjective: Daniel Foley is a 51 y.o. male with PPHx of schizophrenia, seizures (on Keppra, Depakote, lacosamide) living at a boarding home who presented involuntarily via [employer] after attempting to walk into heavy traffic and was found to be psychotic and responding internally, now admitted for management of acute psychosis.  Daniel Foley 7   Yesterday's recommendation by the Psychiatry team Change Zyprexa from 20 mg qhs to 5mg  qam and 15mg  qhs   Melatonin 3 mg qHS Decreased amantadine to 100 mg daily   Interim History: Pt is less psychotic today. Thoughts are less disorganized. He reports feeling less paranoid. He denies AH and VH but per nurse, pt was RTIS and speaking to himself during med pass.  He stated that he knew the hospital, however he was unable to tell us what state, city, reason for hospitalization, or year.  However after reorientation, patient was able to recall all of this information.  He reported that his feels tremor feels better, although unable to describe why.  Stated that his sleep, appetite, mood are fine.   In regards to dispo, patient was unable to remember where he came from prior to hospitalization.  However he does remember living at a boardinghouse, that he does not want to return to.  Stated that the "people are all fighting there and I do want to go back there".  However he denied family pt lives with.  Stated that his sister (days prior he stated that it was his niece) visited him a few weeks ago, although he has no way of contacting her.  Stated that he does not know where he can go once he is out of the hospital. Patient denied SI/HI. Patient was not grossly responding to internal/external stimuli nor made any delusional statements during encounter.   Diagnosis:  Principal Problem:   Schizophrenia ([place]) Active Problems:   Drug-induced parkinsonism (HCC)   Unspecified  dementia, mild, without behavioral disturbance, psychotic disturbance, mood disturbance, and anxiety   Gynecomastia, male   Controlled type 2 diabetes mellitus with hypoglycemia ([place])   Hyperlipidemia  Total Time spent with patient:  I personally spent 30 minutes on the unit in direct patient care. The direct patient care time included face-to-face time with the patient, reviewing the patient's chart, communicating with other professionals, and coordinating care. Greater than 50% of this time was spent in counseling or coordinating care with the patient regarding goals of hospitalization, psycho-education, and discharge planning needs.   Past Psychiatric History: See H&P  Past Medical History:  Past Medical History:  Diagnosis Date   Chest pain 08/15/2020   Chronic kidney disease    Diabetes mellitus without complication ([place])    Neuromuscular disorder ([place])    Observed seizure-like activity ([place]) 06/22/2020   Schizophrenia ([place])    Witnessed seizure-like activity ([place]) 06/21/2020   History reviewed. No pertinent surgical history. Family History:  Family History  Problem Relation Age of Onset   Seizures Brother    Family Psychiatric  History: See H&P Social History:  Social History   Substance and Sexual Activity  Alcohol Use Yes     Social History   Substance and Sexual Activity  Drug Use Not Currently    Social History   Socioeconomic History   Marital status: Unknown    Spouse name: Not on file   Number of children: Not on file   Years of education: Not on file  Highest education level: Not on file  Occupational History   Not on file  Tobacco Use   Smoking status: Every Foley    Packs/Foley: 0.25    Years: 0.50    Pack years: 0.13    Types: Cigarettes   Smokeless tobacco: Never  Vaping Use   Vaping Use: Never used  Substance and Sexual Activity   Alcohol use: Yes   Drug use: Not Currently   Sexual activity: Not Currently    Birth control/protection: Condom   Other Topics Concern   Not on file  Social History Narrative   Not on file   Social Determinants of Health   Financial Resource Strain: Not on file  Food Insecurity: Not on file  Transportation Needs: Not on file  Physical Activity: Not on file  Stress: Not on file  Social Connections: Not on file   Additional Social History:                         Sleep: Per above  Appetite:  Per above  Current Medications: Current Facility-Administered Medications  Medication Dose Route Frequency Provider Last Rate Last Admin   acetaminophen (TYLENOL) tablet 650 mg  650 mg Oral Q6H PRN Starkes-Perry, Gayland Curry, FNP       alum & mag hydroxide-simeth (MAALOX/MYLANTA) 200-200-20 MG/5ML suspension 30 mL  30 mL Oral Q4H PRN Starkes-Perry, Gayland Curry, FNP       amantadine (SYMMETREL) capsule 100 mg  100 mg Oral Daily Merrily Brittle, DO   100 mg at 01/18/21 0800   dapagliflozin propanediol (FARXIGA) tablet 10 mg  10 mg Oral Daily Suella Broad, FNP   10 mg at 01/18/21 0800   divalproex (DEPAKOTE) DR tablet 1,000 mg  1,000 mg Oral BID Suella Broad, FNP   1,000 mg at 01/18/21 0800   docusate sodium (COLACE) capsule 100 mg  100 mg Oral BID [employer], Ovid Curd, MD   100 mg at 01/18/21 0800   insulin aspart (novoLOG) injection 0-9 Units  0-9 Units Subcutaneous TID WC Harlow Asa, MD   2 Units at 01/18/21 1209   lacosamide (VIMPAT) tablet 150 mg  150 mg Oral BID Merrily Brittle, DO   150 mg at 01/18/21 0759   levETIRAcetam (KEPPRA) tablet 500 mg  500 mg Oral BID Suella Broad, FNP   500 mg at 01/18/21 0800   OLANZapine zydis (ZYPREXA) disintegrating tablet 5 mg  5 mg Oral Q8H PRN Dayson Aboud, Ovid Curd, MD       And   LORazepam (ATIVAN) tablet 1 mg  1 mg Oral PRN Arkel Cartwright, Ovid Curd, MD       And   ziprasidone (GEODON) injection 20 mg  20 mg Intramuscular PRN Daelyn Mozer, Ovid Curd, MD       magnesium hydroxide (MILK OF MAGNESIA) suspension 30 mL  30 mL Oral Daily PRN  Starkes-Perry, Gayland Curry, FNP       melatonin tablet 3 mg  3 mg Oral QHS Merrily Brittle, DO   3 mg at 01/17/21 2045   nicotine (NICODERM CQ - dosed in mg/24 hours) patch 14 mg  14 mg Transdermal Daily Suella Broad, FNP   14 mg at 01/16/21 0900   OLANZapine zydis (ZYPREXA) disintegrating tablet 15 mg  15 mg Oral QHS Merrily Brittle, DO   15 mg at 01/17/21 2045   OLANZapine zydis (ZYPREXA) disintegrating tablet 5 mg  5 mg Oral Daily Merrily Brittle, DO   5 mg at 01/18/21 0800  rosuvastatin (CRESTOR) tablet 20 mg  20 mg Oral QPM Merrily Brittle, DO   20 mg at 01/17/21 1712    Lab Results:  Results for orders placed or performed during the hospital encounter of 01/11/21 (from the past 48 hour(s))  Glucose, capillary     Status: Abnormal   Collection Time: 01/16/21  5:18 PM  Result Value Ref Range   Glucose-Capillary 200 (H) 70 - 99 mg/dL    Comment: Glucose reference range applies only to samples taken after fasting for at least 8 hours.  Glucose, capillary     Status: Abnormal   Collection Time: 01/16/21  7:23 PM  Result Value Ref Range   Glucose-Capillary 256 (H) 70 - 99 mg/dL    Comment: Glucose reference range applies only to samples taken after fasting for at least 8 hours.  Glucose, capillary     Status: Abnormal   Collection Time: 01/17/21  5:32 AM  Result Value Ref Range   Glucose-Capillary 171 (H) 70 - 99 mg/dL    Comment: Glucose reference range applies only to samples taken after fasting for at least 8 hours.  Glucose, capillary     Status: Abnormal   Collection Time: 01/17/21 11:50 AM  Result Value Ref Range   Glucose-Capillary 109 (H) 70 - 99 mg/dL    Comment: Glucose reference range applies only to samples taken after fasting for at least 8 hours.  Glucose, capillary     Status: Abnormal   Collection Time: 01/17/21  5:16 PM  Result Value Ref Range   Glucose-Capillary 210 (H) 70 - 99 mg/dL    Comment: Glucose reference range applies only to samples taken after fasting for  at least 8 hours.  Glucose, capillary     Status: Abnormal   Collection Time: 01/17/21  8:02 PM  Result Value Ref Range   Glucose-Capillary 307 (H) 70 - 99 mg/dL    Comment: Glucose reference range applies only to samples taken after fasting for at least 8 hours.   Comment 1 Notify RN   Glucose, capillary     Status: Abnormal   Collection Time: 01/18/21  5:48 AM  Result Value Ref Range   Glucose-Capillary 144 (H) 70 - 99 mg/dL    Comment: Glucose reference range applies only to samples taken after fasting for at least 8 hours.  Glucose, capillary     Status: Abnormal   Collection Time: 01/18/21 11:51 AM  Result Value Ref Range   Glucose-Capillary 177 (H) 70 - 99 mg/dL    Comment: Glucose reference range applies only to samples taken after fasting for at least 8 hours.    Blood Alcohol level:  Lab Results  Component Value Date   ETH <10 01/08/2021   ETH <10 [id number]    Metabolic Disorder Labs: Lab Results  Component Value Date   HGBA1C 5.8 (H) 01/11/2021   MPG 119.76 01/11/2021   MPG 131.24 08/16/2020   No results found for: PROLACTIN Lab Results  Component Value Date   CHOL 152 01/11/2021   TRIG 130 01/11/2021   HDL 50 01/11/2021   CHOLHDL 3.0 01/11/2021   VLDL 26 01/11/2021   LDLCALC 76 01/11/2021    Physical Findings: AIMS:  Facial and Oral Movements Muscles of Facial Expression: None, normal Lips and Perioral Area: Minimal Jaw: None, normal Tongue: None, normal, Extremity Movements Upper (arms, wrists, hands, fingers): None, normal Lower (legs, knees, ankles, toes): None, normal,  Trunk Movements Neck, shoulders, hips: None, normal,  Overall Severity Severity of  abnormal movements (highest score from questions above): None, normal Incapacitation due to abnormal movements: None, normal Patient's awareness of abnormal movements (rate only patient's report): No Awareness,  Dental Status Current problems with teeth and/or dentures?: Yes Does patient  usually wear dentures?: No  CIWA:    COWS:     Musculoskeletal: Strength & Muscle Tone: within normal limits Gait & Station: Steady Patient leans: N/A  Psychiatric Specialty Exam:  Presentation  General Appearance: Disheveled   Eye Contact:Minimal   Speech:Normal Rate; Clear and Coherent (At times mumbling)   Speech Volume:Decreased   Handedness:Right    Mood and Affect  Mood:euthymic   Affect: Constricted    Thought Process  Thought Processes:Goal Directed; Linear; Disorganized   Descriptions of Associations:Circumstantial   Orientation:Partial (To self only)   Thought Content:Paranoid Ideation; Perseveration; Rumination   History of Schizophrenia/Schizoaffective disorder:Yes   Duration of Psychotic Symptoms:Greater than six months  Hallucinations:Hallucinations: None   Ideas of Reference:None   Suicidal Thoughts:Suicidal Thoughts: No   Homicidal Thoughts:Homicidal Thoughts: No    Sensorium  Memory:Immediate Good; Recent Poor; Remote Poor   Judgment:Impaired   Insight:Lacking    Executive Functions  Concentration:Fair   Attention Span:Fair   Recall:Poor   Fund of Knowledge:Fair   Language:Fair    Psychomotor Activity  Psychomotor Activity:Psychomotor Activity: Tremor   Assets  Assets:Leisure Time    Sleep  Sleep: 8.25 hours     Physical Exam: Physical Exam Vitals and nursing note reviewed.  Constitutional:      General: He is awake. He is not in acute distress.    Appearance: He is not ill-appearing or diaphoretic.  HENT:     Head: Normocephalic.  Pulmonary:     Effort: Pulmonary effort is normal.  Neurological:     General: No focal deficit present.     Mental Status: He is alert. He is disoriented.     Motor: Tremor present.  Psychiatric:        Behavior: Behavior is cooperative.   Review of Systems  Respiratory:  Negative for shortness of breath.   Cardiovascular:  Negative for chest  pain.  Gastrointestinal:  Negative for nausea and vomiting.  Neurological:  Negative for dizziness and headaches.   Blood pressure 110/77, pulse 97, temperature 97.6 F (36.4 C), temperature source Oral, resp. rate 16, height 6\' 2"  (1.88 m), weight 91.6 kg, SpO2 100 %. Body mass index is 25.94 kg/m.   Treatment Plan & Assessment Summary: Principal Problem:   Schizophrenia ([place]) Active Problems:   Drug-induced parkinsonism ([place])   Unspecified dementia, mild, without behavioral disturbance, psychotic disturbance, mood disturbance, and anxiety   Gynecomastia, male   Controlled type 2 diabetes mellitus with hypoglycemia ([place])   Hyperlipidemia   Daniel Foley is a 51 y.o. male with PPHx of schizophrenia, seizures (on Keppra, Depakote, lacosamide) living at a boarding home who presented involuntarily via [employer] after attempting to walk into heavy traffic and was found to be psychotic and responding internally, now admitted for management of acute psychosis exacerbated by recent cocaine use.  [place] Foley 7   PLAN: Schizophrenia by history, considering possible delirium RN noted the patient was internally preoccupied this a.m., however on counter he denied AVH or any first rank symptoms.  His psychosis has improved since yesterday and possibly nearing his baseline, will not make any med changes Delirium precautions, bowel regimen, melatonin 3 mg qHS Consider LAI once stabilized Continued Zyprexa 5mg  qAM and 15mg  qHS   Suspected drug induced parkinsonism and R/o [place]  dyskinesia:  Tremors are stable, and markedly improved from admission. Unclear etiology, possibly 2/2 amantadine vs. VPA or both. AIMS 1 for perioral movements.  Held home [place] Monitor with AIMS and tremors Continued amantadine 100 mg daily  Order PRL level for Saturday morning 2/2 gynecomastia   Dementia vs. cognitive impairment Suspected multifactorial considering his history of recurrent seizures, substance abuse,  diabetes, hyperlipidemia.  His memory was improved since yesterday, as he was able to recall after a few minutes.  Unclear, however this may be patient's baseline. Modifiable risk factors management per below  Other medical conditions: Seizure disorder: Continued home Depakote DR 1000 mg BID, lacosamide 150 mg BID, Keppra 500 mg BID.  VPA level returned now therapeutic. HLD: Continued home rosuvastatin 20 mg qHS T2DM: Continued home Farxiga 10 mg daily, did not restart home semaglutide; FSBS AC/HS with SSI ordered H/o CKD?:  Held home finerenone H/o COPD?: Held home Symbicort, Spiriva Respimat  Dispo: Pending as patient does not want to return to the boardinghouse. Stated that his niece/sister "Na-Na" lives in [place], but he has no way to contact her. Stated that he has no other family anywhere.  We will work with CSW to assess if patient qualifies for ACTT Continued to attempt reaching caregiver to assess patient's baseline  Safety, monitoring and disposition planning: The patient was seen and evaluated on the unit.  The patient's chart was reviewed and nursing notes were reviewed.  The patient's case was discussed in multidisciplinary team meeting.  Plan and drug side effects were discussed with patient who was amendable. Social work and case management to assist with discharge planning and identification of hospital follow-up needs prior to discharge. Discharge Concerns: Need to establish a safety plan; Medication compliance and effectiveness Discharge Goals: Return home with outpatient referrals for mental health follow-up including medication management/psychotherapy Short Term Goals: Ability to identify triggers associated with substance abuse/mental health issues will improve Long Term Goals: Improvement in symptoms so as ready for discharge Safety and Monitoring: Involuntary admission to inpatient psychiatric unit for safety, stabilization and treatment Daily contact with  patient to assess and evaluate symptoms and progress in treatment and medical management Patient's case to be discussed in multi-disciplinary team meeting Observation Level : q15 minute checks Vital signs:  q12 hours Precautions: suicide I certify that inpatient services furnished can reasonably be expected to improve the patient's condition.    Signed: Merrily Brittle, DO Psychiatry Resident, PGY-1 [facility] 01/18/2021, 1:39 PM    Total Time Spent in Direct Patient Care:  I personally spent 30 minutes on the unit in direct patient care. The direct patient care time included face-to-face time with the patient, reviewing the patient's chart, communicating with other professionals, and coordinating care. Greater than 50% of this time was spent in counseling or coordinating care with the patient regarding goals of hospitalization, psycho-education, and discharge planning needs.  I have independently evaluated the patient during a face-to-face assessment on 01/18/21. I reviewed the patient's chart, and I participated in key portions of the service. I discussed the case with the [employer], and I agree with the assessment and plan of care as documented in the House Officer's note, as addended by me or notated below:  I directly edited the note, as above.   Janine Limbo, MD Psychiatrist

## 2021-01-18 NOTE — Progress Notes (Signed)
°   01/18/21 2000  Psych Admission Type (Psych Patients Only)  Admission Status Involuntary  Psychosocial Assessment  Patient Complaints Worrying  Eye Contact Fair  Facial Expression Flat  Affect Flat  Speech Soft;Slow;Logical/coherent  Interaction Isolative;Guarded  Motor Activity Other (Comment) (WDL)  Appearance/Hygiene In scrubs  Behavior Characteristics Cooperative  Mood Suspicious;Preoccupied  Thought Process  Coherency Circumstantial  Content Blaming others  Delusions None reported or observed  Perception Hallucinations  Hallucination Auditory;Visual  Judgment Limited  Confusion None  Danger to Self  Current suicidal ideation? Denies  Danger to Others  Danger to Others None reported or observed

## 2021-01-18 NOTE — Progress Notes (Signed)
°   01/18/21 1200  Psych Admission Type (Psych Patients Only)  Admission Status Involuntary  Psychosocial Assessment  Patient Complaints None  Eye Contact Fair  Facial Expression Flat  Affect Flat  Speech Soft;Slow;Logical/coherent  Interaction Isolative;Guarded  Motor Activity Other (Comment) (WDL)  Appearance/Hygiene In scrubs  Behavior Characteristics Cooperative  Mood Preoccupied  Thought Process  Coherency Circumstantial  Content Blaming others  Delusions None reported or observed  Perception Hallucinations  Hallucination Auditory;Visual  Judgment Limited  Confusion None  Danger to Self  Current suicidal ideation? Denies  Danger to Others  Danger to Others None reported or observed

## 2021-01-18 NOTE — Group Note (Signed)
Recreation Therapy Group Note   Group Topic:Team Building  Group Date: 01/18/2021 Start Time: 1000 End Time: 1020 Facilitators: Caroll Rancher, LRT,CTRS Location: [address] Dayroom   Goal Area(s) Addresses:  Patient will effectively work with peer towards shared goal.  Patient will identify skills used to make activity successful.  Patient will identify how skills used during activity can be used to reach post d/c goals.    Group Description:  Landing Pad. In teams of 3-5, patients were given 12 plastic drinking straws and an equal length of masking tape. Using the materials provided, patients were asked to build a landing pad to catch a golf ball dropped from approximately 5 feet in the air. All materials were required to be used by the team in their design. LRT facilitated post-activity discussion.    Affect/Mood: N/A   Participation Level: Did not attend    Clinical Observations/Individualized Feedback:     Plan: Continue to engage patient in RT group sessions 2-3x/week.   Caroll Rancher, LRT,CTRS 01/18/2021 11:55 AM

## 2021-01-18 NOTE — Group Note (Signed)
LCSW Group Therapy Note ° ° °Group Date: 01/18/2021 °Start Time: 1100 °End Time: 1200 ° °Type of Therapy and Topic:  Group Therapy:  Stress Management °  °Participation Level:  Did Not Attend  °  °Description of Group:  Patients in this group were introduced to the idea of stress and encouraged to discuss negative and positive ways to manage stress. Patients discussed specific stressors that they have in their life right now and the physical signs and symptoms associated with that stress.  Patient encouraged to come up with positive changes to assist with the stress upon discharge in order to prevent future hospitalizations.   They also worked as a group on developing a specific plan for several patients to deal with stressors through boundary-setting, psychoeducation and self care techniques °  °Therapeutic Goals: °  °            1)  To discuss the positive and negative impacts of stress °            2)  identify signs and symptoms of stress °            3)  generate ideas for stress management °            4)  offer mutual support to others regarding stress management °            5)  Developing plans for ways to manage specific stressors upon discharge °            °  °Summary of Patient Progress:  Did not attend °  °Therapeutic Modalities:   °Motivational Interviewing °Brief Solution-Focused Therapy ° °

## 2021-01-18 NOTE — Progress Notes (Signed)
Inpatient Diabetes Program Recommendations  AACE/ADA: New Consensus Statement on Inpatient Glycemic Control (2015)  Target Ranges:  Prepandial:   less than 140 mg/dL      Peak postprandial:   less than 180 mg/dL (1-2 hours)      Critically ill patients:  140 - 180 mg/dL   Lab Results  Component Value Date   GLUCAP 144 (H) 01/18/2021   HGBA1C 5.8 (H) 01/11/2021    Review of Glycemic Control  Diabetes history: DM2 Outpatient Diabetes medications: Farxiga 10 QD, Rybelsus 7 mg QD Current orders for Inpatient glycemic control: Novolog 0-9 units TID with meals, Farxiga 10 QD  HgbA1C - 5.8%  Inpatient Diabetes Program Recommendations:   If CBG before dinner meal continues to be above goal of 140-180 mg/dL, consider adding Novolog 2 units meal coverage before the dinner meal. (Will also get Novolog correction 0-9 TID)  Continue to follow trends.  Thank you. Ailene Ards, RD, LDN, CDE Inpatient Diabetes Coordinator [phone]

## 2021-01-18 NOTE — [facility] Group Notes (Signed)
Spirituality group facilitated by Kathleen Argue, BCC.   Group Description: Group focused on topic of hope. Patients participated in facilitated discussion around topic, connecting with one another around experiences and definitions for hope. Group members engaged with visual explorer photos, reflecting on what hope looks like for them today. Group engaged in discussion around how their definitions of hope are present today in hospital.   Modalities: Psycho-social ed, Adlerian, Narrative, MI   Patient Progress: Daniel Foley attended group and was engaged at times in conversation. He reported gratitude for daily life as his source of hope.  Chaplain Dyanne Carrel, Bcc Pager, [phone] 2:10 PM

## 2021-01-18 NOTE — Progress Notes (Signed)
°   01/18/21 0545  Sleep  Number of Hours 8.25

## 2021-01-19 DIAGNOSIS — F200 Paranoid schizophrenia: Secondary | ICD-10-CM | POA: Diagnosis not present

## 2021-01-19 DIAGNOSIS — N62 Hypertrophy of breast: Secondary | ICD-10-CM | POA: Diagnosis not present

## 2021-01-19 LAB — GLUCOSE, CAPILLARY
Glucose-Capillary: 150 mg/dL — ABNORMAL HIGH (ref 70–99)
Glucose-Capillary: 166 mg/dL — ABNORMAL HIGH (ref 70–99)
Glucose-Capillary: 230 mg/dL — ABNORMAL HIGH (ref 70–99)
Glucose-Capillary: 211 mg/dL — ABNORMAL HIGH (ref 70–99)

## 2021-01-19 NOTE — B[facility] Group Notes (Signed)
Adult Psychoeducational Group Note  Date:  01/19/2021 Time:  11:43 PM  Group Topic/Focus:  Goals Group:   The focus of this group is to help patients establish daily goals to achieve during treatment and discuss how the patient can incorporate goal setting into their daily lives to aide in recovery.  Participation Level:  Active  Participation Quality:  Attentive  Affect:  Appropriate  Cognitive:  Appropriate  Insight: Appropriate  Engagement in Group:  Improving  Modes of Intervention:  Discussion  Additional Comments  Jacalyn Lefevre 01/19/2021, 11:43 PM

## 2021-01-19 NOTE — Progress Notes (Signed)
°   01/19/21 0545  Sleep  Number of Hours 7.75

## 2021-01-19 NOTE — Group Note (Signed)
LCSW Group Therapy Note 01/20/2021  11:15am-12:00pm  Type of Therapy and Topic:  Group Therapy: Anger and Commonalities  Participation Level:  Minimal   Description of Group: In this group, patients initially shared an "unknown" fact about themselves and CSW led a discussion about the ways in which we have things in common without realizing it.  Patient then identified a recent time they became angry and how this yet again showed a way in which they had something in common with other patients.  We discussed possible unhealthy reactions to anger and possible healthy reactions.  We also discussed possible underlying emotions that lead to the anger.  Commonalities among group members were pointed out throughout the entirety of group.  Therapeutic Goals: Patients were asked to share something about themselves and learned that they often have things in common with other people without knowing this Patients will remember an incident of anger and how they reacted Patients will be able to identify their reaction as healthy or unhealthy, and identify possible reactions that would have been the opposite Patients will learn that anger itself is a secondary emotion and will think about their primary emotion at the time of their last incident of anger  Summary of Patient Progress:  The patient shared that something interesting he could share about himself is that he has been nice for 52 years.  The patient actually is only 51 years old. The patient's participation was minimal.  At one point he did say he got hooked on [place] cigarettes and that is "probably why I'm here."  Therapeutic Modalities:   Cognitive Behavioral Therapy  Lynnell Chad, LCSW 01/20/2021  11:09 AM

## 2021-01-19 NOTE — Progress Notes (Addendum)
[facility] MD Progress Note  01/19/2021 2:05 PM Daniel Foley  MRN: [id number] CC: Psychosis  Subjective: Daniel Foley is a 51 y.o. male with PPHx of schizophrenia, seizures (on Keppra, Depakote, lacosamide) living at a boarding home who presented involuntarily via [employer] after attempting to walk into heavy traffic and was found to be psychotic and responding internally, now admitted for management of acute psychosis.  [place] Day 8   Yesterday's recommendation by the Psychiatry team No med changes Continue Zyprexa 5mg  qam and 15mg  qhs   Melatonin 3 mg qHS Decreased amantadine to 100 mg daily  Interim History: On interview and assessment this morning, the patient exhibits a severely disorganized thought process but is not responding to internal stimuli.  He is unable to respond to questioning in a coherent manner.  When asked what his mood is, he reports "nice".  He then proceeds to talk about how he cannot read or write.  When asked about any physical symptoms, talks about "match books".  After this he talks about "backwards semi and shape".  When the plan is discussed he states "push-ups will help get me out".  He refuses to answer orientation questions this morning. The patient denies auditory/visual hallucinations and first rank symptoms.  The patient reports good mood, appetite, and sleep. They deny suicidal and homicidal thoughts. The patient denies side effects from their medications.  Review of systems as below.   On physical examination the patient demonstrates repetitive tongue movements and a high amplitude tremor of both arms and hands that is reinforced with distraction.  Diagnosis:  Principal Problem:   Schizophrenia ([place]) Active Problems:   Controlled type 2 diabetes mellitus with hypoglycemia (HCC)   Gynecomastia, male   Drug-induced parkinsonism ([place])   Hyperlipidemia   Unspecified dementia, mild, without behavioral disturbance, psychotic disturbance, mood disturbance, and  anxiety  Total Time spent with patient:  I personally spent 30 minutes on the unit in direct patient care. The direct patient care time included face-to-face time with the patient, reviewing the patient's chart, communicating with other professionals, and coordinating care. Greater than 50% of this time was spent in counseling or coordinating care with the patient regarding goals of hospitalization, psycho-education, and discharge planning needs.   Past Psychiatric History: See H&P  Past Medical History:  Past Medical History:  Diagnosis Date   Chest pain 08/15/2020   Chronic kidney disease    Diabetes mellitus without complication ([employer])    Neuromuscular disorder ([place])    Observed seizure-like activity ([place]) 06/22/2020   Schizophrenia ([place])    Witnessed seizure-like activity ([place]) 06/21/2020   History reviewed. No pertinent surgical history. Family History:  Family History  Problem Relation Age of Onset   Seizures Brother    Family Psychiatric  History: See H&P Social History:  Social History   Substance and Sexual Activity  Alcohol Use Yes     Social History   Substance and Sexual Activity  Drug Use Not Currently    Social History   Socioeconomic History   Marital status: Unknown    Spouse name: Not on file   Number of children: Not on file   Years of education: Not on file   Highest education level: Not on file  Occupational History   Not on file  Tobacco Use   Smoking status: Every Day    Packs/day: 0.25    Years: 0.50    Pack years: 0.13    Types: Cigarettes   Smokeless tobacco: Never  Vaping Use  Vaping Use: Never used  Substance and Sexual Activity   Alcohol use: Yes   Drug use: Not Currently   Sexual activity: Not Currently    Birth control/protection: Condom  Other Topics Concern   Not on file  Social History Narrative   Not on file   Social Determinants of Health   Financial Resource Strain: Not on file  Food Insecurity: Not on file   Transportation Needs: Not on file  Physical Activity: Not on file  Stress: Not on file  Social Connections: Not on file   Additional Social History:   Sleep: Per above  Appetite:  Per above  Current Medications: Current Facility-Administered Medications  Medication Dose Route Frequency Provider Last Rate Last Admin   acetaminophen (TYLENOL) tablet 650 mg  650 mg Oral Q6H PRN Starkes-Perry, Gayland Curry, FNP       alum & mag hydroxide-simeth (MAALOX/MYLANTA) 200-200-20 MG/5ML suspension 30 mL  30 mL Oral Q4H PRN Starkes-Perry, Gayland Curry, FNP       amantadine (SYMMETREL) capsule 100 mg  100 mg Oral Daily Merrily Brittle, DO   100 mg at 01/19/21 [id number]   dapagliflozin propanediol (FARXIGA) tablet 10 mg  10 mg Oral Daily Suella Broad, FNP   10 mg at 01/19/21 0821   divalproex (DEPAKOTE) DR tablet 1,000 mg  1,000 mg Oral BID Suella Broad, FNP   1,000 mg at 01/19/21 [id number]   docusate sodium (COLACE) capsule 100 mg  100 mg Oral BID Massengill, Ovid Curd, MD   100 mg at 01/19/21 [id number]   insulin aspart (novoLOG) injection 0-9 Units  0-9 Units Subcutaneous TID WC Harlow Asa, MD   2 Units at 01/19/21 1254   lacosamide (VIMPAT) tablet 150 mg  150 mg Oral BID Merrily Brittle, DO   150 mg at 01/19/21 [id number]   levETIRAcetam (KEPPRA) tablet 500 mg  500 mg Oral BID Suella Broad, FNP   500 mg at 01/19/21 [id number]   OLANZapine zydis (ZYPREXA) disintegrating tablet 5 mg  5 mg Oral Q8H PRN Massengill, Ovid Curd, MD       And   LORazepam (ATIVAN) tablet 1 mg  1 mg Oral PRN Massengill, Ovid Curd, MD       And   ziprasidone (GEODON) injection 20 mg  20 mg Intramuscular PRN Massengill, Ovid Curd, MD       magnesium hydroxide (MILK OF MAGNESIA) suspension 30 mL  30 mL Oral Daily PRN Starkes-Perry, Gayland Curry, FNP       melatonin tablet 3 mg  3 mg Oral QHS Merrily Brittle, DO   3 mg at 01/18/21 2039   nicotine (NICODERM CQ - dosed in mg/24 hours) patch 14 mg  14 mg Transdermal Daily Suella Broad, FNP    14 mg at 01/19/21 0821   OLANZapine zydis (ZYPREXA) disintegrating tablet 15 mg  15 mg Oral QHS Merrily Brittle, DO   15 mg at 01/18/21 2039   OLANZapine zydis (ZYPREXA) disintegrating tablet 5 mg  5 mg Oral Daily Merrily Brittle, DO   5 mg at 01/19/21 [id number]   rosuvastatin (CRESTOR) tablet 20 mg  20 mg Oral QPM Merrily Brittle, DO   20 mg at 01/18/21 1657    Lab Results:  Results for orders placed or performed during the hospital encounter of 01/11/21 (from the past 48 hour(s))  Glucose, capillary     Status: Abnormal   Collection Time: 01/17/21  5:16 PM  Result Value Ref Range   Glucose-Capillary 210 (H) 70 - 99 mg/dL  Comment: Glucose reference range applies only to samples taken after fasting for at least 8 hours.  Glucose, capillary     Status: Abnormal   Collection Time: 01/17/21  8:02 PM  Result Value Ref Range   Glucose-Capillary 307 (H) 70 - 99 mg/dL    Comment: Glucose reference range applies only to samples taken after fasting for at least 8 hours.   Comment 1 Notify RN   Glucose, capillary     Status: Abnormal   Collection Time: 01/18/21  5:48 AM  Result Value Ref Range   Glucose-Capillary 144 (H) 70 - 99 mg/dL    Comment: Glucose reference range applies only to samples taken after fasting for at least 8 hours.  Glucose, capillary     Status: Abnormal   Collection Time: 01/18/21 11:51 AM  Result Value Ref Range   Glucose-Capillary 177 (H) 70 - 99 mg/dL    Comment: Glucose reference range applies only to samples taken after fasting for at least 8 hours.  Glucose, capillary     Status: Abnormal   Collection Time: 01/18/21  4:53 PM  Result Value Ref Range   Glucose-Capillary 193 (H) 70 - 99 mg/dL    Comment: Glucose reference range applies only to samples taken after fasting for at least 8 hours.  Glucose, capillary     Status: Abnormal   Collection Time: 01/18/21  7:42 PM  Result Value Ref Range   Glucose-Capillary 267 (H) 70 - 99 mg/dL    Comment: Glucose reference range  applies only to samples taken after fasting for at least 8 hours.  Glucose, capillary     Status: Abnormal   Collection Time: 01/19/21  5:45 AM  Result Value Ref Range   Glucose-Capillary 150 (H) 70 - 99 mg/dL    Comment: Glucose reference range applies only to samples taken after fasting for at least 8 hours.  Glucose, capillary     Status: Abnormal   Collection Time: 01/19/21 12:15 PM  Result Value Ref Range   Glucose-Capillary 166 (H) 70 - 99 mg/dL    Comment: Glucose reference range applies only to samples taken after fasting for at least 8 hours.    Blood Alcohol level:  Lab Results  Component Value Date   ETH <10 01/08/2021   ETH <10 [id number]    Metabolic Disorder Labs: Lab Results  Component Value Date   HGBA1C 5.8 (H) 01/11/2021   MPG 119.76 01/11/2021   MPG 131.24 08/16/2020   No results found for: PROLACTIN Lab Results  Component Value Date   CHOL 152 01/11/2021   TRIG 130 01/11/2021   HDL 50 01/11/2021   CHOLHDL 3.0 01/11/2021   VLDL 26 01/11/2021   LDLCALC 76 01/11/2021    Physical Findings: AIMS:  Facial and Oral Movements Muscles of Facial Expression: None, normal Lips and Perioral Area: Minimal Jaw: None, normal Tongue: None, normal, Extremity Movements Upper (arms, wrists, hands, fingers): None, normal Lower (legs, knees, ankles, toes): None, normal,  Trunk Movements Neck, shoulders, hips: None, normal,  Overall Severity Severity of abnormal movements (highest score from questions above): None, normal Incapacitation due to abnormal movements: None, normal Patient's awareness of abnormal movements (rate only patient's report): No Awareness,  Dental Status Current problems with teeth and/or dentures?: Yes Does patient usually wear dentures?: No  CIWA:    COWS:     Musculoskeletal: Strength & Muscle Tone: within normal limits Gait & Station: NA Patient leans: N/A  Psychiatric Specialty Exam:  Presentation  General Appearance:  Disheveled   Eye Contact:Minimal   Speech:Normal Rate; Clear and Coherent (At times mumbling)   Speech Volume:Decreased   Handedness:Right    Mood and Affect  Mood:euthymic   Affect: Constricted    Thought Process  Thought Processes: severely disorganized, tangential  Descriptions of Associations:Circumstantial   Orientation: unable to assess  Thought Content: scattered illogical content, denies SI/HI/AVH/first rank symptoms  History of Schizophrenia/Schizoaffective disorder:Yes   Duration of Psychotic Symptoms:Greater than six months  Hallucinations:Hallucinations: None   Ideas of Reference:None   Suicidal Thoughts:Suicidal Thoughts: No   Homicidal Thoughts:Homicidal Thoughts: No    Sensorium  Memory:Immediate Good; Recent Poor; Remote Poor   Judgment:Impaired   Insight:Lacking    Executive Functions  Concentration:Fair   Attention Span:Fair   Recall:Poor   Fund of Knowledge:Fair   Language:Fair    Psychomotor Activity  Psychomotor Activity:Psychomotor Activity: Tremor   Assets  Assets:Leisure Time    Sleep  Sleep: 7.75 hours     Physical Exam: Physical Exam Vitals and nursing note reviewed.  Constitutional:      General: He is awake. He is not in acute distress.    Appearance: He is not ill-appearing or diaphoretic.  HENT:     Head: Normocephalic.  Pulmonary:     Effort: Pulmonary effort is normal.  Neurological:     General: No focal deficit present.     Mental Status: He is alert. He is disoriented.     Motor: Tremor present.  Psychiatric:        Behavior: Behavior is cooperative.   Review of Systems  Respiratory:  Negative for shortness of breath.   Cardiovascular:  Negative for chest pain.  Gastrointestinal:  Negative for nausea and vomiting.  Neurological:  Negative for dizziness and headaches.   Blood pressure (!) 131/108, pulse 87, temperature (!) 97.3 F (36.3 C), temperature source Oral,  resp. rate 16, height 6\' 2"  (1.88 m), weight 91.6 kg, SpO2 98 %. Body mass index is 25.94 kg/m.   Treatment Plan & Assessment Summary: Principal Problem:   Schizophrenia (Daniel Foley) Active Problems:   Controlled type 2 diabetes mellitus with hypoglycemia ([place])   Gynecomastia, male   Drug-induced parkinsonism ([place])   Hyperlipidemia   Unspecified dementia, mild, without behavioral disturbance, psychotic disturbance, mood disturbance, and anxiety   Tel Catton is a 51 y.o. male with PPHx of schizophrenia, seizures (on Keppra, Depakote, lacosamide) living at a boarding home who presented involuntarily via [employer] after attempting to walk into heavy traffic and was found to be psychotic and responding internally, now admitted for management of acute psychosis exacerbated by recent cocaine use.  [place] Day 8   PLAN: Schizophrenia by history, considering possible delirium Severely disorganized thought process, however, not responding to internal stimuli.  Delirium precautions, bowel regimen, melatonin 3 mg qHS Consider LAI once stabilized Continued Zyprexa 5mg  qAM and 15mg  qHS   Suspected drug induced parkinsonism and R/o Tardive dyskinesia:  Tremors are stable, and markedly improved from admission. Unclear etiology, possibly 2/2 amantadine vs. VPA or both. AIMS 1 for perioral movements.  Held home [place] Monitor with AIMS and tremors Continued amantadine 100 mg daily  Prolactin WNL  Dementia vs. cognitive impairment Suspected multifactorial considering his history of recurrent seizures, substance abuse, diabetes, hyperlipidemia.  His memory was improved since yesterday, as he was able to recall after a few minutes.  Unclear, however this may be patient's baseline. Modifiable risk factors management per below  Other medical conditions: Seizure disorder: Continued home Depakote DR 1000 mg BID,  lacosamide 150 mg BID, Keppra 500 mg BID.  VPA level returned now therapeutic. HLD: Continued home  rosuvastatin 20 mg qHS T2DM: Continued home Farxiga 10 mg daily, did not restart home semaglutide; FSBS AC/HS with SSI ordered H/o CKD?:  Held home finerenone H/o COPD?: Held home Symbicort, Spiriva Respimat  Dispo: Pending as patient does not want to return to the boardinghouse. Stated that his niece/sister "Na-Na" lives in [place], but he has no way to contact her. Stated that he has no other family anywhere.  We will work with CSW to assess if patient qualifies for ACTT Continued to attempt reaching caregiver to assess patient's baseline  Safety, monitoring and disposition planning: The patient was seen and evaluated on the unit.  The patient's chart was reviewed and nursing notes were reviewed.  The patient's case was discussed in multidisciplinary team meeting.  Plan and drug side effects were discussed with patient who was amendable. Social work and case management to assist with discharge planning and identification of hospital follow-up needs prior to discharge. Discharge Concerns: Need to establish a safety plan; Medication compliance and effectiveness Discharge Goals: Return home with outpatient referrals for mental health follow-up including medication management/psychotherapy Short Term Goals: Ability to identify triggers associated with substance abuse/mental health issues will improve Long Term Goals: Improvement in symptoms so as ready for discharge Safety and Monitoring: Involuntary admission to inpatient psychiatric unit for safety, stabilization and treatment Daily contact with patient to assess and evaluate symptoms and progress in treatment and medical management Patient's case to be discussed in multi-disciplinary team meeting Observation Level : q15 minute checks Vital signs:  q12 hours Precautions: suicide I certify that inpatient services furnished can reasonably be expected to improve the patient's condition.     Signed: Corky Sox, MD PGY-1

## 2021-01-19 NOTE — Progress Notes (Signed)
Patient has been up and active on the unit, attended group this evening . His blood sugar was 211 and he was still asking for apple pie after he had eaten ice cream.Writer encouraged him to limit his sugar intake. Patient currently denies having pain, -si/hi/a/v hall. Support and encouragement offered, safety maintained on unit, will continue to monitor.

## 2021-01-20 LAB — PROLACTIN: Prolactin: 13.6 ng/mL (ref 4.0–15.2)

## 2021-01-20 LAB — GLUCOSE, CAPILLARY
Glucose-Capillary: 169 mg/dL — ABNORMAL HIGH (ref 70–99)
Glucose-Capillary: 202 mg/dL — ABNORMAL HIGH (ref 70–99)
Glucose-Capillary: 203 mg/dL — ABNORMAL HIGH (ref 70–99)
Glucose-Capillary: 248 mg/dL — ABNORMAL HIGH (ref 70–99)

## 2021-01-20 NOTE — Progress Notes (Addendum)
[facility] MD Progress Note  01/20/2021 11:04 AM Lenzie Barbieri  MRN: [id number] CC: Psychosis  Subjective: Lucius Bier is a 51 y.o. male with PPHx of schizophrenia, seizures (on Keppra, Depakote, lacosamide) living at a boarding home who presented involuntarily via [employer] after attempting to walk into heavy traffic and was found to be psychotic and responding internally, now admitted for management of acute psychosis.  [place] Day 9   Yesterday's recommendation by the Psychiatry team No med changes Continue Zyprexa 5mg  qam and 15mg  qhs   Melatonin 3 mg qHS Continued amantadine to 100 mg daily  Interim History: Patient was initially seen laying in bed resting, and appeared guarded and paranoid.  Stated "what is my name?".  When responding to his name, patient said that he was in the hospital for seizures.  Discussed with him that he is here for psychosis.  He continues to be alert and oriented to himself only, and stated that he does not remember the other orientation questions.  After reorientation, patient would be able to recall year, city, and reason for admission after about 3 minutes. He was not able to recall where he was prior to hospitalization.  Stated that the last place he lived was in [place]. During conversation patient would exhibit disorganized speech, talking about why "who gave me the right to be here and that people are after me, and because my music".  Patient stated that his sleep, appetite, mood are good.  Patient denied SI/HI/AVH, delusions, paranoia, first rank symptoms, and contracted to safety on the unit. Patient was not grossly responding to internal/external stimuli nor made any delusional statements during encounter.   Of note, patient was seen talking to himself and endorsing AVH, which was not the case this AM.   Diagnosis:  Principal Problem:   Schizophrenia ([place]) Active Problems:   Drug-induced parkinsonism (HCC)   Unspecified dementia, mild, without behavioral  disturbance, psychotic disturbance, mood disturbance, and anxiety   Gynecomastia, male   Controlled type 2 diabetes mellitus with hypoglycemia ([place])   Hyperlipidemia  Total Time spent with patient:  I personally spent 30 minutes on the unit in direct patient care. The direct patient care time included face-to-face time with the patient, reviewing the patient's chart, communicating with other professionals, and coordinating care. Greater than 50% of this time was spent in counseling or coordinating care with the patient regarding goals of hospitalization, psycho-education, and discharge planning needs.   Past Psychiatric History: See H&P  Past Medical History:  Past Medical History:  Diagnosis Date   Chest pain 08/15/2020   Chronic kidney disease    Diabetes mellitus without complication ([place])    Neuromuscular disorder ([place])    Observed seizure-like activity ([place]) 06/22/2020   Schizophrenia ([place])    Witnessed seizure-like activity ([place]) 06/21/2020   History reviewed. No pertinent surgical history. Family History:  Family History  Problem Relation Age of Onset   Seizures Brother    Family Psychiatric  History: See H&P Social History:  Social History   Substance and Sexual Activity  Alcohol Use Yes     Social History   Substance and Sexual Activity  Drug Use Not Currently    Social History   Socioeconomic History   Marital status: Unknown    Spouse name: Not on file   Number of children: Not on file   Years of education: Not on file   Highest education level: Not on file  Occupational History   Not on file  Tobacco Use  Smoking status: Every Day    Packs/day: 0.25    Years: 0.50    Pack years: 0.13    Types: Cigarettes   Smokeless tobacco: Never  Vaping Use   Vaping Use: Never used  Substance and Sexual Activity   Alcohol use: Yes   Drug use: Not Currently   Sexual activity: Not Currently    Birth control/protection: Condom  Other Topics Concern   Not on file   Social History Narrative   Not on file   Social Determinants of Health   Financial Resource Strain: Not on file  Food Insecurity: Not on file  Transportation Needs: Not on file  Physical Activity: Not on file  Stress: Not on file  Social Connections: Not on file   Additional Social History:   Sleep: Per above  Appetite:  Per above  Current Medications: Current Facility-Administered Medications  Medication Dose Route Frequency Provider Last Rate Last Admin   acetaminophen (TYLENOL) tablet 650 mg  650 mg Oral Q6H PRN Starkes-Perry, Gayland Curry, FNP       alum & mag hydroxide-simeth (MAALOX/MYLANTA) 200-200-20 MG/5ML suspension 30 mL  30 mL Oral Q4H PRN Starkes-Perry, Gayland Curry, FNP       amantadine (SYMMETREL) capsule 100 mg  100 mg Oral Daily Merrily Brittle, DO   100 mg at 01/20/21 [id number]   dapagliflozin propanediol (FARXIGA) tablet 10 mg  10 mg Oral Daily Suella Broad, FNP   10 mg at 01/20/21 0823   divalproex (DEPAKOTE) DR tablet 1,000 mg  1,000 mg Oral BID Suella Broad, FNP   1,000 mg at 01/20/21 [id number]   docusate sodium (COLACE) capsule 100 mg  100 mg Oral BID Massengill, Ovid Curd, MD   100 mg at 01/20/21 0823   insulin aspart (novoLOG) injection 0-9 Units  0-9 Units Subcutaneous TID WC Harlow Asa, MD   2 Units at 01/20/21 0636   lacosamide (VIMPAT) tablet 150 mg  150 mg Oral BID Merrily Brittle, DO   150 mg at 01/20/21 [id number]   levETIRAcetam (KEPPRA) tablet 500 mg  500 mg Oral BID Suella Broad, FNP   500 mg at 01/20/21 0823   OLANZapine zydis (ZYPREXA) disintegrating tablet 5 mg  5 mg Oral Q8H PRN Massengill, Ovid Curd, MD       And   LORazepam (ATIVAN) tablet 1 mg  1 mg Oral PRN Massengill, Ovid Curd, MD       And   ziprasidone (GEODON) injection 20 mg  20 mg Intramuscular PRN Massengill, Ovid Curd, MD       magnesium hydroxide (MILK OF MAGNESIA) suspension 30 mL  30 mL Oral Daily PRN Starkes-Perry, Gayland Curry, FNP       melatonin tablet 3 mg  3 mg Oral QHS Merrily Brittle, DO   3 mg at 01/19/21 2044   nicotine (NICODERM CQ - dosed in mg/24 hours) patch 14 mg  14 mg Transdermal Daily Suella Broad, FNP   14 mg at 01/20/21 0824   OLANZapine zydis (ZYPREXA) disintegrating tablet 15 mg  15 mg Oral QHS Merrily Brittle, DO   15 mg at 01/19/21 2044   OLANZapine zydis (ZYPREXA) disintegrating tablet 5 mg  5 mg Oral Daily Merrily Brittle, DO   5 mg at 01/20/21 0826   rosuvastatin (CRESTOR) tablet 20 mg  20 mg Oral QPM Merrily Brittle, DO   20 mg at 01/19/21 1704    Lab Results:  Results for orders placed or performed during the hospital encounter of 01/11/21 (from  the past 48 hour(s))  Glucose, capillary     Status: Abnormal   Collection Time: 01/18/21 11:51 AM  Result Value Ref Range   Glucose-Capillary 177 (H) 70 - 99 mg/dL    Comment: Glucose reference range applies only to samples taken after fasting for at least 8 hours.  Glucose, capillary     Status: Abnormal   Collection Time: 01/18/21  4:53 PM  Result Value Ref Range   Glucose-Capillary 193 (H) 70 - 99 mg/dL    Comment: Glucose reference range applies only to samples taken after fasting for at least 8 hours.  Glucose, capillary     Status: Abnormal   Collection Time: 01/18/21  7:42 PM  Result Value Ref Range   Glucose-Capillary 267 (H) 70 - 99 mg/dL    Comment: Glucose reference range applies only to samples taken after fasting for at least 8 hours.  Glucose, capillary     Status: Abnormal   Collection Time: 01/19/21  5:45 AM  Result Value Ref Range   Glucose-Capillary 150 (H) 70 - 99 mg/dL    Comment: Glucose reference range applies only to samples taken after fasting for at least 8 hours.  Glucose, capillary     Status: Abnormal   Collection Time: 01/19/21 12:15 PM  Result Value Ref Range   Glucose-Capillary 166 (H) 70 - 99 mg/dL    Comment: Glucose reference range applies only to samples taken after fasting for at least 8 hours.  Glucose, capillary     Status: Abnormal   Collection Time:  01/19/21  4:58 PM  Result Value Ref Range   Glucose-Capillary 230 (H) 70 - 99 mg/dL    Comment: Glucose reference range applies only to samples taken after fasting for at least 8 hours.   Comment 1 Notify RN    Comment 2 Document in Chart   Glucose, capillary     Status: Abnormal   Collection Time: 01/19/21  7:48 PM  Result Value Ref Range   Glucose-Capillary 211 (H) 70 - 99 mg/dL    Comment: Glucose reference range applies only to samples taken after fasting for at least 8 hours.    Blood Alcohol level:  Lab Results  Component Value Date   ETH <10 01/08/2021   ETH <10 [id number]    Metabolic Disorder Labs: Lab Results  Component Value Date   HGBA1C 5.8 (H) 01/11/2021   MPG 119.76 01/11/2021   MPG 131.24 08/16/2020   No results found for: PROLACTIN Lab Results  Component Value Date   CHOL 152 01/11/2021   TRIG 130 01/11/2021   HDL 50 01/11/2021   CHOLHDL 3.0 01/11/2021   VLDL 26 01/11/2021   LDLCALC 76 01/11/2021    Physical Findings: AIMS:  Facial and Oral Movements Muscles of Facial Expression: None, normal Lips and Perioral Area: Minimal Jaw: None, normal Tongue: None, normal, Extremity Movements Upper (arms, wrists, hands, fingers): None, normal Lower (legs, knees, ankles, toes): None, normal,  Trunk Movements Neck, shoulders, hips: None, normal,  Overall Severity Severity of abnormal movements (highest score from questions above): None, normal Incapacitation due to abnormal movements: None, normal Patient's awareness of abnormal movements (rate only patient's report): No Awareness,  Dental Status Current problems with teeth and/or dentures?: Yes Does patient usually wear dentures?: No  CIWA:    COWS:     Musculoskeletal: Strength & Muscle Tone: within normal limits Gait & Station: NA Patient leans: N/A  Psychiatric Specialty Exam:  Presentation  General Appearance: Disheveled   Eye  Contact:Minimal   Speech:Garbled; Normal Rate (Mumbling  at times)   Speech Volume:Decreased   Handedness:Right    Mood and Affect  Mood: euthymic   Affect: Constricted    Thought Process  Thought Processes: severely disorganized, tangential  Descriptions of Associations:Loose   Orientation: Self only  Thought Content: scattered illogical content, denies SI/HI/AVH/first rank symptoms  History of Schizophrenia/Schizoaffective disorder:Yes   Duration of Psychotic Symptoms:Greater than six months  Hallucinations:Hallucinations: None   Ideas of Reference:None   Suicidal Thoughts:Suicidal Thoughts: No   Homicidal Thoughts:Homicidal Thoughts: No    Sensorium  Memory:Immediate Fair; Recent Poor; Remote Poor (Unable to perform 3 minute recall, does not remember where he came from prior to admission)   Judgment:Impaired   Insight:Lacking    Executive Functions  Concentration:Fair   Attention Span:Fair   [place]    Psychomotor Activity  Psychomotor Activity:Psychomotor Activity: Extrapyramidal Side Effects (EPS); Tremor Extrapyramidal Side Effects (EPS): Parkinsonism AIMS Completed?: No (Patient declined)   Assets  Assets:Leisure Time    Sleep  Sleep: 7.75 hours     Physical Exam: Physical Exam Vitals and nursing note reviewed.  Constitutional:      General: He is awake. He is not in acute distress.    Appearance: He is not ill-appearing or diaphoretic.  HENT:     Head: Normocephalic.  Pulmonary:     Effort: Pulmonary effort is normal.  Neurological:     General: No focal deficit present.     Mental Status: He is alert. He is disoriented.     Motor: Tremor present.  Psychiatric:        Behavior: Behavior is cooperative.   Review of Systems  Respiratory:  Negative for shortness of breath.   Cardiovascular:  Negative for chest pain.  Gastrointestinal:  Negative for nausea and vomiting.  Neurological:  Negative for dizziness and  headaches.   Blood pressure (!) 116/50, pulse (!) 107, temperature (!) 97.5 F (36.4 C), temperature source Oral, resp. rate 16, height 6\' 2"  (1.88 m), weight 91.6 kg, SpO2 99 %. Body mass index is 25.94 kg/m.   Treatment Plan & Assessment Summary: Principal Problem:   Schizophrenia ([place]) Active Problems:   Drug-induced parkinsonism ([place])   Unspecified dementia, mild, without behavioral disturbance, psychotic disturbance, mood disturbance, and anxiety   Gynecomastia, male   Controlled type 2 diabetes mellitus with hypoglycemia ([place])   Hyperlipidemia   Topher Shirer is a 51 y.o. male with PPHx of schizophrenia, seizures (on Keppra, Depakote, lacosamide) living at a boarding home who presented involuntarily via [employer] after attempting to walk into heavy traffic and was found to be psychotic and responding internally, now admitted for management of acute psychosis exacerbated by recent cocaine use.  [place] Day 9   PLAN: Schizophrenia by history, considering possible delirium Disorganized thought process, however, not responding to internal stimuli. Superficially goal directed when answering questions. Overnight patient was witnessed to have AVH and internally preoccupied.  Delirium precautions, bowel regimen, melatonin 3 mg qHS Consider LAI once stabilized Continued Zyprexa 5mg  qAM and 15mg  qHS   Suspected drug induced parkinsonism and R/o Tardive dyskinesia:  Tremors are stable, and markedly improved from admission. Unclear etiology, possibly 2/2 amantadine vs. VPA or both. AIMS 1 for perioral movements although exam limited due to patient's psychosis Held home [place] Monitored with AIMS and tremors Continued amantadine 100 mg daily and will taper down slowly  Dementia vs. cognitive impairment Suspected multifactorial considering his history of recurrent  seizures, substance abuse, diabetes, hyperlipidemia.  His memory was improved since yesterday, as he was able to recall after a few  minutes.  Unclear, however this may be patient's baseline. Modifiable risk factors management per below  Other medical conditions: Seizure disorder: Continued home Depakote DR 1000 mg BID, lacosamide 150 mg BID, Keppra 500 mg BID.  VPA level returned now therapeutic. HLD: Continued home rosuvastatin 20 mg qHS T2DM: Continued home Farxiga 10 mg daily, did not restart home semaglutide; FSBS AC/HS with SSI ordered H/o CKD?:  Held home finerenone H/o COPD?: Held home Symbicort, Spiriva Respimat  Dispo: Pending as patient does not want to return to the boardinghouse. Stated that his niece/sister "Na-Na" lives in [place], but he has no way to contact her. Stated that he has no other family anywhere.  We will work with CSW to assess if patient qualifies for ACTT Continued to attempt reaching caregiver to assess patient's baseline  Safety, monitoring and disposition planning: The patient was seen and evaluated on the unit.  The patient's chart was reviewed and nursing notes were reviewed.  The patient's case was discussed in multidisciplinary team meeting.  Plan and drug side effects were discussed with patient who was amendable. Social work and case management to assist with discharge planning and identification of hospital follow-up needs prior to discharge. Discharge Concerns: Need to establish a safety plan; Medication compliance and effectiveness Discharge Goals: Return home with outpatient referrals for mental health follow-up including medication management/psychotherapy Short Term Goals: Ability to identify triggers associated with substance abuse/mental health issues will improve Long Term Goals: Improvement in symptoms so as ready for discharge Safety and Monitoring: Involuntary admission to inpatient psychiatric unit for safety, stabilization and treatment Daily contact with patient to assess and evaluate symptoms and progress in treatment and medical management Patient's case  to be discussed in multi-disciplinary team meeting Observation Level : q15 minute checks Vital signs:  q12 hours Precautions: suicide I certify that inpatient services furnished can reasonably be expected to improve the patient's condition.     Signed: Merrily Brittle, DO Psychiatry Resident, PGY-1 [facility] 01/20/2021, 11:16 AM

## 2021-01-20 NOTE — BHH Group Notes (Signed)
Adult Psychoeducational Group Note  Date:  01/20/2021 Time:  11:17 AM  Group Topic/Focus:  Goals Group:   The focus of this group is to help patients establish daily goals to achieve during treatment and discuss how the patient can incorporate goal setting into their daily lives to aide in recovery.  Participation Level:  Minimal  Participation Quality:  Attentive  Affect:  Flat  Cognitive:  Lacking  Insight: Lacking  Engagement in Group:  Limited  Modes of Intervention:  Education, Orientation, and Socialization  Additional Comments:  Pt's goal for today is "flying an airplane."  Tania Ade 01/20/2021, 11:17 AM

## 2021-01-20 NOTE — BHH Counselor (Addendum)
Clinical Social Work Note  CSW attempted to reach [employer] at [phone].  A HIPAA-compliant voicemail was left asking for a call back.  CSW attempted to reach mother Nida Boatman at [phone].  A message could not be left.  At treatment team request, CSW did [facility] [facility] report with social worker Jennette Kettle on 01/20/2021 at approximately 2:30pm.  They will staff the information and decide on whether to screen the case in or out.  CSW team can follow up in the coming days.  Ambrose Mantle, LCSW 01/20/2021, 12:53 PM .

## 2021-01-20 NOTE — Progress Notes (Signed)
Pt presents animated with pleasant mood, fair eye contact, logical and assertive on interactions. However, pt noted to be agitated with loud abusive verbal outburst in his room and in hall, pacing while responding to internal stimuli. Observed with worsening AVH in the afternoon and evening as shift progress X 2 days now. Verbal redirections was ineffective at the time. PRN Ativan 1 mg and Zydis 5 mg both PO given at 1600 with moderate effect when reassessed at 1700 prior to dinner. Support and reassurance offered. All medications given as ordered. Safety checks maintained at Q 15 minutes intervals. He required multiple verbal redirections this shift and verbal education to abstain from using sugar in his coffee due to him being a diabetic.Pt exhibit no understanding. Pt safe on and off milieu without self harm gestures thus far. Tolerates meals, fluids and medications well without discomfort. Showered and change his clothes this shift when prompted.

## 2021-01-20 NOTE — Progress Notes (Signed)
Patient has been up and active on the unit, attended group this evening and has voiced no complaints. Writer had to remind him about his ice cream intake. Patient had 2 containers of ice cream in his pocket while at medication window. Patient currently denies having pain, -si/hi/a/v hall. Support and encouragement offered, safety maintained on unit, will continue to monitor.

## 2021-01-20 NOTE — Group Note (Signed)
[facility] LCSW Group Therapy Note  Date/Time:  01/20/2021  11:00AM-12:00PM  Type of Therapy and Topic:  Group Therapy:  Music and Mood  Participation Level:  Active   Description of Group: In this process group, members listened to a variety of genres of music and identified that different types of music evoke different responses.  Patients were encouraged to identify music that was soothing for them and music that was energizing for them.  Patients discussed how this knowledge can help with wellness and recovery in various ways including managing depression and anxiety as well as encouraging healthy sleep habits.    Therapeutic Goals: Patients will explore the impact of different varieties of music on mood Patients will verbalize the thoughts they have when listening to different types of music Patients will identify music that is soothing to them as well as music that is energizing to them Patients will discuss how to use this knowledge to assist in maintaining wellness and recovery Patients will explore the use of music as a coping skill  Summary of Patient Progress:  At the beginning of group, patient expressed he had no feelings at all.  Throughout the group, he swayed his body to the music or tapped his feet to the beat.  When asked how he felt after various songs, he indicted they made him feel good, relaxed, and happy..  At the end of group, patient expressed his mood is improving through the medication.    Therapeutic Modalities: Solution Focused Brief Therapy Activity   Ambrose Mantle, LCSW

## 2021-01-20 NOTE — Progress Notes (Signed)
Adult Psychoeducational Group Note  Date:  01/20/2021 Time:  9:18 PM  Group Topic/Focus:  Wrap-Up Group:   The focus of this group is to help patients review their daily goal of treatment and discuss progress on daily workbooks.  Participation Level:  Active  Participation Quality:  Appropriate  Affect:  Appropriate  Cognitive:  Appropriate  Insight: Appropriate  Engagement in Group:  Engaged  Modes of Intervention:  Discussion  Additional Comments:  Pt stated his goal for today was to focus on his treatment plan. Pt stated he accomplished his goal today. Pt stated he talked with his doctor and social worker about his care today. Pt rated his overall day a 10. Pt stated he made no calls today. Pt stated he felt better about himself today. Pt stated he was able to attend all meals. Pt stated he took all medications provided today.  Pt stated his appetite was pretty good today. Pt rated sleep last night was pretty good. Pt stated the goal tonight was to get some rest. Pt stated he had no physical pain tonight. Pt deny visual hallucinations and auditory issues tonight. Pt denies thoughts of harming himself or others. Pt stated he would alert staff if anything changed.  Candy Sledge 01/20/2021, 9:18 PM

## 2021-01-20 NOTE — Progress Notes (Signed)
Pt was encouraged to go to therapeutic relaxation group but didn't attend. °

## 2021-01-21 ENCOUNTER — Encounter (HOSPITAL_COMMUNITY): Payer: Self-pay

## 2021-01-21 LAB — CBC WITH DIFFERENTIAL/PLATELET
Abs Immature Granulocytes: 0.06 10*3/uL (ref 0.00–0.07)
Basophils Absolute: 0 10*3/uL (ref 0.0–0.1)
Basophils Relative: 0 %
Eosinophils Absolute: 0.1 10*3/uL (ref 0.0–0.5)
Eosinophils Relative: 1 %
HCT: 37.8 % — ABNORMAL LOW (ref 39.0–52.0)
Hemoglobin: 12.3 g/dL — ABNORMAL LOW (ref 13.0–17.0)
Immature Granulocytes: 1 %
Lymphocytes Relative: 50 %
Lymphs Abs: 4.9 10*3/uL — ABNORMAL HIGH (ref 0.7–4.0)
MCH: 30.8 pg (ref 26.0–34.0)
MCHC: 32.5 g/dL (ref 30.0–36.0)
MCV: 94.5 fL (ref 80.0–100.0)
Monocytes Absolute: 0.7 10*3/uL (ref 0.1–1.0)
Monocytes Relative: 7 %
Neutro Abs: 4 10*3/uL (ref 1.7–7.7)
Neutrophils Relative %: 41 %
Platelets: 240 10*3/uL (ref 150–400)
RBC: 4 MIL/uL — ABNORMAL LOW (ref 4.22–5.81)
RDW: 14.2 % (ref 11.5–15.5)
WBC: 9.8 10*3/uL (ref 4.0–10.5)
nRBC: 0.2 % (ref 0.0–0.2)

## 2021-01-21 LAB — COMPREHENSIVE METABOLIC PANEL
ALT: 30 U/L (ref 0–44)
AST: 22 U/L (ref 15–41)
Albumin: 4.9 g/dL (ref 3.5–5.0)
Alkaline Phosphatase: 58 U/L (ref 38–126)
Anion gap: 9 (ref 5–15)
BUN: 11 mg/dL (ref 6–20)
CO2: 29 mmol/L (ref 22–32)
Calcium: 9.7 mg/dL (ref 8.9–10.3)
Chloride: 101 mmol/L (ref 98–111)
Creatinine, Ser: 1.03 mg/dL (ref 0.61–1.24)
GFR, Estimated: >60 mL/min (ref >60)
Glucose, Bld: 96 mg/dL (ref 70–99)
Potassium: 4.1 mmol/L (ref 3.5–5.1)
Sodium: 139 mmol/L (ref 135–145)
Total Bilirubin: 0.7 mg/dL (ref 0.3–1.2)
Total Protein: 7.5 g/dL (ref 6.5–8.1)

## 2021-01-21 LAB — GLUCOSE, CAPILLARY
Glucose-Capillary: 165 mg/dL — ABNORMAL HIGH (ref 70–99)
Glucose-Capillary: 132 mg/dL — ABNORMAL HIGH (ref 70–99)
Glucose-Capillary: 123 mg/dL — ABNORMAL HIGH (ref 70–99)

## 2021-01-21 LAB — MAGNESIUM: Magnesium: 2.3 mg/dL (ref 1.7–2.4)

## 2021-01-21 LAB — AMMONIA: Ammonia: 47 umol/L — ABNORMAL HIGH (ref 9–35)

## 2021-01-21 LAB — VALPROIC ACID LEVEL: Valproic Acid Lvl: <10 ug/mL — ABNORMAL LOW (ref 50.0–100.0)

## 2021-01-21 LAB — VITAMIN B12: Vitamin B-12: 209 pg/mL (ref 180–914)

## 2021-01-21 MED ORDER — DONEPEZIL HCL 5 MG PO TABS
5.0000 mg | ORAL_TABLET | Freq: Every day | ORAL | Status: DC
  Filled 2021-01-21 (×2): qty 1

## 2021-01-21 MED ORDER — AMANTADINE HCL 100 MG PO CAPS
100.0000 mg | ORAL_CAPSULE | ORAL | Status: DC
  Administered 2021-01-23 – 2021-01-25 (×2): 100 mg via ORAL
  Filled 2021-01-21 (×4): qty 1

## 2021-01-21 NOTE — BH IP Treatment Plan (Signed)
Interdisciplinary Treatment and Diagnostic Plan Update  01/21/2021 Time of Session: 9:45am Daniel Foley MRN: [id number]  Principal Diagnosis: Schizophrenia [facility])  Secondary Diagnoses: Principal Problem:   Schizophrenia ([place]) Active Problems:   Controlled type 2 diabetes mellitus with hypoglycemia ([place])   Gynecomastia, male   Drug-induced parkinsonism ([place])   Hyperlipidemia   Unspecified dementia, mild, without behavioral disturbance, psychotic disturbance, mood disturbance, and anxiety   Current Medications:  Current Facility-Administered Medications  Medication Dose Route Frequency Provider Last Rate Last Admin   acetaminophen (TYLENOL) tablet 650 mg  650 mg Oral Q6H PRN Starkes-Perry, Gayland Curry, FNP       alum & mag hydroxide-simeth (MAALOX/MYLANTA) 200-200-20 MG/5ML suspension 30 mL  30 mL Oral Q4H PRN Starkes-Perry, Gayland Curry, FNP       amantadine (SYMMETREL) capsule 100 mg  100 mg Oral Daily Merrily Brittle, DO   100 mg at 01/21/21 0820   dapagliflozin propanediol (FARXIGA) tablet 10 mg  10 mg Oral Daily Suella Broad, FNP   10 mg at 01/21/21 0820   divalproex (DEPAKOTE) DR tablet 1,000 mg  1,000 mg Oral BID Suella Broad, FNP   1,000 mg at 01/21/21 3790   docusate sodium (COLACE) capsule 100 mg  100 mg Oral BID Massengill, Ovid Curd, MD   100 mg at 01/21/21 0820   insulin aspart (novoLOG) injection 0-9 Units  0-9 Units Subcutaneous TID WC Harlow Asa, MD   2 Units at 01/21/21 2409   lacosamide (VIMPAT) tablet 150 mg  150 mg Oral BID Merrily Brittle, DO   150 mg at 01/21/21 7353   levETIRAcetam (KEPPRA) tablet 500 mg  500 mg Oral BID Suella Broad, FNP   500 mg at 01/21/21 0820   magnesium hydroxide (MILK OF MAGNESIA) suspension 30 mL  30 mL Oral Daily PRN Suella Broad, FNP       melatonin tablet 3 mg  3 mg Oral QHS Merrily Brittle, DO   3 mg at 01/20/21 2054   nicotine (NICODERM CQ - dosed in mg/24 hours) patch 14 mg  14 mg Transdermal Daily  Suella Broad, FNP   14 mg at 01/21/21 0823   OLANZapine zydis (ZYPREXA) disintegrating tablet 15 mg  15 mg Oral QHS Merrily Brittle, DO   15 mg at 01/20/21 2054   OLANZapine zydis (ZYPREXA) disintegrating tablet 5 mg  5 mg Oral Q8H PRN Massengill, Ovid Curd, MD   5 mg at 01/20/21 1600   And   ziprasidone (GEODON) injection 20 mg  20 mg Intramuscular PRN Massengill, Ovid Curd, MD       OLANZapine zydis (ZYPREXA) disintegrating tablet 5 mg  5 mg Oral Daily Merrily Brittle, DO   5 mg at 01/21/21 0820   rosuvastatin (CRESTOR) tablet 20 mg  20 mg Oral QPM Merrily Brittle, DO   20 mg at 01/20/21 1717   PTA Medications: Medications Prior to Admission  Medication Sig Dispense Refill Last Dose   albuterol (VENTOLIN HFA) 108 (90 Base) MCG/ACT inhaler Inhale 2 puffs into the lungs every 4 (four) hours as needed for shortness of breath.      benztropine (COGENTIN) 1 MG tablet Take 1 mg by mouth daily.      budesonide-formoterol (SYMBICORT) 160-4.5 MCG/ACT inhaler Inhale 2 puffs into the lungs 2 (two) times daily.      cholecalciferol (VITAMIN D) 25 MCG (1000 UNIT) tablet Take 1,000 Units by mouth daily.      dapagliflozin propanediol (FARXIGA) 10 MG TABS tablet Take 10 mg by  mouth daily.      Deutetrabenazine (AUSTEDO) 6 MG TABS Take 6 mg by mouth in the morning and at bedtime.      divalproex (DEPAKOTE ER) 500 MG 24 hr tablet Take 1,000 mg by mouth in the morning and at bedtime.      ferrous sulfate 325 (65 FE) MG tablet Take 325 mg by mouth daily with breakfast.      Finerenone (KERENDIA) 10 MG TABS Take 10 mg by mouth daily.      haloperidol (HALDOL) 5 MG tablet Take 5 mg by mouth daily.      Lacosamide 150 MG TABS Take 150 mg by mouth 2 (two) times daily.      levETIRAcetam (KEPPRA) 500 MG tablet Take 500 mg by mouth 2 (two) times daily.      nicotine (NICODERM CQ - DOSED IN MG/24 HOURS) 14 mg/24hr patch Place 14 mg onto the skin daily.      rosuvastatin (CRESTOR) 20 MG tablet Take 20 mg by mouth  daily.      Semaglutide (RYBELSUS) 7 MG TABS Take 7 mg by mouth daily.      Tiotropium Bromide Monohydrate (SPIRIVA RESPIMAT) 1.25 MCG/ACT AERS Inhale 2 puffs into the lungs daily.       Patient Stressors: Marital or family conflict   Medication change or noncompliance    Patient Strengths: Psychologist, clinical for treatment/growth   Treatment Modalities: Medication Management, Group therapy, Case management,  1 to 1 session with clinician, Psychoeducation, Recreational therapy.   Physician Treatment Plan for Primary Diagnosis: Schizophrenia ([place]) Long Term Goal(s):     Short Term Goals:    Medication Management: Evaluate patient's response, side effects, and tolerance of medication regimen.  Therapeutic Interventions: 1 to 1 sessions, Unit Group sessions and Medication administration.  Evaluation of Outcomes: Not Met  Physician Treatment Plan for Secondary Diagnosis: Principal Problem:   Schizophrenia ([place]) Active Problems:   Controlled type 2 diabetes mellitus with hypoglycemia ([place])   Gynecomastia, male   Drug-induced parkinsonism ([facility])   Hyperlipidemia   Unspecified dementia, mild, without behavioral disturbance, psychotic disturbance, mood disturbance, and anxiety  Long Term Goal(s):     Short Term Goals:       Medication Management: Evaluate patient's response, side effects, and tolerance of medication regimen.  Therapeutic Interventions: 1 to 1 sessions, Unit Group sessions and Medication administration.  Evaluation of Outcomes: Not Met   RN Treatment Plan for Primary Diagnosis: Schizophrenia ([place]) Long Term Goal(s): Knowledge of disease and therapeutic regimen to maintain health will improve  Short Term Goals: Ability to remain free from injury will improve, Ability to verbalize frustration and anger appropriately will improve, Ability to demonstrate self-control, Ability to participate in decision making will improve, Ability to identify and  develop effective coping behaviors will improve, and Compliance with prescribed medications will improve  Medication Management: RN will administer medications as ordered by provider, will assess and evaluate patient's response and provide education to patient for prescribed medication. RN will report any adverse and/or side effects to prescribing provider.  Therapeutic Interventions: 1 on 1 counseling sessions, Psychoeducation, Medication administration, Evaluate responses to treatment, Monitor vital signs and CBGs as ordered, Perform/monitor CIWA, COWS, AIMS and Fall Risk screenings as ordered, Perform wound care treatments as ordered.  Evaluation of Outcomes: Not Met   LCSW Treatment Plan for Primary Diagnosis: Schizophrenia ([place]) Long Term Goal(s): Safe transition to appropriate next level of care at discharge, Engage patient in therapeutic group addressing  interpersonal concerns.  Short Term Goals: Engage patient in aftercare planning with referrals and resources, Increase social support, Increase ability to appropriately verbalize feelings, Increase emotional regulation, Identify triggers associated with mental health/substance abuse issues, and Increase skills for wellness and recovery  Therapeutic Interventions: Assess for all discharge needs, 1 to 1 time with Social worker, Explore available resources and support systems, Assess for adequacy in community support network, Educate family and significant other(s) on suicide prevention, Complete Psychosocial Assessment, Interpersonal group therapy.  Evaluation of Outcomes: Not Met   Progress in Treatment: Attending groups: Yes and No. Participating in groups: Yes and No. Taking medication as prescribed: Yes. Toleration medication: Yes. Family/Significant other contact made: No, individual(s) contacted:  Have attempted to reach caregiver Patient understands diagnosis: Yes. Discussing patient identified problems/goals with staff:  Yes. Medical problems stabilized or resolved: Yes. Denies suicidal/homicidal ideation: Yes. Issues/concerns per patient self-inventory: No.     New problem(s) identified: No, Describe:  None   New Short Term/Long Term Goal(s): medication stabilization, elimination of SI thoughts, development of comprehensive mental wellness plan.    Patient Goals: "To try to go to work"    Discharge Plan or Barriers: Patient does not want to return to group home at discharge. Additional housing will be explored with this patient.   Reason for Continuation of Hospitalization: Delusions  Hallucinations Medication stabilization   Estimated Length of Stay: 3 to 5 days   Scribe for Treatment Team: Vassie Moselle, LCSW 01/21/2021 10:36 AM

## 2021-01-21 NOTE — [facility] Group Notes (Signed)
Adult Psychoeducational Group Note  Date:  01/21/2021 Time:  9:06 AM  Group Topic/Focus:  Goals Group:   The focus of this group is to help patients establish daily goals to achieve during treatment and discuss how the patient can incorporate goal setting into their daily lives to aide in recovery.  Participation Level:  Did Not Attend     Dub Mikes 01/21/2021, 9:06 AM

## 2021-01-21 NOTE — Group Note (Signed)
Recreation Therapy Group Note   Group Topic:Personal Development  Group Date: 01/21/2021 Start Time: 1000 End Time: 1025 Facilitators: Victorino Sparrow, LRT,CTRS Location: [address] Dayroom   Goal Area(s) Addresses:   Patient will successfully identify benefit of reflecting on overcoming past challenges. Patient will successfully identify one thing they would do different if given the opportunity. Patient will identify how to take lessons learned and use them post d/c.   Group Description:  Letter To My Younger Self.  Patients were given a blank outline of a mirror.  Patients were to then write a letter/advice to their younger selves.  Patients will express what they would taught themselves that would have had allowed them to make better decisions right now.   Affect/Mood: Blunted and N/A   Participation Level: Did not attend    Clinical Observations/Individualized Feedback:     Plan: Continue to engage patient in RT group sessions 2-3x/week.   Victorino Sparrow, LRT,CTRS 01/21/2021 12:09 PM

## 2021-01-21 NOTE — Progress Notes (Signed)
Adult Psychoeducational Group Note  Date:  01/21/2021 Time:  9:14 PM  Group Topic/Focus:  Wrap-Up Group:   The focus of this group is to help patients review their daily goal of treatment and discuss progress on daily workbooks.  Participation Level:  Minimal  Participation Quality:  Appropriate  Affect:  Appropriate  Cognitive:  Appropriate  Insight: Limited  Engagement in Group:  Limited  Modes of Intervention:  Discussion  Additional Comments:  Pt attended the evening wrap-up group. Tech introduced the staff for the evening, reminded group of the evening schedule and reminded them to ask for anything they need. Pt reported feeling 10 out of 10 today, with 1 being the worst and 10 being the best.  The pt is currently aligned to their daily goal. Pt subsequently explored their current concerns from the day. Pt reported they did not have any auditory hallucinations. Pt reported they did not have any visual hallucinations. Pt reported they are not currently having any thoughts of harming themselves or others. Pt reported having slept good the previous night. Lastly, pt indicated they are not currently having any pain. Group ended with a reminder of when night medications will be dispensed and the rest of the evening schedule.  Osa Craver 01/21/2021, 9:14 PM

## 2021-01-21 NOTE — Progress Notes (Signed)
Pt visible on the unit this evening, pt continues to endorse AVH, but at a decreased rate    01/21/21 2100  Psych Admission Type (Psych Patients Only)  Admission Status Involuntary  Psychosocial Assessment  Patient Complaints None  Eye Contact Fair  Facial Expression Animated  Affect Appropriate to circumstance  Speech Soft;Slow;Logical/coherent  Interaction Assertive  Motor Activity Other (Comment) (WDL)  Appearance/Hygiene Disheveled  Behavior Characteristics Cooperative  Mood Preoccupied  Aggressive Behavior  Effect No apparent injury  Thought Process  Coherency Concrete thinking  Content Blaming others  Delusions None reported or observed  Perception Hallucinations  Hallucination Auditory;Visual  Judgment Limited  Confusion None  Danger to Self  Current suicidal ideation? Denies  Danger to Others  Danger to Others None reported or observed

## 2021-01-21 NOTE — Progress Notes (Signed)
[facility] MD Progress Note  01/21/2021 3:04 PM Glendal Marks  MRN: [id number] CC: Psychosis  Subjective: Daniel Foley is a 51 y.o. male with PPHx of schizophrenia, seizures (on Keppra, Depakote, lacosamide) living at a boarding home who presented involuntarily via [employer] after attempting to walk into heavy traffic and was found to be psychotic and responding internally, now admitted for management of acute psychosis.  [place] Day 10   Yesterday's recommendation by the Psychiatry team No med changes Continue Zyprexa 5mg  qam and 15mg  qhs   Melatonin 3 mg qHS Continued amantadine to 100 mg daily  Interim History: Patient was initially seen laying in bed resting on the 500 hall, still disheveled and wearing the same shirt for the past few days with his shoes in bed.  This is the first time the patient was alert and oriented x3, to self, hospital, January 2023, and [facility].  However he does not remember the situation that brought him in.  Compared to the weekend, his thoughts process was linear, goal directed, and coherent.  Where he was able to answer questions appropriately, with intact associations.  However he continued to exhibit lapse in memory where he was unable to recall where he lived prior to hospitalization.  However when prompted with the boardinghouse, patient stated that he cannot return there.  Per RN, patient was seen talking to himself yesterday and exhibited increased psychomotor agitation, that required PRN's.  However patient denied this event last night. Patient stated that his sleep, appetite, mood are good.  He stated that his last bowel movement this morning.  Stated that he is tolerating the medications well. Patient denied SI/HI/AVH, delusions, paranoia, first rank symptoms, and contracted to safety on the unit. Patient was not grossly responding to internal/external stimuli nor made any delusional statements during encounter.  Denied any other concerns.  Of note,  patient was seen talking to himself and endorsing AVH last night x2 days, which was not the case this AM.   Diagnosis:  Principal Problem:   Schizophrenia ([place]) Active Problems:   Drug-induced parkinsonism ([place])   Delirium superimposed on dementia   Tobacco abuse   Controlled type 2 diabetes mellitus with hypoglycemia ([place])  Total Time spent with patient:  I personally spent 30 minutes on the unit in direct patient care. The direct patient care time included face-to-face time with the patient, reviewing the patient's chart, communicating with other professionals, and coordinating care. Greater than 50% of this time was spent in counseling or coordinating care with the patient regarding goals of hospitalization, psycho-education, and discharge planning needs.   Past Psychiatric History: See H&P  Past Medical History:  Past Medical History:  Diagnosis Date   Chest pain 08/15/2020   Chronic kidney disease    Diabetes mellitus without complication ([place])    Neuromuscular disorder ([place])    Observed seizure-like activity ([place]) 06/22/2020   Schizophrenia ([place])    Witnessed seizure-like activity ([place]) 06/21/2020   History reviewed. No pertinent surgical history. Family History:  Family History  Problem Relation Age of Onset   Seizures Brother    Family Psychiatric  History: See H&P Social History:  Social History   Substance and Sexual Activity  Alcohol Use Yes     Social History   Substance and Sexual Activity  Drug Use Not Currently    Social History   Socioeconomic History   Marital status: Unknown    Spouse name: Not on file   Number of children: Not on file  Years of education: Not on file   Highest education level: Not on file  Occupational History   Not on file  Tobacco Use   Smoking status: Every Day    Packs/day: 0.25    Years: 0.50    Pack years: 0.13    Types: Cigarettes   Smokeless tobacco: Never  Vaping Use   Vaping Use: Never used  Substance and  Sexual Activity   Alcohol use: Yes   Drug use: Not Currently   Sexual activity: Not Currently    Birth control/protection: Condom  Other Topics Concern   Not on file  Social History Narrative   Not on file   Social Determinants of Health   Financial Resource Strain: Not on file  Food Insecurity: Not on file  Transportation Needs: Not on file  Physical Activity: Not on file  Stress: Not on file  Social Connections: Not on file   Additional Social History:   Sleep: Per above  Appetite:  Per above  Current Medications: Current Facility-Administered Medications  Medication Dose Route Frequency Provider Last Rate Last Admin   acetaminophen (TYLENOL) tablet 650 mg  650 mg Oral Q6H PRN Starkes-Perry, Gayland Curry, FNP       alum & mag hydroxide-simeth (MAALOX/MYLANTA) 200-200-20 MG/5ML suspension 30 mL  30 mL Oral Q4H PRN Suella Broad, FNP       [START ON 01/23/2021] amantadine (SYMMETREL) capsule 100 mg  100 mg Oral Lowella Fairy, DO       dapagliflozin propanediol (FARXIGA) tablet 10 mg  10 mg Oral Daily Suella Broad, FNP   10 mg at 01/21/21 0820   divalproex (DEPAKOTE) DR tablet 1,000 mg  1,000 mg Oral BID Suella Broad, FNP   1,000 mg at 01/21/21 [id number]   docusate sodium (COLACE) capsule 100 mg  100 mg Oral BID Massengill, Ovid Curd, MD   100 mg at 01/21/21 0820   insulin aspart (novoLOG) injection 0-9 Units  0-9 Units Subcutaneous TID WC Harlow Asa, MD   1 Units at 01/21/21 1317   lacosamide (VIMPAT) tablet 150 mg  150 mg Oral BID Merrily Brittle, DO   150 mg at 01/21/21 [id number]   levETIRAcetam (KEPPRA) tablet 500 mg  500 mg Oral BID Suella Broad, FNP   500 mg at 01/21/21 0820   magnesium hydroxide (MILK OF MAGNESIA) suspension 30 mL  30 mL Oral Daily PRN Suella Broad, FNP       melatonin tablet 3 mg  3 mg Oral QHS Merrily Brittle, DO   3 mg at 01/20/21 2054   nicotine (NICODERM CQ - dosed in mg/24 hours) patch 14 mg  14 mg Transdermal  Daily Suella Broad, FNP   14 mg at 01/21/21 0823   OLANZapine zydis (ZYPREXA) disintegrating tablet 15 mg  15 mg Oral QHS Merrily Brittle, DO   15 mg at 01/20/21 2054   OLANZapine zydis (ZYPREXA) disintegrating tablet 5 mg  5 mg Oral Q8H PRN Massengill, Ovid Curd, MD   5 mg at 01/20/21 1600   And   ziprasidone (GEODON) injection 20 mg  20 mg Intramuscular PRN Massengill, Ovid Curd, MD       OLANZapine zydis (ZYPREXA) disintegrating tablet 5 mg  5 mg Oral Daily Merrily Brittle, DO   5 mg at 01/21/21 0820   rosuvastatin (CRESTOR) tablet 20 mg  20 mg Oral QPM Merrily Brittle, DO   20 mg at 01/20/21 1717    Lab Results:  Results for orders placed or  performed during the hospital encounter of 01/11/21 (from the past 48 hour(s))  Glucose, capillary     Status: Abnormal   Collection Time: 01/19/21  4:58 PM  Result Value Ref Range   Glucose-Capillary 230 (H) 70 - 99 mg/dL    Comment: Glucose reference range applies only to samples taken after fasting for at least 8 hours.   Comment 1 Notify RN    Comment 2 Document in Chart   Glucose, capillary     Status: Abnormal   Collection Time: 01/19/21  7:48 PM  Result Value Ref Range   Glucose-Capillary 211 (H) 70 - 99 mg/dL    Comment: Glucose reference range applies only to samples taken after fasting for at least 8 hours.  Glucose, capillary     Status: Abnormal   Collection Time: 01/20/21  5:23 AM  Result Value Ref Range   Glucose-Capillary 169 (H) 70 - 99 mg/dL    Comment: Glucose reference range applies only to samples taken after fasting for at least 8 hours.  Glucose, capillary     Status: Abnormal   Collection Time: 01/20/21 12:01 PM  Result Value Ref Range   Glucose-Capillary 202 (H) 70 - 99 mg/dL    Comment: Glucose reference range applies only to samples taken after fasting for at least 8 hours.  Glucose, capillary     Status: Abnormal   Collection Time: 01/20/21  5:12 PM  Result Value Ref Range   Glucose-Capillary 203 (H) 70 - 99 mg/dL     Comment: Glucose reference range applies only to samples taken after fasting for at least 8 hours.  Glucose, capillary     Status: Abnormal   Collection Time: 01/20/21  7:54 PM  Result Value Ref Range   Glucose-Capillary 248 (H) 70 - 99 mg/dL    Comment: Glucose reference range applies only to samples taken after fasting for at least 8 hours.  Glucose, capillary     Status: Abnormal   Collection Time: 01/21/21  6:33 AM  Result Value Ref Range   Glucose-Capillary 165 (H) 70 - 99 mg/dL    Comment: Glucose reference range applies only to samples taken after fasting for at least 8 hours.  Glucose, capillary     Status: Abnormal   Collection Time: 01/21/21 11:50 AM  Result Value Ref Range   Glucose-Capillary 132 (H) 70 - 99 mg/dL    Comment: Glucose reference range applies only to samples taken after fasting for at least 8 hours.    Blood Alcohol level:  Lab Results  Component Value Date   ETH <10 01/08/2021   ETH <10 [id number]    Metabolic Disorder Labs: Lab Results  Component Value Date   HGBA1C 5.8 (H) 01/11/2021   MPG 119.76 01/11/2021   MPG 131.24 08/16/2020   Lab Results  Component Value Date   PROLACTIN 13.6 01/19/2021   Lab Results  Component Value Date   CHOL 152 01/11/2021   TRIG 130 01/11/2021   HDL 50 01/11/2021   CHOLHDL 3.0 01/11/2021   VLDL 26 01/11/2021   LDLCALC 76 01/11/2021    Physical Findings: AIMS:  Facial and Oral Movements Muscles of Facial Expression: None, normal Lips and Perioral Area: Minimal Jaw: None, normal Tongue: None, normal, Extremity Movements Upper (arms, wrists, hands, fingers): None, normal Lower (legs, knees, ankles, toes): None, normal,  Trunk Movements Neck, shoulders, hips: None, normal,  Overall Severity Severity of abnormal movements (highest score from questions above): None, normal Incapacitation due to abnormal movements: None,  normal Patient's awareness of abnormal movements (rate only patient's report): No  Awareness,  Dental Status Current problems with teeth and/or dentures?: Yes Does patient usually wear dentures?: No  CIWA:    COWS:     Musculoskeletal: Strength & Muscle Tone: within normal limits Gait & Station: NA Patient leans: N/A  Psychiatric Specialty Exam:  Presentation  General Appearance: Disheveled (Wearing the same yellow shirt with stains for the past few days)   Eye Contact:Minimal   Speech:Clear and Coherent; Normal Rate (Mumbling at times)   Speech Volume:Decreased   Handedness:Right    Mood and Affect  Mood: euthymic   Affect: Constricted, congruent    Thought Process  Thought Processes: Linear, coherent, goal directed  Descriptions of Associations:Circumstantial   Orientation: Self only  Thought Content: Patient denied SI/HI/AVH, delusions, paranoia, first rank symptoms, and contracted to safety on the unit. Patient was not grossly responding to internal/external stimuli nor made any delusional statements during encounter.   History of Schizophrenia/Schizoaffective disorder:Yes   Duration of Psychotic Symptoms:Greater than six months  Hallucinations:Hallucinations: None   Ideas of Reference:None   Suicidal Thoughts:Suicidal Thoughts: No   Homicidal Thoughts:Homicidal Thoughts: No    Sensorium  Memory:Immediate Good; Recent Fair; Remote Poor   Judgment:Fair   Insight:Lacking    Executive Functions  Concentration:Fair   Attention Span:Fair   [place]    Psychomotor Activity  Psychomotor Activity:Psychomotor Activity: Tremor; Extrapyramidal Side Effects (EPS) (Tremor was noted with patient's left-sided head up off the pillow, none at rest.) Extrapyramidal Side Effects (EPS): Parkinsonism AIMS Completed?: No   Assets  Assets:Leisure Time    Sleep  Sleep: 7.75 hours     Physical Exam: Physical Exam Vitals and nursing note reviewed.  Constitutional:       General: He is awake. He is not in acute distress.    Appearance: He is not ill-appearing or diaphoretic.  HENT:     Head: Normocephalic.  Pulmonary:     Effort: Pulmonary effort is normal.  Neurological:     General: No focal deficit present.     Mental Status: He is alert. He is disoriented.     Motor: Tremor present.  Psychiatric:        Behavior: Behavior is cooperative.   Review of Systems  Respiratory:  Negative for shortness of breath.   Cardiovascular:  Negative for chest pain.  Gastrointestinal:  Negative for nausea and vomiting.  Neurological:  Negative for dizziness and headaches.   Blood pressure (!) 121/91, pulse 99, temperature 98.3 F (36.8 C), temperature source Oral, resp. rate 16, height 6\' 2"  (1.88 m), weight 91.6 kg, SpO2 99 %. Body mass index is 25.94 kg/m.   Treatment Plan & Assessment Summary: Principal Problem:   Schizophrenia ([place]) Active Problems:   Drug-induced parkinsonism ([place])   Delirium superimposed on dementia   Tobacco abuse   Controlled type 2 diabetes mellitus with hypoglycemia ([facility])   Daniel Foley is a 51 y.o. male with PPHx of schizophrenia, seizures (on Keppra, Depakote, lacosamide) living at a boarding home who presented involuntarily via [employer] after attempting to walk into heavy traffic and was found to be psychotic and responding internally, now admitted for management of acute psychosis exacerbated by recent cocaine use.  [place] Day 10   PLAN: Schizophrenia by history Consider LAI once stabilized Continued Zyprexa 5mg  qAM and 15mg  qHS Will consider augmentation with Abilify, as patient appeared much improved today with addition of Zyprexa for agitation last  night, will hold off to avoid multiple medication changes   Suspected drug induced parkinsonism, possibly essential tremor and R/o Tardive dyskinesia:  With his head on his pillow and rest, no tremor noted, however when patient lifted his neck up he exhibited a head nod  exhibiting "no", that is consistent with essential tremors.  Held home [place] Monitored with AIMS and tremors Decreased amantadine 100 mg daily to every other day We will consider propranolol for essential tremors  Dementia vs. cognitive impairment, considering possible delirium Suspected multifactorial considering his history of recurrent seizures, substance abuse, diabetes, hyperlipidemia. Overnight patient was witnessed to have AVH and internally preoccupied x2 evenings now, however today patient was alert and oriented x3 for the first time.  Delirium precautions, bowel regimen, melatonin 3 mg qHS Modifiable risk factors management per below Meds per above We will consider starting donepezil, will hold off to avoid multiple medication changes Follow-up CBC, CMP, UA, ammonia level, Depakote level, Mg2+ for delirium work-up  Other medical conditions: Seizure disorder: Continued home Depakote DR 1000 mg BID, lacosamide 150 mg BID, Keppra 500 mg BID.  VPA level returned now therapeutic. HLD: Continued home rosuvastatin 20 mg qHS T2DM: Continued home Farxiga 10 mg daily, did not restart home semaglutide; FSBS AC/HS with SSI ordered H/o CKD?:  Held home finerenone H/o COPD?: Held home Symbicort, Spiriva Respimat  Dispo: Pending as patient does not want to return to the boardinghouse. Stated that his niece/sister "Na-Na" lives in [place], but he has no way to contact her. Stated that he has no other family anywhere.  We will work with CSW to assess if patient qualifies for ACTT Continued to attempt reaching caregiver to assess patient's baseline  Safety, monitoring and disposition planning: The patient was seen and evaluated on the unit.  The patient's chart was reviewed and nursing notes were reviewed.  The patient's case was discussed in multidisciplinary team meeting.  Plan and drug side effects were discussed with patient who was amendable. Social work and case management to  assist with discharge planning and identification of hospital follow-up needs prior to discharge. Discharge Concerns: Need to establish a safety plan; Medication compliance and effectiveness Discharge Goals: Return home with outpatient referrals for mental health follow-up including medication management/psychotherapy Short Term Goals: Ability to identify triggers associated with substance abuse/mental health issues will improve Long Term Goals: Improvement in symptoms so as ready for discharge Safety and Monitoring: Involuntary admission to inpatient psychiatric unit for safety, stabilization and treatment Daily contact with patient to assess and evaluate symptoms and progress in treatment and medical management Patient's case to be discussed in multi-disciplinary team meeting Observation Level : q15 minute checks Vital signs:  q12 hours Precautions: suicide I certify that inpatient services furnished can reasonably be expected to improve the patient's condition.     Signed: Merrily Brittle, DO Psychiatry Resident, PGY-1 [facility] 01/21/2021, 3:04 PM

## 2021-01-21 NOTE — Group Note (Signed)
LCSW Group Therapy Note ° ° °Group Date: 01/21/2021 °Start Time: 1300 °End Time: 1400 ° ° ° ° °Participation Level:  Did Not Attend ° ° ° °Jadis Mika A Marvena Tally, LCSW °01/21/2021  1:14 PM   °

## 2021-01-22 LAB — GLUCOSE, CAPILLARY
Glucose-Capillary: 179 mg/dL — ABNORMAL HIGH (ref 70–99)
Glucose-Capillary: 155 mg/dL — ABNORMAL HIGH (ref 70–99)
Glucose-Capillary: 215 mg/dL — ABNORMAL HIGH (ref 70–99)
Glucose-Capillary: 223 mg/dL — ABNORMAL HIGH (ref 70–99)

## 2021-01-22 LAB — URINALYSIS, ROUTINE W REFLEX MICROSCOPIC
Bacteria, UA: NONE SEEN
Bilirubin Urine: NEGATIVE
Glucose, UA: >=500 mg/dL — AB
Ketones, ur: NEGATIVE mg/dL
Leukocytes,Ua: NEGATIVE
Nitrite: NEGATIVE
Protein, ur: NEGATIVE mg/dL
Specific Gravity, Urine: 1.006 (ref 1.005–1.030)
pH: 5 (ref 5.0–8.0)

## 2021-01-22 MED ORDER — DIVALPROEX SODIUM 250 MG PO DR TAB
750.0000 mg | DELAYED_RELEASE_TABLET | Freq: Two times a day (BID) | ORAL | Status: DC
  Administered 2021-01-22 – 2021-01-25 (×7): 750 mg via ORAL
  Filled 2021-01-22 (×12): qty 3

## 2021-01-22 MED ORDER — DONEPEZIL HCL 5 MG PO TABS
5.0000 mg | ORAL_TABLET | Freq: Every day | ORAL | Status: DC
  Administered 2021-01-23 – 2021-01-24 (×2): 5 mg via ORAL
  Filled 2021-01-22 (×4): qty 1

## 2021-01-22 NOTE — Group Note (Signed)
Recreation Therapy Group Note   Group Topic:Goal Setting  Group Date: 01/22/2021 Start Time: 0955 End Time: 1045 Facilitators: Caroll Rancher, LRT,CTRS Location: 500 Hall Dayroom   Goal Area(s) Addresses:  Patient will participate in discussion of what a goal is. Patient will successfully identify a goal for various time frames. Patient will identify the importance of goals post d/c.  Group Description: Group started with a discussion about group rules. Patients had a group conversation on SMART goals, and what the acronym stands for; specific, measurable, attainable, relevant, and time-bound. Patients were given a worksheet that gave four time frames (week, month, year and 5 years) to complete goals.  Patients then identified obstacles to prevent reaching goals, what's needed to achieve goals and what can be done now to start working towards goals.   Affect/Mood: Appropriate   Participation Level: Active   Participation Quality: Moderate Cues   Behavior: Appropriate   Speech/Thought Process: Logical and Relevant   Insight: Limited   Judgement: Limited   Modes of Intervention: Music and Worksheet   Patient Response to Interventions:  Engaged   Education Outcome:  Acknowledges education and In group clarification offered    Clinical Observations/Individualized Feedback: With the help of LRT, pt was able to complete worksheet.  Pt expressed in the next week wanting to stay here a little longer to get help.  In a month, pt wasn't to know how to get SSI check.  Pt didn't have a goal for a year but expressed in five years wanting to "get my weight up".  Pt identified obstacles as bad people.  Pt stated he needs his SSI check to achieve goals and can start today by drinking a lot water.    Plan: Continue to engage patient in RT group sessions 2-3x/week.   Caroll Rancher, LRT,CTRS 01/22/2021 11:49 AM

## 2021-01-22 NOTE — Progress Notes (Addendum)
[facility] MD Progress Note  01/22/2021 12:33 PM Daniel Foley  MRN: [id number] CC: Psychosis  Subjective: Daniel Foley is a 51 y.o. male with PPHx of schizophrenia, epilepsy (on Keppra, Depakote, lacosamide) living at a boarding home who presented involuntarily via [employer] after attempting to walk into heavy traffic and was found to be psychotic and responding internally, now admitted for management of acute psychosis.  [place] Day 77   Yesterday's recommendation by the Psychiatry team No med changes Continue Zyprexa 5mg  qam and 15mg  qhs   Melatonin 3 mg qHS Decreased amantadine 100 mg daily to every other day Started donepezil 5 mg qHS Will consider augmentation with Abilify, as patient appeared much improved today with addition of Zyprexa for agitation last night, will hold off to avoid multiple medication changes  Interim History: Patient was initially seen laying in bed resting on the 500 hall, still disheveled and wearing the same shirt for the past few days with his shoes in bed.  Patient continues to be disoriented, A&O to self, hospital, January, [place].  Initially stated that it was 2022, and did not comment on what city.  Patient continued to forget about reason for admission despite reorientation daily.   Patient's thoughts are disorganized and patient appeared guarded and stated that he does not want to be here anymore.  Patient continued to perseverate on that he will be going to [place] and living in the streets.  Stated that he does not feel safe outside of the hospital.  Patient's response to why [place] and why he does not feel safe was not coherent with some reference of needing to smoke a cigarette and people are after him.   In response to inquiry about self harm, patient replied with "I do not know", however noted that his concentration is poor, and would still be trying to answer a previous question.  Patient denied SI at this time and during the current  hospitalization. Patient stated that his sleep and appetite are appropriate and stable.  He stated that his last bowel movement this morning.  Stated that he is tolerating the medications well. He denies having AH and VH. He denies having HI. He denied any other concerns.  Diagnosis:  Principal Problem:   Schizophrenia (Daniel Foley) Active Problems:   Drug-induced parkinsonism ([place])   Delirium superimposed on dementia   Tobacco abuse   Controlled type 2 diabetes mellitus with hypoglycemia [facility])    Past Psychiatric History: See H&P  Past Medical History:  Past Medical History:  Diagnosis Date   Chest pain 08/15/2020   Chronic kidney disease    Diabetes mellitus without complication ([place])    Neuromuscular disorder ([place])    Observed seizure-like activity ([place]) 06/22/2020   Schizophrenia ([place])    Witnessed seizure-like activity ([place]) 06/21/2020   History reviewed. No pertinent surgical history. Family History:  Family History  Problem Relation Age of Onset   Seizures Brother    Family Psychiatric  History: See H&P Social History:  Social History   Substance and Sexual Activity  Alcohol Use Yes     Social History   Substance and Sexual Activity  Drug Use Not Currently    Social History   Socioeconomic History   Marital status: Unknown    Spouse name: Not on file   Number of children: Not on file   Years of education: Not on file   Highest education level: Not on file  Occupational History   Not on file  Tobacco Use  Smoking status: Every Day    Packs/day: 0.25    Years: 0.50    Pack years: 0.13    Types: Cigarettes   Smokeless tobacco: Never  Vaping Use   Vaping Use: Never used  Substance and Sexual Activity   Alcohol use: Yes   Drug use: Not Currently   Sexual activity: Not Currently    Birth control/protection: Condom  Other Topics Concern   Not on file  Social History Narrative   Not on file   Social Determinants of Health   Financial Resource Strain:  Not on file  Food Insecurity: Not on file  Transportation Needs: Not on file  Physical Activity: Not on file  Stress: Not on file  Social Connections: Not on file   Additional Social History:   Sleep: Per above  Appetite:  Per above  Current Medications: Current Facility-Administered Medications  Medication Dose Route Frequency Provider Last Rate Last Admin   acetaminophen (TYLENOL) tablet 650 mg  650 mg Oral Q6H PRN Starkes-Perry, Gayland Curry, FNP       alum & mag hydroxide-simeth (MAALOX/MYLANTA) 200-200-20 MG/5ML suspension 30 mL  30 mL Oral Q4H PRN Suella Broad, FNP       [START ON 01/23/2021] amantadine (SYMMETREL) capsule 100 mg  100 mg Oral Lowella Fairy, DO       dapagliflozin propanediol (FARXIGA) tablet 10 mg  10 mg Oral Daily Suella Broad, FNP   10 mg at 01/22/21 [id number]   divalproex (DEPAKOTE) DR tablet 750 mg  750 mg Oral BID Raechell Singleton, Ovid Curd, MD   750 mg at 01/22/21 [id number]   docusate sodium (COLACE) capsule 100 mg  100 mg Oral BID Kham Zuckerman, Ovid Curd, MD   100 mg at 01/22/21 0820   [START ON 01/23/2021] donepezil (ARICEPT) tablet 5 mg  5 mg Oral QHS Merrily Brittle, DO       insulin aspart (novoLOG) injection 0-9 Units  0-9 Units Subcutaneous TID WC Harlow Asa, MD   2 Units at 01/22/21 0631   lacosamide (VIMPAT) tablet 150 mg  150 mg Oral BID Merrily Brittle, DO   150 mg at 01/22/21 [id number]   levETIRAcetam (KEPPRA) tablet 500 mg  500 mg Oral BID Suella Broad, FNP   500 mg at 01/22/21 0820   magnesium hydroxide (MILK OF MAGNESIA) suspension 30 mL  30 mL Oral Daily PRN Suella Broad, FNP       melatonin tablet 3 mg  3 mg Oral QHS Merrily Brittle, DO   3 mg at 01/21/21 2053   nicotine (NICODERM CQ - dosed in mg/24 hours) patch 14 mg  14 mg Transdermal Daily Suella Broad, FNP   14 mg at 01/22/21 0823   OLANZapine zydis (ZYPREXA) disintegrating tablet 15 mg  15 mg Oral QHS Merrily Brittle, DO   15 mg at 01/21/21 2053   OLANZapine zydis  (ZYPREXA) disintegrating tablet 5 mg  5 mg Oral Q8H PRN Jurnei Latini, Ovid Curd, MD   5 mg at 01/20/21 1600   And   ziprasidone (GEODON) injection 20 mg  20 mg Intramuscular PRN Sherrol Vicars, Ovid Curd, MD       OLANZapine zydis (ZYPREXA) disintegrating tablet 5 mg  5 mg Oral Daily Merrily Brittle, DO   5 mg at 01/22/21 [id number]   rosuvastatin (CRESTOR) tablet 20 mg  20 mg Oral QPM Merrily Brittle, DO   20 mg at 01/21/21 1720    Lab Results:  Results for orders placed or performed during the hospital encounter of  01/11/21 (from the past 48 hour(s))  Glucose, capillary     Status: Abnormal   Collection Time: 01/20/21  5:12 PM  Result Value Ref Range   Glucose-Capillary 203 (H) 70 - 99 mg/dL    Comment: Glucose reference range applies only to samples taken after fasting for at least 8 hours.  Glucose, capillary     Status: Abnormal   Collection Time: 01/20/21  7:54 PM  Result Value Ref Range   Glucose-Capillary 248 (H) 70 - 99 mg/dL    Comment: Glucose reference range applies only to samples taken after fasting for at least 8 hours.  Glucose, capillary     Status: Abnormal   Collection Time: 01/21/21  6:33 AM  Result Value Ref Range   Glucose-Capillary 165 (H) 70 - 99 mg/dL    Comment: Glucose reference range applies only to samples taken after fasting for at least 8 hours.  Glucose, capillary     Status: Abnormal   Collection Time: 01/21/21 11:50 AM  Result Value Ref Range   Glucose-Capillary 132 (H) 70 - 99 mg/dL    Comment: Glucose reference range applies only to samples taken after fasting for at least 8 hours.  CBC with Differential/Platelet     Status: Abnormal   Collection Time: 01/21/21  6:29 PM  Result Value Ref Range   WBC 9.8 4.0 - 10.5 K/uL   RBC 4.00 (L) 4.22 - 5.81 MIL/uL   Hemoglobin 12.3 (L) 13.0 - 17.0 g/dL   HCT 37.8 (L) 39.0 - 52.0 %   MCV 94.5 80.0 - 100.0 fL   MCH 30.8 26.0 - 34.0 pg   MCHC 32.5 30.0 - 36.0 g/dL   RDW 14.2 11.5 - 15.5 %   Platelets 240 150 - 400 K/uL   nRBC  0.2 0.0 - 0.2 %   Neutrophils Relative % 41 %   Neutro Abs 4.0 1.7 - 7.7 K/uL   Lymphocytes Relative 50 %   Lymphs Abs 4.9 (H) 0.7 - 4.0 K/uL   Monocytes Relative 7 %   Monocytes Absolute 0.7 0.1 - 1.0 K/uL   Eosinophils Relative 1 %   Eosinophils Absolute 0.1 0.0 - 0.5 K/uL   Basophils Relative 0 %   Basophils Absolute 0.0 0.0 - 0.1 K/uL   WBC Morphology VARIANT LYMPHOCYTES SEEN    Immature Granulocytes 1 %   Abs Immature Granulocytes 0.06 0.00 - 0.07 K/uL   Large Granular Lymphocytes PRESENT    Polychromasia PRESENT    Target Cells PRESENT    Ovalocytes PRESENT     Comment: Performed at [facility], [place] [address] [place], [place] [address]  Comprehensive metabolic panel     Status: None   Collection Time: 01/21/21  6:29 PM  Result Value Ref Range   Sodium 139 135 - 145 mmol/L   Potassium 4.1 3.5 - 5.1 mmol/L   Chloride 101 98 - 111 mmol/L   CO2 29 22 - 32 mmol/L   Glucose, Bld 96 70 - 99 mg/dL    Comment: Glucose reference range applies only to samples taken after fasting for at least 8 hours.   BUN 11 6 - 20 mg/dL   Creatinine, Ser 1.03 0.61 - 1.24 mg/dL   Calcium 9.7 8.9 - 10.3 mg/dL   Total Protein 7.5 6.5 - 8.1 g/dL   Albumin 4.9 3.5 - 5.0 g/dL   AST 22 15 - 41 U/L   ALT 30 0 - 44 U/L   Alkaline Phosphatase 58 38 -  126 U/L   Total Bilirubin 0.7 0.3 - 1.2 mg/dL   GFR, Estimated >60 >60 mL/min    Comment: (NOTE) Calculated using the CKD-EPI Creatinine Equation (2021)    Anion gap 9 5 - 15    Comment: Performed at [facility], [place] [address], [place], [place] [address]  Valproic acid level     Status: Abnormal   Collection Time: 01/21/21  6:29 PM  Result Value Ref Range   Valproic Acid Lvl <10 (L) 50.0 - 100.0 ug/mL    Comment: RESULTS CONFIRMED BY MANUAL DILUTION Performed at [place] [address] [place], [place] [address]   Magnesium     Status: None   Collection Time: 01/21/21  6:29 PM   Result Value Ref Range   Magnesium 2.3 1.7 - 2.4 mg/dL    Comment: Performed at [facility], [place] [address], [place], [place] [address]  Ammonia     Status: Abnormal   Collection Time: 01/21/21  6:29 PM  Result Value Ref Range   Ammonia 47 (H) 9 - 35 umol/L    Comment: Performed at [facility], [place] [address], [place], [place] [address]  Vitamin B12     Status: None   Collection Time: 01/21/21  6:29 PM  Result Value Ref Range   Vitamin B-12 209 180 - 914 pg/mL    Comment: (NOTE) This assay is not validated for testing neonatal or myeloproliferative syndrome specimens for Vitamin B12 levels. Performed at [facility], [place] [address], [place], [place] [address]   Urinalysis, Routine w reflex microscopic Urine, Clean Catch     Status: Abnormal   Collection Time: 01/21/21  7:01 PM  Result Value Ref Range   Color, Urine COLORLESS (A) YELLOW   APPearance CLEAR CLEAR   Specific Gravity, Urine 1.006 1.005 - 1.030   pH 5.0 5.0 - 8.0   Glucose, UA >=500 (A) NEGATIVE mg/dL   Hgb urine dipstick SMALL (A) NEGATIVE   Bilirubin Urine NEGATIVE NEGATIVE   Ketones, ur NEGATIVE NEGATIVE mg/dL   Protein, ur NEGATIVE NEGATIVE mg/dL   Nitrite NEGATIVE NEGATIVE   Leukocytes,Ua NEGATIVE NEGATIVE   WBC, UA 0-5 0 - 5 WBC/hpf   Bacteria, UA NONE SEEN NONE SEEN    Comment: Performed at [facility], [place] [address] [place], [place] [address]  Glucose, capillary     Status: Abnormal   Collection Time: 01/21/21  7:37 PM  Result Value Ref Range   Glucose-Capillary 123 (H) 70 - 99 mg/dL    Comment: Glucose reference range applies only to samples taken after fasting for at least 8 hours.  Glucose, capillary     Status: Abnormal   Collection Time: 01/22/21  5:20 AM  Result Value Ref Range   Glucose-Capillary 179 (H) 70 - 99 mg/dL    Comment: Glucose reference range applies only to samples taken after fasting for at least 8  hours.  Glucose, capillary     Status: Abnormal   Collection Time: 01/22/21 12:11 PM  Result Value Ref Range   Glucose-Capillary 155 (H) 70 - 99 mg/dL    Comment: Glucose reference range applies only to samples taken after fasting for at least 8 hours.    Blood Alcohol level:  Lab Results  Component Value Date   [facility] <10 01/08/2021   ETH <10 [id number]    Metabolic Disorder Labs: Lab Results  Component Value Date   HGBA1C 5.8 (H) 01/11/2021  MPG 119.76 01/11/2021   MPG 131.24 08/16/2020   Lab Results  Component Value Date   PROLACTIN 13.6 01/19/2021   Lab Results  Component Value Date   CHOL 152 01/11/2021   TRIG 130 01/11/2021   HDL 50 01/11/2021   CHOLHDL 3.0 01/11/2021   VLDL 26 01/11/2021   LDLCALC 76 01/11/2021    Physical Findings: AIMS:  Facial and Oral Movements Muscles of Facial Expression: None, normal Lips and Perioral Area: Minimal Jaw: None, normal Tongue: None, normal, Extremity Movements Upper (arms, wrists, hands, fingers): None, normal Lower (legs, knees, ankles, toes): None, normal,  Trunk Movements Neck, shoulders, hips: None, normal,  Overall Severity Severity of abnormal movements (highest score from questions above): None, normal Incapacitation due to abnormal movements: None, normal Patient's awareness of abnormal movements (rate only patient's report): No Awareness,  Dental Status Current problems with teeth and/or dentures?: Yes Does patient usually wear dentures?: No  CIWA:    COWS:     Musculoskeletal: Strength & Muscle Tone: within normal limits Gait & Station: NA Patient leans: N/A  Psychiatric Specialty Exam:  Presentation  General Appearance: Disheveled (Continued to have the same yellow shirt with stains, shoe on his bed)   Eye Contact:Fair   Speech:Normal Rate (Mostly mumbling and incoherent, at times clear)   Speech Volume:Decreased   Handedness:Right    Mood and Affect  Mood: Anxious   Affect:  Constricted, and appropriate    Thought Process  Thought Processes: Disorganized, goal directed, relevant  Descriptions of Associations:Loose   Orientation: Self, hospital, [place], January.  Not year or situation or city  Thought Content: Patient denied SI/HI/AVH, first rank symptoms, and contracted to safety on the unit. Patient was not grossly responding to internal/external stimuli.  History of Schizophrenia/Schizoaffective disorder:Yes   Duration of Psychotic Symptoms:Greater than six months  Hallucinations:Hallucinations: None   Ideas of Reference:None   Suicidal Thoughts:Suicidal Thoughts: No   Homicidal Thoughts:Homicidal Thoughts: No    Sensorium  Memory:Immediate Fair; Recent Poor; Remote Poor (Is not able to remember conversation from previous day, or reorientation that occurs daily.)   [place]    Executive Functions  Concentration:Poor   Attention Span:Poor   [place]    Psychomotor Activity  Psychomotor Activity:Psychomotor Activity: Extrapyramidal Side Effects (EPS); Tremor (No tremor when patient is resting in bed, with his head resting on his pillow) Extrapyramidal Side Effects (EPS): Parkinsonism AIMS Completed?: No   Assets  Assets:Leisure Time    Sleep  Sleep: 6.25 hours     Physical Exam: Physical Exam Vitals and nursing note reviewed.  Constitutional:      General: He is awake. He is not in acute distress.    Appearance: He is not ill-appearing or diaphoretic.  HENT:     Head: Normocephalic.  Pulmonary:     Effort: Pulmonary effort is normal.  Neurological:     General: No focal deficit present.     Mental Status: He is alert. He is disoriented.     Motor: Tremor present.  Psychiatric:        Behavior: Behavior is cooperative.   Review of Systems  Respiratory:  Negative for shortness of breath.   Cardiovascular:  Negative for  chest pain.  Gastrointestinal:  Negative for nausea and vomiting.  Neurological:  Negative for dizziness and headaches.   Blood pressure 135/84, pulse 91, temperature 98 F (36.7 C), temperature source Oral, resp. rate 18, height 6\' 2"  (1.88  m), weight 91.6 kg, SpO2 99 %. Body mass index is 25.94 kg/m.   Treatment Plan & Assessment Summary: Principal Problem:   Schizophrenia ([place]) Active Problems:   Drug-induced parkinsonism ([place])   Delirium superimposed on dementia   Tobacco abuse   Controlled type 2 diabetes mellitus with hypoglycemia ([facility])   Nhan Marandola is a 51 y.o. male with PPHx of schizophrenia, seizures (on Keppra, Depakote, lacosamide) living at a boarding home who presented involuntarily via [employer] after attempting to walk into heavy traffic and was found to be psychotic and responding internally, now admitted for management of acute psychosis exacerbated by recent cocaine use.  [place] Day 11   PLAN: Schizophrenia by history Consider LAI once stabilized Continue Zyprexa 5mg  qAM and 15mg  qHS Will consider augmentation with Abilify but will hold for now as pt has less disorganization over the last 72 hours.    Suspected drug induced parkinsonism, possibly essential tremor and R/o Tardive dyskinesia:  Held home [place] Monitored with AIMS and tremors Continued amantadine 100 mg every other day We will consider propranolol for essential tremors  Dementia vs. neurocognitive impairment vs. delirium Suspected multifactorial considering his history of recurrent seizures, substance abuse, diabetes, hyperlipidemia. Overnight patient was witnessed to have AVH and internally preoccupied x2 evenings now, however today patient was alert and oriented x3 for the first time.  Delirium precautions, bowel regimen, melatonin 3 mg qHS Modifiable risk factors management per below Meds per above Start donepezil qHS (pt did not receive first dose of aricept last night)  Decreased Depakote 1000  mg to 750 mg twice daily given mildly elevated ammonia in the context of possible sundowning 2/2 neurocognitive impairment  Follow-up repeat Depakote level, given likely lab error  Other medical conditions: Seizure disorder:  DECREASE Depakote DR 750 mg BID CONTINUE lacosamide 150 mg BID CONTINUE Keppra 500 mg BID.   HLD: Continued home rosuvastatin 20 mg qHS T2DM: Continued home Farxiga 10 mg daily, did not restart home semaglutide; FSBS AC/HS with SSI ordered H/o CKD?:  Held home finerenone H/o COPD?: Held home Symbicort, Spiriva Respimat  Dispo: Pending as patient does not want to return to the boardinghouse. Stated that his niece/sister "Na-Na" lives in [place], but he has no way to contact her. Stated that he has no other family anywhere.  We will work with CSW to assess if patient qualifies for ACTT Continued to attempt reaching caregiver to assess patient's baseline  Safety, monitoring and disposition planning: The patient was seen and evaluated on the unit.  The patient's chart was reviewed and nursing notes were reviewed.  The patient's case was discussed in multidisciplinary team meeting.  Plan and drug side effects were discussed with patient who was amendable. Social work and case management to assist with discharge planning and identification of hospital follow-up needs prior to discharge. Discharge Concerns: Need to establish a safety plan; Medication compliance and effectiveness Discharge Goals: Return home with outpatient referrals for mental health follow-up including medication management/psychotherapy Short Term Goals: Ability to identify triggers associated with substance abuse/mental health issues will improve Long Term Goals: Improvement in symptoms so as ready for discharge Safety and Monitoring: Involuntary admission to inpatient psychiatric unit for safety, stabilization and treatment Daily contact with patient to assess and evaluate symptoms and progress  in treatment and medical management Patient's case to be discussed in multi-disciplinary team meeting Observation Level : q15 minute checks Vital signs:  q12 hours Precautions: suicide I certify that inpatient services furnished can reasonably be expected  to improve the patient's condition.     Signed: Merrily Brittle, DO Psychiatry Resident, PGY-1 [facility] 01/22/2021, 12:33 PM   Total Time Spent in Direct Patient Care:  I personally spent 30 minutes on the unit in direct patient care. The direct patient care time included face-to-face time with the patient, reviewing the patient's chart, communicating with other professionals, and coordinating care. Greater than 50% of this time was spent in counseling or coordinating care with the patient regarding goals of hospitalization, psycho-education, and discharge planning needs.  I have independently evaluated the patient during a face-to-face assessment on 01/22/21. I reviewed the patient's chart, and I participated in key portions of the service. I discussed the case with the [employer], and I agree with the assessment and plan of care as documented in the House Officer's note, as addended by me or notated below:  I directly edited the note, as above.   Janine Limbo, MD Psychiatrist

## 2021-01-22 NOTE — BHH Group Notes (Signed)
[place] Group Notes:  (Nursing/MHT/Case Management/Adjunct)  Date:  01/22/2021  Time:  9:36 AM  Type of Therapy:   Orientation/Goals group  Participation Level:  Minimal  Participation Quality:  Appropriate  Affect:  Appropriate  Cognitive:  Appropriate  Insight:  Appropriate  Engagement in Group:  Engaged and Improving  Modes of Intervention:  Discussion, Education, and Orientation  Summary of Progress/Problems: Pt goal for today is to keep blood sugar low, attend groups and to find out when he can go home.   Loanne Emery J Cierra Rothgeb 01/22/2021, 9:36 AM

## 2021-01-22 NOTE — BHH Counselor (Signed)
APS case worker came to meet with this patient. Per APS case worker he will attempt to reach family contacts however, states that this patient can be discharged to a shelter.      Ruthann Cancer MSW, LCSW Clincal Social Worker  [facility]

## 2021-01-22 NOTE — Progress Notes (Signed)
°   01/22/21 1500  Psych Admission Type (Psych Patients Only)  Admission Status Involuntary  Psychosocial Assessment  Patient Complaints None  Eye Contact Fair  Facial Expression Animated  Affect Appropriate to circumstance  Speech Soft;Slow;Logical/coherent  Interaction Assertive  Motor Activity Other (Comment) (WDL)  Appearance/Hygiene Disheveled  Behavior Characteristics Cooperative  Mood Preoccupied  Aggressive Behavior  Effect No apparent injury  Thought Process  Coherency Concrete thinking  Content Blaming others  Delusions None reported or observed  Perception Hallucinations  Hallucination Auditory;Visual  Judgment Limited  Confusion None  Danger to Self  Current suicidal ideation? Denies  Danger to Others  Danger to Others None reported or observed

## 2021-01-22 NOTE — Progress Notes (Signed)
°   01/22/21 0530  Sleep  Number of Hours 6.25

## 2021-01-22 NOTE — Progress Notes (Signed)
°   01/22/21 2100  Psych Admission Type (Psych Patients Only)  Admission Status Involuntary  Psychosocial Assessment  Patient Complaints None  Eye Contact Fair  Facial Expression Animated  Affect Appropriate to circumstance  Speech Soft;Slow;Logical/coherent  Interaction Assertive  Motor Activity Other (Comment) (WDL)  Appearance/Hygiene Disheveled  Behavior Characteristics Cooperative  Mood Preoccupied  Aggressive Behavior  Effect No apparent injury  Thought Process  Coherency Concrete thinking  Content Blaming others  Delusions None reported or observed  Perception Hallucinations  Hallucination Auditory;Visual  Judgment Limited  Confusion None  Danger to Self  Current suicidal ideation? Denies  Danger to Others  Danger to Others None reported or observed

## 2021-01-22 NOTE — BHH Counselor (Signed)
CSW called APS through [facility] which reported that the APS report made on 01/20/21 had been screened out.  CSW completed another APS report for this patient and provided additional information.     Ruthann Cancer MSW, LCSW Clincal Social Worker  [facility]

## 2021-01-23 LAB — GLUCOSE, CAPILLARY
Glucose-Capillary: 160 mg/dL — ABNORMAL HIGH (ref 70–99)
Glucose-Capillary: 223 mg/dL — ABNORMAL HIGH (ref 70–99)
Glucose-Capillary: 208 mg/dL — ABNORMAL HIGH (ref 70–99)
Glucose-Capillary: 251 mg/dL — ABNORMAL HIGH (ref 70–99)

## 2021-01-23 LAB — VITAMIN B1: Vitamin B1 (Thiamine): 177.8 nmol/L (ref 66.5–200.0)

## 2021-01-23 LAB — VALPROIC ACID LEVEL: Valproic Acid Lvl: 54 ug/mL (ref 50.0–100.0)

## 2021-01-23 MED ORDER — ARIPIPRAZOLE 5 MG PO TABS
5.0000 mg | ORAL_TABLET | Freq: Every day | ORAL | Status: DC
  Administered 2021-01-23 – 2021-01-24 (×2): 5 mg via ORAL
  Filled 2021-01-23 (×5): qty 1

## 2021-01-23 NOTE — Group Note (Signed)
Recreation Therapy Group Note   Group Topic:Team Building  Group Date: 01/23/2021 Start Time: 1005 End Time: 1035 Facilitators: Caroll Rancher, LRT,CTRS Location: [address] Dayroom   Goal Area(s) Addresses:  Patient will effectively work with peer towards shared goal.  Patient will identify skills used to make activity successful.  Patient will identify how skills used during activity can be applied to reach post d/c goals.   Group Description: [employer]. In teams of 5-6, patients were given 15 craft pipe cleaners. Using the materials provided, patients were instructed to compete againt the opposing team(s) to build the tallest free-standing structure from floor level. The activity was timed; difficulty increased by Clinical research associate as Production designer, theatre/television/film continued.  Systematically resources were removed with additional directions for example, placing one arm behind their back, working in silence, and shape stipulations. LRT facilitated post-activity discussion reviewing team processes and necessary communication skills involved in completion. Patients were encouraged to reflect how the skills utilized, or not utilized, in this activity can be incorporated to positively impact support systems post discharge.   Affect/Mood: N/A   Participation Level: Did not attend    Clinical Observations/Individualized Feedback:     Plan: Continue to engage patient in RT group sessions 2-3x/week.   Caroll Rancher, LRT,CTRS  01/23/2021 12:08 PM

## 2021-01-23 NOTE — Progress Notes (Signed)
°   01/23/21 2200  Psych Admission Type (Psych Patients Only)  Admission Status Involuntary  Psychosocial Assessment  Patient Complaints None  Eye Contact Fair  Facial Expression Animated  Affect Appropriate to circumstance  Speech Soft;Slow;Logical/coherent  Interaction Assertive  Motor Activity Other (Comment) (WDL)  Appearance/Hygiene Disheveled  Behavior Characteristics Cooperative  Mood Suspicious;Preoccupied  Aggressive Behavior  Effect No apparent injury  Thought Process  Coherency Concrete thinking  Content Blaming others  Delusions None reported or observed  Perception Hallucinations  Hallucination Auditory;Visual  Judgment Limited  Confusion None  Danger to Self  Current suicidal ideation? Denies  Danger to Others  Danger to Others None reported or observed

## 2021-01-23 NOTE — [facility] Group Notes (Signed)
Adult Psychoeducational Group Note  Date:  01/23/2021 Time:  10:09 AM  Group Topic/Focus:  Goals Group:   The focus of this group is to help patients establish daily goals to achieve during treatment and discuss how the patient can incorporate goal setting into their daily lives to aide in recovery.  Participation Level:  Did Not Attend    Donell Beers 01/23/2021, 10:09 AM

## 2021-01-23 NOTE — Group Note (Signed)
LCSW Group Therapy Note  Group Date: 01/23/2021 Start Time: 1300 End Time: 1400   Type of Therapy and Topic:  Group Therapy: Positive Affirmations  Participation Level:  Did Not Attend   Description of Group:   This group addressed positive affirmation towards self and others.  Patients went around the room and identified two positive things about themselves and two positive things about a peer in the room.  Patients reflected on how it felt to share something positive with others, to identify positive things about themselves, and to hear positive things from others/ Patients were encouraged to have a daily reflection of positive characteristics or circumstances.   Therapeutic Goals: Patients will verbalize two of their positive qualities Patients will demonstrate empathy for others by stating two positive qualities about a peer in the group Patients will verbalize their feelings when voicing positive self affirmations and when voicing positive affirmations of others Patients will discuss the potential positive impact on their wellness/recovery of focusing on positive traits of self and others.  Summary of Patient Progress:  Did not attend  Therapeutic Modalities:   Cognitive [place], LCSW 01/23/2021  1:45 PM

## 2021-01-23 NOTE — [facility] Group Notes (Signed)
1400 Relaxation group.  °A poem was read ''The Owl and the Chimpanzee'' in which patients were asked to identify times in which they over reacted to stress. The patients then participated in deep breathing exercises and positive affirmations to support relaxation.  °

## 2021-01-23 NOTE — Progress Notes (Signed)
°   01/23/21 0600  Sleep  Number of Hours 7.75

## 2021-01-23 NOTE — [facility] Group Notes (Signed)
The focus of this group is to help patients review their daily goal of treatment and discuss progress on daily workbooks.Pt didn't attend wrap up group.  

## 2021-01-23 NOTE — Progress Notes (Signed)
[facility] MD Progress Note  01/23/2021 9:48 AM Daniel Foley  MRN: [id number]  CC: Psychosis  Subjective: Daniel Foley reports, "I have been here for 2 weeks, but I have forgotten the reason that made me come to the hospital. My mood is good".  Reason for admission: Daniel Foley is a 51 y.o. male with PPHx of schizophrenia, epilepsy (on Keppra, Depakote, lacosamide) living at a boarding home who presented involuntarily via [employer] after attempting to walk into heavy traffic and was found to be psychotic and responding internally, now admitted for management of acute psychosis.  [facility] Day 12   Yesterday's recommendation by the Psychiatry team No med changes Continue Zyprexa  qam and  qhs   Melatonin 3 mg qHS Decreased amantadine 100 mg daily to every other day Started donepezil 5 mg qHS Will consider augmentation with Abilify, as patient appeared much improved today with addition of Zyprexa for agitation last night, will hold off to avoid multiple medication changes  Daily notes: 01-23-21 Patient is seen in his room on the 500-hall. He presents alert, making a good eye contact. He is verbally responsive, but presents as a poor historian. He is disheveled. Patient presents  disoriented to time & situation. He is oriented to self & aware he is in the hospital. He did say he is from [place] & has been in [place] x 3 months. He states that he was residing in a group home & although he was treated well there, does not want to go back to this group home after discharge. He says he is taking medications but does not know why. He denies any side effects. Patient was explained that he was brought to the hospital by the cops because he was mentally unstable & tried to walk into traffic as a result, needed medications to re-stabilize his mood. He replied, "oh yaah, but I don't remember all that". Daniel Foley currently denies any SIHI.  He does present psychotic. He was laughing inappropriately during this evaluation, appears  to be responding to some internal stimuli. Patient reports sleeping well. Reports good appetite.  He does not appear to be in any apparent distress.  Diagnosis:  Principal Problem:   Schizophrenia (HCC) Active Problems:   Tobacco abuse   Controlled type 2 diabetes mellitus with hypoglycemia (HCC)   Drug-induced parkinsonism (HCC)   Delirium superimposed on dementia  Past Psychiatric History: See H&P  Past Medical History:  Past Medical History:  Diagnosis Date   Chest pain 08/15/2020   Chronic kidney disease    Diabetes mellitus without complication (HCC)    Neuromuscular disorder (HCC)    Observed seizure-like activity (HCC) 06/22/2020   Schizophrenia (HCC)    Witnessed seizure-like activity (HCC) 06/21/2020   History reviewed. No pertinent surgical history. Family History:  Family History  Problem Relation Age of Onset   Seizures Brother    Family Psychiatric  History: See H&P  Social History:  Social History   Substance and Sexual Activity  Alcohol Use Yes     Social History   Substance and Sexual Activity  Drug Use Not Currently    Social History   Socioeconomic History   Marital status: Unknown    Spouse name: Not on file   Number of children: Not on file  [facility] Years of education: Not on file   Highest education level: Not on file  Occupational History   Not on file  Tobacco Use   Smoking status: Every Day    Packs/day: 0.25  Years: 0.50    Pack years: 0.13    Types: Cigarettes   Smokeless tobacco: Never  Vaping Use   Vaping Use: Never used  Substance and Sexual Activity   Alcohol use: Yes   Drug use: Not Currently   Sexual activity: Not Currently    Birth control/protection: Condom  Other Topics Concern   Not on file  Social History Narrative   Not on file   Social Determinants of Health   Financial Resource Strain: Not on file  Food Insecurity: Not on file  Transportation Needs: Not on file  Physical Activity: Not on file  Stress: Not on  file  Social Connections: Not on file   Additional Social History:   Sleep: Good (7.75 per documentation).  Appetite: Good.  Current Medications: Current Facility-Administered Medications  Medication Dose Route Frequency Provider Last Rate Last Admin   acetaminophen (TYLENOL) tablet 650 mg  650 mg Oral Q6H PRN Starkes-Perry, Juel Burrowakia S, FNP       alum & mag hydroxide-simeth (MAALOX/MYLANTA) 200-200-20 MG/5ML suspension 30 mL  30 mL Oral Q4H PRN Starkes-Perry, Juel Burrowakia S, FNP       amantadine (SYMMETREL) capsule 100 mg  100 mg Oral Guillermina CityQODAY Nguyen, Julie, DO   100 mg at 01/23/21 16100821   dapagliflozin propanediol (FARXIGA) tablet 10 mg  10 mg Oral Daily Maryagnes AmosStarkes-Perry, Takia S, FNP   10 mg at 01/23/21 0817   divalproex (DEPAKOTE) DR tablet 750 mg  750 mg Oral BID Massengill, Harrold DonathNathan, MD   750 mg at 01/23/21 0818   docusate sodium (COLACE) capsule 100 mg  100 mg Oral BID Massengill, Harrold DonathNathan, MD   100 mg at 01/23/21 0816   donepezil (ARICEPT) tablet 5 mg  5 mg Oral QHS Princess BruinsNguyen, Julie, DO       insulin aspart (novoLOG) injection 0-9 Units  0-9 Units Subcutaneous TID WC Comer LocketSingleton, Amy E, MD   2 Units at 01/23/21 [id number]   lacosamide (VIMPAT) tablet 150 mg  150 mg Oral BID Princess BruinsNguyen, Julie, DO   150 mg at 01/23/21 0820   levETIRAcetam (KEPPRA) tablet 500 mg  500 mg Oral BID Maryagnes AmosStarkes-Perry, Takia S, FNP   500 mg at 01/23/21 0816   magnesium hydroxide (MILK OF MAGNESIA) suspension 30 mL  30 mL Oral Daily PRN Maryagnes AmosStarkes-Perry, Takia S, FNP       melatonin tablet 3 mg  3 mg Oral QHS Princess BruinsNguyen, Julie, DO   3 mg at 01/22/21 2046   nicotine (NICODERM CQ - dosed in mg/24 hours) patch 14 mg  14 mg Transdermal Daily Maryagnes AmosStarkes-Perry, Takia S, FNP   14 mg at 01/23/21 0817   OLANZapine zydis (ZYPREXA) disintegrating tablet 15 mg  15 mg Oral QHS Princess BruinsNguyen, Julie, DO   15 mg at 01/22/21 2046   OLANZapine zydis (ZYPREXA) disintegrating tablet 5 mg  5 mg Oral Q8H PRN Massengill, Harrold DonathNathan, MD   5 mg at 01/20/21 1600   And   ziprasidone (GEODON)  injection 20 mg  20 mg Intramuscular PRN Massengill, Harrold DonathNathan, MD       OLANZapine zydis (ZYPREXA) disintegrating tablet 5 mg  5 mg Oral Daily Princess BruinsNguyen, Julie, DO   5 mg at 01/23/21 0817   rosuvastatin (CRESTOR) tablet 20 mg  20 mg Oral QPM Princess BruinsNguyen, Julie, DO   20 mg at 01/22/21 1724   Lab Results:  Results for orders placed or performed during the hospital encounter of 01/11/21 (from the past 48 hour(s))  Glucose, capillary     Status: Abnormal  Collection Time: 01/21/21 11:50 AM  Result Value Ref Range   Glucose-Capillary 132 (H) 70 - 99 mg/dL    Comment: Glucose reference range applies only to samples taken after fasting for at least 8 hours.  CBC with Differential/Platelet     Status: Abnormal   Collection Time: 01/21/21  6:29 PM  Result Value Ref Range   WBC 9.8 4.0 - 10.5 K/uL   RBC 4.00 (L) 4.22 - 5.81 MIL/uL   Hemoglobin 12.3 (L) 13.0 - 17.0 g/dL   HCT 62.8 (L) 31.5 - 17.6 %   MCV 94.5 80.0 - 100.0 fL   MCH 30.8 26.0 - 34.0 pg   MCHC 32.5 30.0 - 36.0 g/dL   RDW 16.0 73.7 - 10.6 %   Platelets 240 150 - 400 K/uL   nRBC 0.2 0.0 - 0.2 %   Neutrophils Relative % 41 %   Neutro Abs 4.0 1.7 - 7.7 K/uL   Lymphocytes Relative 50 %   Lymphs Abs 4.9 (H) 0.7 - 4.0 K/uL   Monocytes Relative 7 %   Monocytes Absolute 0.7 0.1 - 1.0 K/uL   Eosinophils Relative 1 %   Eosinophils Absolute 0.1 0.0 - 0.5 K/uL   Basophils Relative 0 %   Basophils Absolute 0.0 0.0 - 0.1 K/uL   WBC Morphology VARIANT LYMPHOCYTES SEEN    Immature Granulocytes 1 %   Abs Immature Granulocytes 0.06 0.00 - 0.07 K/uL   Large Granular Lymphocytes PRESENT    Polychromasia PRESENT    Target Cells PRESENT    Ovalocytes PRESENT     Comment: Performed at [facility], [address] [place], [place] [address]  Comprehensive metabolic panel     Status: None   Collection Time: 01/21/21  6:29 PM  Result Value Ref Range   Sodium 139 135 - 145 mmol/L   Potassium 4.1 3.5 - 5.1 mmol/L   Chloride 101 98 -  111 mmol/L   CO2 29 22 - 32 mmol/L   Glucose, Bld 96 70 - 99 mg/dL    Comment: Glucose reference range applies only to samples taken after fasting for at least 8 hours.   BUN 11 6 - 20 mg/dL   Creatinine, Ser 5.46 0.61 - 1.24 mg/dL   Calcium 9.7 8.9 - 27.0 mg/dL   Total Protein 7.5 6.5 - 8.1 g/dL   Albumin 4.9 3.5 - 5.0 g/dL   AST 22 15 - 41 U/L   ALT 30 0 - 44 U/L   Alkaline Phosphatase 58 38 - 126 U/L   Total Bilirubin 0.7 0.3 - 1.2 mg/dL   GFR, Estimated >35 >00 mL/min    Comment: (NOTE) Calculated using the CKD-EPI Creatinine Equation (2021)    Anion gap 9 5 - 15    Comment: Performed at [facility], [address] [place], [place] [address]  Valproic acid level     Status: Abnormal   Collection Time: 01/21/21  6:29 PM  Result Value Ref Range   Valproic Acid Lvl <10 (L) 50.0 - 100.0 ug/mL    Comment: RESULTS CONFIRMED BY MANUAL DILUTION Performed at [facility], [address], [place], [place] [address]   Magnesium     Status: None   Collection Time: 01/21/21  6:29 PM  Result Value Ref Range   Magnesium 2.3 1.7 - 2.4 mg/dL    Comment: Performed at [facility], [address] [place], [place] [address]  Ammonia     Status:  Abnormal   Collection Time: 01/21/21  6:29 PM  Result Value Ref Range   Ammonia 47 (H) 9 - 35 umol/L    Comment: Performed at [facility], [address] [place], [place] [address]  Vitamin B12     Status: None   Collection Time: 01/21/21  6:29 PM  Result Value Ref Range   Vitamin B-12 209 180 - 914 pg/mL    Comment: (NOTE) This assay is not validated for testing neonatal or myeloproliferative syndrome specimens for Vitamin B12 levels. Performed at [facility], [address] [place], [place] [address]   Urinalysis, Routine w reflex microscopic Urine, Clean Catch     Status: Abnormal   Collection Time: 01/21/21  7:01 PM  Result Value Ref Range    Color, Urine COLORLESS (A) YELLOW   APPearance CLEAR CLEAR   Specific Gravity, Urine 1.006 1.005 - 1.030   pH 5.0 5.0 - 8.0   Glucose, UA >=500 (A) NEGATIVE mg/dL   Hgb urine dipstick SMALL (A) NEGATIVE   Bilirubin Urine NEGATIVE NEGATIVE   Ketones, ur NEGATIVE NEGATIVE mg/dL   Protein, ur NEGATIVE NEGATIVE mg/dL   Nitrite NEGATIVE NEGATIVE   Leukocytes,Ua NEGATIVE NEGATIVE   WBC, UA 0-5 0 - 5 WBC/hpf   Bacteria, UA NONE SEEN NONE SEEN    Comment: Performed at [facility], [address], [place], [place] [address]  Glucose, capillary     Status: Abnormal   Collection Time: 01/21/21  7:37 PM  Result Value Ref Range   Glucose-Capillary 123 (H) 70 - 99 mg/dL    Comment: Glucose reference range applies only to samples taken after fasting for at least 8 hours.  Glucose, capillary     Status: Abnormal   Collection Time: 01/22/21  5:20 AM  Result Value Ref Range   Glucose-Capillary 179 (H) 70 - 99 mg/dL    Comment: Glucose reference range applies only to samples taken after fasting for at least 8 hours.  Glucose, capillary     Status: Abnormal   Collection Time: 01/22/21 12:11 PM  Result Value Ref Range   Glucose-Capillary 155 (H) 70 - 99 mg/dL    Comment: Glucose reference range applies only to samples taken after fasting for at least 8 hours.  Glucose, capillary     Status: Abnormal   Collection Time: 01/22/21  5:03 PM  Result Value Ref Range   Glucose-Capillary 215 (H) 70 - 99 mg/dL    Comment: Glucose reference range applies only to samples taken after fasting for at least 8 hours.  Glucose, capillary     Status: Abnormal   Collection Time: 01/22/21  7:32 PM  Result Value Ref Range   Glucose-Capillary 223 (H) 70 - 99 mg/dL    Comment: Glucose reference range applies only to samples taken after fasting for at least 8 hours.  Glucose, capillary     Status: Abnormal   Collection Time: 01/23/21  5:49 AM  Result Value Ref Range   Glucose-Capillary 160 (H) 70  - 99 mg/dL    Comment: Glucose reference range applies only to samples taken after fasting for at least 8 hours.  Valproic acid level     Status: None   Collection Time: 01/23/21  6:37 AM  Result Value Ref Range   Valproic Acid Lvl 54 50.0 - 100.0 ug/mL    Comment: Performed at [facility], [address], [place], [place] [address]   Blood Alcohol level:  Lab  Results  Component Value Date   ETH <10 01/08/2021   ETH <10 [id number]   Metabolic Disorder Labs: Lab Results  Component Value Date   HGBA1C 5.8 (H) 01/11/2021   MPG 119.76 01/11/2021   MPG 131.24 08/16/2020   Lab Results  Component Value Date   PROLACTIN 13.6 01/19/2021   Lab Results  Component Value Date   CHOL 152 01/11/2021   TRIG 130 01/11/2021   HDL 50 01/11/2021   CHOLHDL 3.0 01/11/2021   VLDL 26 01/11/2021   LDLCALC 76 01/11/2021   Physical Findings: AIMS:  Facial and Oral Movements Muscles of Facial Expression: None, normal Lips and Perioral Area: Minimal Jaw: None, normal Tongue: None, normal, Extremity Movements Upper (arms, wrists, hands, fingers): None, normal Lower (legs, knees, ankles, toes): None, normal,  Trunk Movements Neck, shoulders, hips: None, normal,  Overall Severity Severity of abnormal movements (highest score from questions above): None, normal Incapacitation due to abnormal movements: None, normal Patient's awareness of abnormal movements (rate only patient's report): No Awareness,  Dental Status Current problems with teeth and/or dentures?: Yes Does patient usually wear dentures?: No  CIWA:    COWS:     Musculoskeletal: Strength & Muscle Tone: within normal limits Gait & Station: NA Patient leans: N/A  Psychiatric Specialty Exam:  Presentation  General Appearance: Disheveled (Continued to have the same yellow shirt with stains, shoe on his bed)  Eye Contact:Fair  Speech:Normal Rate (Mostly mumbling and incoherent, at times clear)  Speech  Volume:Decreased  Handedness:Right  Mood and Affect  Mood: Anxious  Affect: Constricted, and appropriate  Thought Process  Thought Processes: Disorganized, goal directed, relevant  Descriptions of Associations:Loose  Orientation: Self, hospital, [place], January.  Not year or situation or city  Thought Content: Patient denied SI/HI/AVH, first rank symptoms, and contracted to safety on the unit. Patient was not grossly responding to internal/external stimuli.  History of Schizophrenia/Schizoaffective disorder:Yes  Duration of Psychotic Symptoms:Greater than six months  Hallucinations:Hallucinations: None  Ideas of Reference:None  Suicidal Thoughts:Suicidal Thoughts: No  Homicidal Thoughts:Homicidal Thoughts: No  Sensorium  Memory:Immediate Fair; Recent Poor; Remote Poor (Is not able to remember conversation from previous day, or reorientation that occurs daily.)  Adrian  Executive Functions  Concentration:Poor  Attention Span:Poor  [place]  Psychomotor Activity  Psychomotor Activity:Psychomotor Activity: Extrapyramidal Side Effects (EPS); Tremor (No tremor when patient is resting in bed, with his head resting on his pillow) Extrapyramidal Side Effects (EPS): Parkinsonism AIMS Completed?: No  Assets  Assets:Leisure Time  Sleep  Sleep: 7.75 hours   Physical Exam: Physical Exam Vitals and nursing note reviewed.  Constitutional:      General: He is awake. He is not in acute distress.    Appearance: He is not ill-appearing or diaphoretic.  HENT:     Head: Normocephalic.  Pulmonary:     Effort: Pulmonary effort is normal.  Neurological:     General: No focal deficit present.     Mental Status: He is alert. He is disoriented.     Motor: Tremor present.  Psychiatric:        Behavior: Behavior is cooperative.   Review of Systems  Respiratory:  Negative for shortness of breath.    Cardiovascular:  Negative for chest pain.  Gastrointestinal:  Negative for nausea and vomiting.  Neurological:  Negative for dizziness and headaches.   Blood pressure (!) 120/95, pulse 92, temperature (!) 97.4 F (36.3 C), temperature source Oral, resp. rate 14,  height 6\' 2"  (1.88 m), weight 91.6 kg, SpO2 97 %. Body mass index is 25.94 kg/m.  Treatment Plan & Assessment Summary:  Treatment Plan/Recommendations: 1. Admit for crisis management and stabilization, estimated length of stay 3-5 days.   2. Medication management to reduce current symptoms to base line and improve the patient's overall level of functioning: See [facility] for plan of care.  Continue inpatient hospitalization.  Will continue today 01/23/2021 plan as below except where it is noted.   Principal Problem:   Schizophrenia ([place]) Active Problems:   Tobacco abuse   Controlled type 2 diabetes mellitus with hypoglycemia ([place])   Drug-induced parkinsonism ([place])   Delirium superimposed on dementia  Josemaria Tolman is a 51 y.o. male with PPHx of schizophrenia, seizures (on Keppra, Depakote, lacosamide) living at a boarding home who presented involuntarily via [employer] after attempting to walk into heavy traffic and was found to be psychotic and responding internally, now admitted for management of acute psychosis exacerbated by recent cocaine use.  [facility] Day 12   PLAN: Schizophrenia by history Consider LAI once stabilized Continue Zyprexa 5mg  qAM & 15mg  qHS   Suspected drug induced parkinsonism, possibly essential tremor and R/o Tardive dyskinesia:  Held home [place] Monitored with AIMS and tremors Continue amantadine 100 mg every other day  Dementia vs. neurocognitive impairment vs. delirium Suspected multifactorial considering his history of recurrent seizures, substance abuse, diabetes, hyperlipidemia. Overnight patient was witnessed to have AVH and internally preoccupied x2 evenings now, however today patient was alert and  oriented x3 for the first time.  Delirium precautions, bowel regimen, melatonin 3 mg qHS Modifiable risk factors management per below Meds per above Continue donepezil for memory issues. Continue Depakote 1000 mg 750 mg twice daily given mildly elevated ammonia in the context of possible sundowning 2/2 neurocognitive impairment  Follow-up repeat Depakote level, given likely lab error  Other medical conditions: Seizure disorder:  Continue Depakote DR 750 mg BID CONTINUE lacosamide 150 mg BID CONTINUE Keppra 500 mg BID.   Continue rosuvastatin 20 mg qHS for hyperlipidemia. Continue Farxiga 10 mg daily for DM. H/o CKD?:  Held home finerenone H/o COPD?: Held home Symbicort, Spiriva Respimat  Dispo: Pending as patient does not want to return to the boardinghouse. Stated that his niece/sister "Na-Na" lives in [place], but he has no way to contact her. Stated that he has no other family anywhere.  We will work with CSW to assess if patient qualifies for ACTT Continued to attempt reaching caregiver to assess patient's baseline  Safety, monitoring and disposition planning: The patient was seen and evaluated on the unit.  The patient's chart was reviewed and nursing notes were reviewed.  The patient's case was discussed in multidisciplinary team meeting.  Plan and drug side effects were discussed with patient who was amendable. Social work and case management to assist with discharge planning and identification of hospital follow-up needs prior to discharge. Discharge Concerns: Need to establish a safety plan; Medication compliance and effectiveness Discharge Goals: Return home with outpatient referrals for mental health follow-up including medication management/psychotherapy Short Term Goals: Ability to identify triggers associated with substance abuse/mental health issues will improve Long Term Goals: Improvement in symptoms so as ready for discharge Safety and  Monitoring: Involuntary admission to inpatient psychiatric unit for safety, stabilization and treatment Daily contact with patient to assess and evaluate symptoms and progress in treatment and medical management Patient's case to be discussed in multi-disciplinary team meeting Observation Level : q15 minute checks Vital signs:  q12 hours Precautions: suicide I certify that inpatient services furnished can reasonably be expected to improve the patient's condition.     Signed: Lindell Spar, NP, pmhnp, fnp-bc. 01/23/2021, 9:48 AM  Psychiatrist Patient ID: Stefanie Libel, male   DOB: [birth year], 51 y.o.   MRN: [id number]

## 2021-01-23 NOTE — Progress Notes (Signed)
Adult Psychoeducational Group Note  Date:  01/23/2021 Time:  1:26 AM  Group Topic/Focus:  Wrap-Up Group:   The focus of this group is to help patients review their daily goal of treatment and discuss progress on daily workbooks.  Participation Level:  Minimal  Participation Quality:  Appropriate  Affect:  Appropriate  Cognitive:  Lacking  Insight: Improving  Engagement in Group:  Limited  Modes of Intervention:  Discussion  Additional Comments:  Pt attended the evening wrap-up group. Tech introduced the staff for the evening, reminded group of the evening schedule and reminded them to ask for anything they need. Pt reported feeling 10 out of 10 today, with 1 being the worst and 10 being the best.  The pt is currently aligned to their daily goal. Pt subsequently explored their current concerns from the day. Pt reported they had auditory hallucinations. Pt reported they had visual hallucinations. Pt reported they are not currently having any thoughts of harming themselves or others. Pt reported having slept good the previous night. Lastly, pt indicated they are not currently having any pain. Group ended with a reminder of when night medications will be dispensed and the rest of the evening schedule.   Daniel Foley 01/23/2021, 1:26 AM

## 2021-01-23 NOTE — [facility] Counselor (Signed)
CSW provided this patient with homeless shelter resources. ? ? ? ?Rupa Lagan MSW, LCSW ?Clincal Social Worker  ?[facility]  ?

## 2021-01-24 LAB — GLUCOSE, CAPILLARY
Glucose-Capillary: 186 mg/dL — ABNORMAL HIGH (ref 70–99)
Glucose-Capillary: 157 mg/dL — ABNORMAL HIGH (ref 70–99)
Glucose-Capillary: 215 mg/dL — ABNORMAL HIGH (ref 70–99)
Glucose-Capillary: 219 mg/dL — ABNORMAL HIGH (ref 70–99)

## 2021-01-24 LAB — AMMONIA: Ammonia: 23 umol/L (ref 9–35)

## 2021-01-24 NOTE — Progress Notes (Signed)
°   01/24/21 2300  Psych Admission Type (Psych Patients Only)  Admission Status Involuntary  Psychosocial Assessment  Patient Complaints None  Eye Contact Fair  Facial Expression Animated  Affect Appropriate to circumstance  Speech Soft;Slow;Logical/coherent  Interaction Assertive  Motor Activity Other (Comment) (WDL)  Appearance/Hygiene Disheveled  Behavior Characteristics Cooperative  Mood Preoccupied  Aggressive Behavior  Effect No apparent injury  Thought Process  Coherency Concrete thinking  Content Blaming others  Delusions None reported or observed  Perception Hallucinations  Hallucination Auditory;Visual  Judgment Limited  Confusion None  Danger to Self  Current suicidal ideation? Denies  Danger to Others  Danger to Others None reported or observed

## 2021-01-24 NOTE — Progress Notes (Signed)
°   01/24/21 1200  Psych Admission Type (Psych Patients Only)  Admission Status Involuntary  Psychosocial Assessment  Patient Complaints None  Eye Contact Fair  Facial Expression Animated  Affect Appropriate to circumstance  Speech Soft;Slow;Logical/coherent  Interaction Assertive  Motor Activity Other (Comment) (WDL)  Appearance/Hygiene Disheveled  Behavior Characteristics Cooperative  Mood Preoccupied  Aggressive Behavior  Effect No apparent injury  Thought Process  Coherency Concrete thinking  Content Blaming others  Delusions None reported or observed  Perception Hallucinations  Hallucination Auditory;Visual  Judgment Limited  Confusion None  Danger to Self  Current suicidal ideation? Denies  Danger to Others  Danger to Others None reported or observed

## 2021-01-24 NOTE — [facility] Group Notes (Signed)
Adult Psychoeducational Group Note  Date:  01/24/2021 Time:  10:44 AM  Group Topic/Focus:  Goals Group:   The focus of this group is to help patients establish daily goals to achieve during treatment and discuss how the patient can incorporate goal setting into their daily lives to aide in recovery.  Participation Level:  Did Not Attend     Donell Beers 01/24/2021, 10:44 AM

## 2021-01-24 NOTE — Progress Notes (Signed)
[facility] MD Progress Note  01/24/2021 12:51 PM Daniel Foley  MRN: [id number]  CC: Psychosis  Subjective: Daniel Foley reports, "I have been here for 2 weeks, but I have forgotten the reason that made me come to the hospital. My mood is good".  Reason for admission: Daniel Foley is a 51 y.o. male with PPHx of schizophrenia, epilepsy (on Keppra, Depakote, lacosamide) living at a boarding home who presented involuntarily via [employer] after attempting to walk into heavy traffic and was found to be psychotic and responding internally, now admitted for management of acute psychosis.  Daniel Foley   Yesterday's recommendation by the Psychiatry team Continue Zyprexa 5mg  qam and 15mg  qhs   Continue Melatonin 3 mg qHS Continue amantadine 100 mg daily to every other Foley Continue donepezil 5 mg qHS Start Abilify 5 mg qHS  Daily notes: Patient was seen on 500 hall near the phone corner.  Patient appeared disheveled, wearing the same yellow stained shirt for the past few days. His speech was tangential and goal directed. He was partially disoriented, alert and oriented to self, year, month, hospital.  However he was not able to report the reason for admission, city or state. Patient stated that he could live here in the hospital, however discussed with him that the team is working on getting him to a shelter, to which he was amenable to.  Patient was unable to remember where he lived prior to this hospitalization, and continued to perseverate on him living in [place] in the past. He denied medication side effects.  Patient denied SI/HI/AVH, first rank symptoms, and contracted to safety on the unit. Patient was not grossly responding to internal/external stimuli nor made any delusional statements during encounter.   Diagnosis:  Principal Problem:   Schizophrenia ([place]) Active Problems:   Drug-induced parkinsonism ([place])   Delirium superimposed on dementia   Tobacco abuse   Controlled type 2 diabetes mellitus with  hypoglycemia [facility])  Past Psychiatric History: See H&P  Past Medical History:  Past Medical History:  Diagnosis Date   Chest pain 08/15/2020   Chronic kidney disease    Diabetes mellitus without complication ([place])    Neuromuscular disorder ([place])    Observed seizure-like activity ([place]) 06/22/2020   Schizophrenia ([place])    Witnessed seizure-like activity ([place]) 06/21/2020   History reviewed. No pertinent surgical history. Family History:  Family History  Problem Relation Age of Onset   Seizures Brother    Family Psychiatric  History: See H&P  Social History:  Social History   Substance and Sexual Activity  Alcohol Use Yes     Social History   Substance and Sexual Activity  Drug Use Not Currently    Social History   Socioeconomic History   Marital status: Unknown    Spouse name: Not on file   Number of children: Not on file   Years of education: Not on file   Highest education level: Not on file  Occupational History   Not on file  Tobacco Use   Smoking status: Every Foley    Packs/Foley: 0.25    Years: 0.50    Pack years: 0.Foley    Types: Cigarettes   Smokeless tobacco: Never  Vaping Use   Vaping Use: Never used  Substance and Sexual Activity   Alcohol use: Yes   Drug use: Not Currently   Sexual activity: Not Currently    Birth control/protection: Condom  Other Topics Concern   Not on file  Social History Narrative  Not on file   Social Determinants of Health   Financial Resource Strain: Not on file  Food Insecurity: Not on file  Transportation Needs: Not on file  Physical Activity: Not on file  Stress: Not on file  Social Connections: Not on file   Additional Social History:   Sleep: Good   Appetite: Good.  Current Medications: Current Facility-Administered Medications  Medication Dose Route Frequency Provider Last Rate Last Admin   acetaminophen (TYLENOL) tablet 650 mg  650 mg Oral Q6H PRN Starkes-Perry, Gayland Curry, FNP       alum & mag  hydroxide-simeth (MAALOX/MYLANTA) 200-200-20 MG/5ML suspension 30 mL  30 mL Oral Q4H PRN Starkes-Perry, Gayland Curry, FNP       amantadine (SYMMETREL) capsule 100 mg  100 mg Oral Lowella Fairy, DO   100 mg at 01/23/21 [id number]   ARIPiprazole (ABILIFY) tablet 5 mg  5 mg Oral QHS Massengill, Ovid Curd, MD   5 mg at 01/23/21 2108   dapagliflozin propanediol (FARXIGA) tablet 10 mg  10 mg Oral Daily Suella Broad, FNP   10 mg at 01/24/21 0818   divalproex (DEPAKOTE) DR tablet 750 mg  750 mg Oral BID Massengill, Ovid Curd, MD   750 mg at 01/24/21 0819   docusate sodium (COLACE) capsule 100 mg  100 mg Oral BID Massengill, Ovid Curd, MD   100 mg at 01/24/21 0819   donepezil (ARICEPT) tablet 5 mg  5 mg Oral QHS Merrily Brittle, DO   5 mg at 01/23/21 2108   insulin aspart (novoLOG) injection 0-9 Units  0-9 Units Subcutaneous TID WC Harlow Asa, MD   2 Units at 01/24/21 1204   lacosamide (VIMPAT) tablet 150 mg  150 mg Oral BID Merrily Brittle, DO   150 mg at 01/24/21 0818   levETIRAcetam (KEPPRA) tablet 500 mg  500 mg Oral BID Suella Broad, FNP   500 mg at 01/24/21 0818   magnesium hydroxide (MILK OF MAGNESIA) suspension 30 mL  30 mL Oral Daily PRN Suella Broad, FNP       melatonin tablet 3 mg  3 mg Oral QHS Merrily Brittle, DO   3 mg at 01/23/21 2111   nicotine (NICODERM CQ - dosed in mg/24 hours) patch 14 mg  14 mg Transdermal Daily Suella Broad, FNP   14 mg at 01/24/21 0818   OLANZapine zydis (ZYPREXA) disintegrating tablet 15 mg  15 mg Oral QHS Merrily Brittle, DO   15 mg at 01/23/21 2108   OLANZapine zydis (ZYPREXA) disintegrating tablet 5 mg  5 mg Oral Q8H PRN Massengill, Ovid Curd, MD   5 mg at 01/20/21 1600   And   ziprasidone (GEODON) injection 20 mg  20 mg Intramuscular PRN Massengill, Ovid Curd, MD       OLANZapine zydis (ZYPREXA) disintegrating tablet 5 mg  5 mg Oral Daily Merrily Brittle, DO   5 mg at 01/24/21 [id number]   rosuvastatin (CRESTOR) tablet 20 mg  20 mg Oral QPM Merrily Brittle, DO   20 mg at 01/23/21 1728   Lab Results:  Results for orders placed or performed during the hospital encounter of 01/11/21 (from the past 48 hour(s))  Glucose, capillary     Status: Abnormal   Collection Time: 01/22/21  5:03 PM  Result Value Ref Range   Glucose-Capillary 215 (H) 70 - 99 mg/dL    Comment: Glucose reference range applies only to samples taken after fasting for at least 8 hours.  Glucose, capillary  Status: Abnormal   Collection Time: 01/22/21  7:32 PM  Result Value Ref Range   Glucose-Capillary 223 (H) 70 - 99 mg/dL    Comment: Glucose reference range applies only to samples taken after fasting for at least 8 hours.  Glucose, capillary     Status: Abnormal   Collection Time: 01/23/21  5:49 AM  Result Value Ref Range   Glucose-Capillary 160 (H) 70 - 99 mg/dL    Comment: Glucose reference range applies only to samples taken after fasting for at least 8 hours.  Valproic acid level     Status: None   Collection Time: 01/23/21  6:37 AM  Result Value Ref Range   Valproic Acid Lvl 54 50.0 - 100.0 ug/mL    Comment: Performed at [facility], [place] [address], [place], [place] [address]  Glucose, capillary     Status: Abnormal   Collection Time: 01/23/21 11:51 AM  Result Value Ref Range   Glucose-Capillary 223 (H) 70 - 99 mg/dL    Comment: Glucose reference range applies only to samples taken after fasting for at least 8 hours.  Glucose, capillary     Status: Abnormal   Collection Time: 01/23/21  5:18 PM  Result Value Ref Range   Glucose-Capillary 208 (H) 70 - 99 mg/dL    Comment: Glucose reference range applies only to samples taken after fasting for at least 8 hours.  Glucose, capillary     Status: Abnormal   Collection Time: 01/23/21  7:57 PM  Result Value Ref Range   Glucose-Capillary 251 (H) 70 - 99 mg/dL    Comment: Glucose reference range applies only to samples taken after fasting for at least 8 hours.   Comment 1 Notify RN   Glucose,  capillary     Status: Abnormal   Collection Time: 01/24/21  5:59 AM  Result Value Ref Range   Glucose-Capillary 186 (H) 70 - 99 mg/dL    Comment: Glucose reference range applies only to samples taken after fasting for at least 8 hours.   Comment 1 Notify RN    Comment 2 Document in Chart   Glucose, capillary     Status: Abnormal   Collection Time: 01/24/21 12:00 PM  Result Value Ref Range   Glucose-Capillary 157 (H) 70 - 99 mg/dL    Comment: Glucose reference range applies only to samples taken after fasting for at least 8 hours.   Blood Alcohol level:  Lab Results  Component Value Date   ETH <10 01/08/2021   ETH <10 [id number]   Metabolic Disorder Labs: Lab Results  Component Value Date   HGBA1C 5.8 (H) 01/11/2021   MPG 119.76 01/11/2021   MPG 131.24 08/16/2020   Lab Results  Component Value Date   PROLACTIN Foley.6 01/19/2021   Lab Results  Component Value Date   CHOL 152 01/11/2021   TRIG 130 01/11/2021   HDL 50 01/11/2021   CHOLHDL 3.0 01/11/2021   VLDL 26 01/11/2021   LDLCALC 76 01/11/2021   Physical Findings: AIMS:  Facial and Oral Movements Muscles of Facial Expression: None, normal Lips and Perioral Area: Minimal Jaw: None, normal Tongue: None, normal, Extremity Movements Upper (arms, wrists, hands, fingers): None, normal Lower (legs, knees, ankles, toes): None, normal,  Trunk Movements Neck, shoulders, hips: None, normal,  Overall Severity Severity of abnormal movements (highest score from questions above): None, normal Incapacitation due to abnormal movements: None, normal Patient's awareness of abnormal movements (rate only patient's report): No Awareness,  Dental Status  Current problems with teeth and/or dentures?: Yes Does patient usually wear dentures?: No  CIWA:    COWS:     Musculoskeletal: Strength & Muscle Tone: within normal limits Gait & Station: NA Patient leans: N/A  Psychiatric Specialty Exam:  Presentation  General Appearance:  Disheveled  Eye Contact:Fair  Speech:Clear and Coherent; Normal Rate  Speech Volume:Normal  Handedness:Right  Mood and Affect  Mood: Euthymic  Affect: Constricted, and appropriate, congruent  Thought Process  Thought Processes: goal directed, coherent for the majority of the conversation  Descriptions of Associations: Circumstantial  Orientation: Self, hospital, year, January.  Not year or situation or city or state  Thought Content: Patient denied SI/HI/AVH, first rank symptoms, and contracted to safety on the unit. Patient was not grossly responding to internal/external stimuli.  History of Schizophrenia/Schizoaffective disorder:Yes  Duration of Psychotic Symptoms:Greater than six months  Hallucinations:Hallucinations: None   Ideas of Reference:None  Suicidal Thoughts:Suicidal Thoughts: No   Homicidal Thoughts:Homicidal Thoughts: No   Sensorium  Memory:Immediate Fair; Recent Poor; Remote Poor  Judgment:Fair  Insight:Lacking  Executive Functions  Concentration:Fair  Attention Span:Fair  [place]  Psychomotor Activity  Psychomotor Activity:Psychomotor Activity: Tremor; Extrapyramidal Side Effects (EPS) Extrapyramidal Side Effects (EPS): Parkinsonism AIMS Completed?: No   Assets  Assets:Leisure Time  Sleep  Sleep: 6.25 hours   Physical Exam: Physical Exam Vitals and nursing note reviewed.  Constitutional:      General: He is awake. He is not in acute distress.    Appearance: He is not ill-appearing or diaphoretic.  HENT:     Head: Normocephalic.  Pulmonary:     Effort: Pulmonary effort is normal.  Neurological:     General: No focal deficit present.     Mental Status: He is alert. He is disoriented.     Motor: Tremor present.  Psychiatric:        Behavior: Behavior is cooperative.   Review of Systems  Respiratory:  Negative for shortness of breath.   Cardiovascular:  Negative for chest  pain.  Gastrointestinal:  Negative for nausea and vomiting.  Neurological:  Negative for dizziness and headaches.   Blood pressure (!) 117/91, pulse 99, temperature 97.8 F (36.6 C), temperature source Oral, resp. rate 14, height 6\' 2"  (1.88 m), weight 91.6 kg, SpO2 98 %. Body mass index is 25.94 kg/m.  Treatment Plan & Assessment Summary:  Continue inpatient hospitalization.  Will continue today 01/24/2021 plan as below except where it is noted.   Principal Problem:   Schizophrenia ([place]) Active Problems:   Drug-induced parkinsonism ([place])   Delirium superimposed on dementia   Tobacco abuse   Controlled type 2 diabetes mellitus with hypoglycemia ([facility])  Daniel Foley is a 51 y.o. male with PPHx of schizophrenia, seizures (on Keppra, Depakote, lacosamide) living at a boarding home who presented involuntarily via [employer] after attempting to walk into heavy traffic and was found to be psychotic and responding internally, now admitted for management of acute psychosis exacerbated by recent cocaine use.  [place] Foley Foley   PLAN: Schizophrenia by history Continue Zyprexa 5mg  qAM & 15mg  qHS Continue Abilify 5 mg qHS   Suspected drug induced parkinsonism, possibly essential tremor and R/o Tardive dyskinesia:  Held home [place] Monitored with AIMS and tremors Continue amantadine 100 mg every other Foley  Dementia, delirium Suspected multifactorial considering his history of recurrent seizures, substance abuse, diabetes, hyperlipidemia. Delirium precautions, bowel regimen, melatonin 3 mg qHS Modifiable risk factors management per below Meds per above Continue  donepezil 5 mg qHS for memory loss Continue Depakote 750 mg twice daily given mildly elevated ammonia in the context of possible sundowning 2/2 neurocognitive impairment   Other medical conditions: Seizure disorder:  Continue Depakote DR 750 mg BID CONTINUE lacosamide 150 mg BID CONTINUE Keppra 500 mg BID.   Continue rosuvastatin 20 mg qHS  for hyperlipidemia. Continue Farxiga 10 mg daily for DM. H/o CKD?:  Held home finerenone H/o COPD?: Held home Symbicort, Spiriva Respimat  Dispo: ACTT team appointment 01/25/21 at [facility] 01/25/21  Safety, monitoring and disposition planning: The patient was seen and evaluated on the unit.  The patient's chart was reviewed and nursing notes were reviewed.  The patient's case was discussed in multidisciplinary team meeting.  Plan and drug side effects were discussed with patient who was amendable. Social work and case management to assist with discharge planning and identification of hospital follow-up needs prior to discharge. Discharge Concerns: Need to establish a safety plan; Medication compliance and effectiveness Discharge Goals: Return home with outpatient referrals for mental health follow-up including medication management/psychotherapy Short Term Goals: Ability to identify triggers associated with substance abuse/mental health issues will improve Long Term Goals: Improvement in symptoms so as ready for discharge Safety and Monitoring: Involuntary admission to inpatient psychiatric unit for safety, stabilization and treatment Daily contact with patient to assess and evaluate symptoms and progress in treatment and medical management Patient's case to be discussed in multi-disciplinary team meeting Observation Level : q15 minute checks Vital signs:  q12 hours Precautions: suicide I certify that inpatient services furnished can reasonably be expected to improve the patient's condition.     Signed: Merrily Brittle, DO Psychiatry Resident, PGY-1 [facility] 01/24/2021, 2:57 PM

## 2021-01-24 NOTE — Group Note (Signed)
Recreation Therapy Group Note   Group Topic:Health and Wellness  Group Date: 01/24/2021 Start Time: 1000 End Time: 1035 Facilitators: Caroll Rancher, LRT,CTRS Location: 500 Hall Dayroom   Goal Area(s) Addresses:  Patient will verbalize benefit of exercise during group session. Patient will verbalize an exercise that can be completed in their hospital room during admission. Patient will identify an exercise that can be completed post d/c.  Group Description:  Exercise.  LRT and patients discussed the importance of exercise and benefits.  LRT then led group in a series of stretches to loosen up.  Each patient will take turns leading the group in some sort of movement of their choice.  The exercise will last at least 30 minutes.  Patients were encouraged to take breaks and get water as needed.   Affect/Mood: Appropriate   Participation Level: None   Participation Quality: Independent   Behavior: Appropriate   Speech/Thought Process: Oriented   Insight: Poor   Judgement: Poor   Modes of Intervention: Music   Patient Response to Interventions:  Attentive   Education Outcome:  Acknowledges education and In group clarification offered    Clinical Observations/Individualized Feedback: Pt came into group late.  Pt did not participate in the activity.  Pt came in asking for snacks for which he was redirected.      Plan: Continue to engage patient in RT group sessions 2-3x/week.   Caroll Rancher, LRT,CTRS 01/24/2021 10:56 AM

## 2021-01-24 NOTE — Progress Notes (Signed)
Inpatient Diabetes Program Recommendations  AACE/ADA: New Consensus Statement on Inpatient Glycemic Control (2015)  Target Ranges:  Prepandial:   less than 140 mg/dL      Peak postprandial:   less than 180 mg/dL (1-2 hours)      Critically ill patients:  140 - 180 mg/dL   Lab Results  Component Value Date   GLUCAP 186 (H) 01/24/2021   HGBA1C 5.8 (H) 01/11/2021    Review of Glycemic Control  Latest Reference Range & Units 01/23/21 05:49 01/23/21 11:51 01/23/21 17:18 01/23/21 19:57 01/24/21 05:59  Glucose-Capillary 70 - 99 mg/dL 160 (H) 223 (H) 208 (H) 251 (H) 186 (H)   Diabetes history: DM 2 Outpatient Diabetes medications: Farxiga 10 QD, Rybelsus 7 mg QD Current orders for Inpatient glycemic control:  Novolog 0-9 units tid Farxiga 10 mg Daily  Inpatient Diabetes Program Recommendations:   -  Increase Novolog scale to "moderate" 0-15 units tid  Thanks,  Tama Headings RN, MSN, BC-ADM Inpatient Diabetes Coordinator Team Pager [phone] (8a-5p)

## 2021-01-25 ENCOUNTER — Encounter (HOSPITAL_COMMUNITY): Payer: Self-pay

## 2021-01-25 LAB — GLUCOSE, CAPILLARY
Glucose-Capillary: 164 mg/dL — ABNORMAL HIGH (ref 70–99)
Glucose-Capillary: 135 mg/dL — ABNORMAL HIGH (ref 70–99)

## 2021-01-25 MED ORDER — ARIPIPRAZOLE 5 MG PO TABS: 5.0000 mg | ORAL_TABLET | Freq: Every day | ORAL | 0 refills | Status: AC

## 2021-01-25 MED ORDER — DONEPEZIL HCL 5 MG PO TABS: 5.0000 mg | ORAL_TABLET | Freq: Every day | ORAL | 0 refills | Status: AC

## 2021-01-25 MED ORDER — DIVALPROEX SODIUM 250 MG PO DR TAB: 750.0000 mg | DELAYED_RELEASE_TABLET | Freq: Two times a day (BID) | ORAL | 0 refills | Status: AC

## 2021-01-25 MED ORDER — NICOTINE 14 MG/24HR TD PT24: 14.0000 mg | MEDICATED_PATCH | Freq: Every day | TRANSDERMAL | 0 refills | Status: AC

## 2021-01-25 MED ORDER — LEVETIRACETAM 500 MG PO TABS: 500.0000 mg | ORAL_TABLET | Freq: Two times a day (BID) | ORAL | 0 refills | Status: AC

## 2021-01-25 MED ORDER — OLANZAPINE 5 MG PO TBDP: 5.0000 mg | ORAL_TABLET | Freq: Every day | ORAL | 0 refills | Status: AC

## 2021-01-25 MED ORDER — AMANTADINE HCL 100 MG PO CAPS: 100.0000 mg | ORAL_CAPSULE | ORAL | 0 refills | Status: AC

## 2021-01-25 MED ORDER — OLANZAPINE 15 MG PO TBDP: 15.0000 mg | ORAL_TABLET | Freq: Every day | ORAL | 0 refills | Status: AC

## 2021-01-25 MED ORDER — DAPAGLIFLOZIN PROPANEDIOL 10 MG PO TABS: 10.0000 mg | ORAL_TABLET | Freq: Every day | ORAL | 0 refills | Status: AC

## 2021-01-25 MED ORDER — ROSUVASTATIN CALCIUM 20 MG PO TABS: 20.0000 mg | ORAL_TABLET | Freq: Every day | ORAL | 0 refills | Status: AC

## 2021-01-25 MED ORDER — MELATONIN 3 MG PO TABS
3.0000 mg | ORAL_TABLET | Freq: Every day | ORAL | 0 refills | Status: AC
Start: 2021-01-25 — End: ?

## 2021-01-25 MED ORDER — LACOSAMIDE 150 MG PO TABS: 150.0000 mg | ORAL_TABLET | Freq: Two times a day (BID) | ORAL | 0 refills | Status: AC

## 2021-01-25 MED ORDER — DOCUSATE SODIUM 100 MG PO CAPS: 100.0000 mg | ORAL_CAPSULE | Freq: Two times a day (BID) | ORAL | 0 refills | Status: AC
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
## 2021-01-25 NOTE — Progress Notes (Signed)
°   01/25/21 0545  Sleep  Number of Hours 7.25

## 2021-01-25 NOTE — Progress Notes (Signed)
Pt was encouraged but didn't attend orientation/goals group. ?

## 2021-01-25 NOTE — Progress Notes (Signed)
Pt discharged to lobby. Pt was stable and appreciative at that time. All papers and prescriptions were given and valuables returned. Verbal understanding expressed. Denies SI/HI and A/VH. Pt given opportunity to express concerns and ask questions.  

## 2021-01-25 NOTE — BH IP Treatment Plan (Signed)
Interdisciplinary Treatment and Diagnostic Plan Update  01/25/2021 Elliot GaultGerald Bitterman MRN: [id number]  Principal Diagnosis: Schizophrenia [facility](HCC)  Secondary Diagnoses: Principal Problem:   Schizophrenia (HCC) Active Problems:   Tobacco abuse   Controlled type 2 diabetes mellitus with hypoglycemia (HCC)   Drug-induced parkinsonism (HCC)   Delirium superimposed on dementia   Current Medications:  Current Facility-Administered Medications  Medication Dose Route Frequency Provider Last Rate Last Admin   acetaminophen (TYLENOL) tablet 650 mg  650 mg Oral Q6H PRN Starkes-Perry, Juel Burrowakia S, FNP       alum & mag hydroxide-simeth (MAALOX/MYLANTA) 200-200-20 MG/5ML suspension 30 mL  30 mL Oral Q4H PRN Starkes-Perry, Juel Burrowakia S, FNP       amantadine (SYMMETREL) capsule 100 mg  100 mg Oral Guillermina CityQODAY Nguyen, Julie, DO   100 mg at 01/25/21 1214   ARIPiprazole (ABILIFY) tablet 5 mg  5 mg Oral QHS Massengill, Harrold DonathNathan, MD   5 mg at 01/24/21 2039   dapagliflozin propanediol (FARXIGA) tablet 10 mg  10 mg Oral Daily Maryagnes AmosStarkes-Perry, Takia S, FNP   10 mg at 01/25/21 0803   divalproex (DEPAKOTE) DR tablet 750 mg  750 mg Oral BID Massengill, Harrold DonathNathan, MD   750 mg at 01/25/21 [id number]   docusate sodium (COLACE) capsule 100 mg  100 mg Oral BID Massengill, Harrold DonathNathan, MD   100 mg at 01/25/21 0803   donepezil (ARICEPT) tablet 5 mg  5 mg Oral QHS Princess BruinsNguyen, Julie, DO   5 mg at 01/24/21 2039   insulin aspart (novoLOG) injection 0-9 Units  0-9 Units Subcutaneous TID WC Bartholomew CrewsSingleton, Amy E, MD   1 Units at 01/25/21 1214   lacosamide (VIMPAT) tablet 150 mg  150 mg Oral BID Princess BruinsNguyen, Julie, DO   150 mg at 01/25/21 0802   levETIRAcetam (KEPPRA) tablet 500 mg  500 mg Oral BID Maryagnes AmosStarkes-Perry, Takia S, FNP   500 mg at 01/25/21 0804   magnesium hydroxide (MILK OF MAGNESIA) suspension 30 mL  30 mL Oral Daily PRN Maryagnes AmosStarkes-Perry, Takia S, FNP       melatonin tablet 3 mg  3 mg Oral QHS Princess BruinsNguyen, Julie, DO   3 mg at 01/24/21 2039   nicotine (NICODERM CQ - dosed in  mg/24 hours) patch 14 mg  14 mg Transdermal Daily Maryagnes AmosStarkes-Perry, Takia S, FNP   14 mg at 01/25/21 0802   OLANZapine zydis (ZYPREXA) disintegrating tablet 15 mg  15 mg Oral QHS Princess BruinsNguyen, Julie, DO   15 mg at 01/24/21 2039   OLANZapine zydis (ZYPREXA) disintegrating tablet 5 mg  5 mg Oral Q8H PRN Massengill, Harrold DonathNathan, MD   5 mg at 01/20/21 1600   And   ziprasidone (GEODON) injection 20 mg  20 mg Intramuscular PRN Massengill, Harrold DonathNathan, MD       OLANZapine zydis (ZYPREXA) disintegrating tablet 5 mg  5 mg Oral Daily Princess BruinsNguyen, Julie, DO   5 mg at 01/25/21 0803   rosuvastatin (CRESTOR) tablet 20 mg  20 mg Oral QPM Princess BruinsNguyen, Julie, DO   20 mg at 01/24/21 1735   Current Outpatient Medications  Medication Sig Dispense Refill   albuterol (VENTOLIN HFA) 108 (90 Base) MCG/ACT inhaler Inhale 2 puffs into the lungs every 4 (four) hours as needed for shortness of breath.     [START ON 01/27/2021] amantadine (SYMMETREL) 100 MG capsule Take 1 capsule (100 mg total) by mouth every other day. 15 capsule 0   ARIPiprazole (ABILIFY) 5 MG tablet Take 1 tablet (5 mg total) by mouth at bedtime. 30 tablet 0  budesonide-formoterol (SYMBICORT) 160-4.5 MCG/ACT inhaler Inhale 2 puffs into the lungs 2 (two) times daily.     dapagliflozin propanediol (FARXIGA) 10 MG TABS tablet Take 1 tablet (10 mg total) by mouth daily. 30 tablet 0   divalproex (DEPAKOTE) 250 MG DR tablet Take 3 tablets (750 mg total) by mouth 2 (two) times daily. 180 tablet 0   docusate sodium (COLACE) 100 MG capsule Take 1 capsule (100 mg total) by mouth 2 (two) times daily. 60 capsule 0   donepezil (ARICEPT) 5 MG tablet Take 1 tablet (5 mg total) by mouth at bedtime. 30 tablet 0   Lacosamide 150 MG TABS Take 1 tablet (150 mg total) by mouth 2 (two) times daily. 60 tablet 0   levETIRAcetam (KEPPRA) 500 MG tablet Take 1 tablet (500 mg total) by mouth 2 (two) times daily. 60 tablet 0   melatonin 3 MG TABS tablet Take 1 tablet (3 mg total) by mouth at bedtime.  0    nicotine (NICODERM CQ - DOSED IN MG/24 HOURS) 14 mg/24hr patch Place 1 patch (14 mg total) onto the skin daily. 28 patch 0   OLANZapine zydis (ZYPREXA) 15 MG disintegrating tablet Take 1 tablet (15 mg total) by mouth at bedtime. 30 tablet 0   [START ON 01/26/2021] OLANZapine zydis (ZYPREXA) 5 MG disintegrating tablet Take 1 tablet (5 mg total) by mouth daily. 30 tablet 0   rosuvastatin (CRESTOR) 20 MG tablet Take 1 tablet (20 mg total) by mouth daily. 30 tablet 0   Tiotropium Bromide Monohydrate (SPIRIVA RESPIMAT) 1.25 MCG/ACT AERS Inhale 2 puffs into the lungs daily.     PTA Medications: No medications prior to admission.    Patient Stressors: Marital or family conflict   Medication change or noncompliance    Patient Strengths: Automotive engineer for treatment/growth   Treatment Modalities: Medication Management, Group therapy, Case management,  1 to 1 session with clinician, Psychoeducation, Recreational therapy.   Physician Treatment Plan for Primary Diagnosis: Schizophrenia (HCC) Long Term Goal(s):     Short Term Goals:    Medication Management: Evaluate patient's response, side effects, and tolerance of medication regimen.  Therapeutic Interventions: 1 to 1 sessions, Unit Group sessions and Medication administration.  Evaluation of Outcomes: Adequate for Discharge  Physician Treatment Plan for Secondary Diagnosis: Principal Problem:   Schizophrenia (HCC) Active Problems:   Tobacco abuse   Controlled type 2 diabetes mellitus with hypoglycemia (HCC)   Drug-induced parkinsonism (HCC)   Delirium superimposed on dementia  Long Term Goal(s):     Short Term Goals:       Medication Management: Evaluate patient's response, side effects, and tolerance of medication regimen.  Therapeutic Interventions: 1 to 1 sessions, Unit Group sessions and Medication administration.  Evaluation of Outcomes: Adequate for Discharge   RN Treatment Plan for Primary  Diagnosis: Schizophrenia (HCC) Long Term Goal(s): Knowledge of disease and therapeutic regimen to maintain health will improve  Short Term Goals: Ability to participate in decision making will improve, Ability to verbalize feelings will improve, and Ability to identify and develop effective coping behaviors will improve  Medication Management: RN will administer medications as ordered by provider, will assess and evaluate patient's response and provide education to patient for prescribed medication. RN will report any adverse and/or side effects to prescribing provider.  Therapeutic Interventions: 1 on 1 counseling sessions, Psychoeducation, Medication administration, Evaluate responses to treatment, Monitor vital signs and CBGs as ordered, Perform/monitor CIWA, COWS, AIMS and Fall Risk screenings as ordered,  Perform wound care treatments as ordered.  Evaluation of Outcomes: Adequate for Discharge   LCSW Treatment Plan for Primary Diagnosis: Schizophrenia [facility]) Long Term Goal(s): Safe transition to appropriate next level of care at discharge, Engage patient in therapeutic group addressing interpersonal concerns.  Short Term Goals: Engage patient in aftercare planning with referrals and resources, Increase emotional regulation, and Facilitate acceptance of mental health diagnosis and concerns  Therapeutic Interventions: Assess for all discharge needs, 1 to 1 time with Social worker, Explore available resources and support systems, Assess for adequacy in community support network, Educate family and significant other(s) on suicide prevention, Complete Psychosocial Assessment, Interpersonal group therapy.  Evaluation of Outcomes: Adequate for Discharge   Progress in Treatment: Attending groups: Yes. Participating in groups: Yes. Taking medication as prescribed: Yes. Toleration medication: Yes. Family/Significant other contact made: Yes, individual(s) contacted:  group home Patient  understands diagnosis: Yes. Discussing patient identified problems/goals with staff: Yes. Medical problems stabilized or resolved: Yes. Denies suicidal/homicidal ideation: Yes. Issues/concerns per patient self-inventory: Yes. and No. Other: None  New problem(s) identified: No, Describe:  None  New Short Term/Long Term Goal(s):medication stabilization, elimination of SI thoughts, development of comprehensive mental wellness plan.   Patient Goals:    Discharge Plan or Barriers:  Pt will f/u with ACTT team  Reason for Continuation of Hospitalization: Medication stabilization  Estimated Length of Stay: 3-5 days   Scribe for Treatment Team: Chrys Racer 01/25/2021 3:52 PM

## 2021-01-25 NOTE — BHH Suicide Risk Assessment (Addendum)
[facility] Discharge Suicide Risk Assessment  Principal Problem: Schizophrenia [facility]) Discharge Diagnoses:  Principal Problem:   Schizophrenia ([place]) Active Problems:   Drug-induced parkinsonism ([place])   Delirium superimposed on dementia   Tobacco abuse   Controlled type 2 diabetes mellitus with hypoglycemia [facility])   HOSPITAL COURSE: Daniel Foley is a 51 y.o. male with PPHx of schizophrenia, seizures (on Keppra, Depakote, lacosamide) living at a boarding home who presented involuntarily via [employer] after attempting to walk into heavy traffic and was found to be psychotic and responding internally, now admitted for management of acute psychosis exacerbated by recent cocaine use.  Psychiatric diagnoses provided upon initial assessment: Principal Problem:   Schizophrenia ([place]) Active Problems:   Drug-induced parkinsonism ([place])   Delirium superimposed on dementia   Tobacco abuse   Controlled type 2 diabetes mellitus with hypoglycemia (HCC)   Patient's psychiatric medications were adjusted on admission:  Discontinued home Haldol 5 mg daily - due to EPS  Discontinued home Austedo 6 mg nightly - unsure if pt was getting/taking this medication and his movements appeared ot be more parkinsonian than TD Decreased Cogentin 1 mg to 0.5 mg daily x2 days, then discontinuing - due to confusion and cognitive impairment  Started amantadine 100 mg bid Started Zyprexa 5 mg twice daily - for psychosis Seizure disorder: Continued home Depakote DR 1000 mg BID, re-start lacosamide 150 mg BID, continue Keppra 500 mg BID  During the hospitalization, other adjustments were made to the patient's psychiatric medication regimen:  Discontinued Cogentin - due to delirium and inefficiency for EPS Decreased home Depakote DR 1000 mg BID for seizures to 750 mg BID due to elevated ammonia level. Ammonia level wnl at 750 mg bid  Changed amantadine 100 mg daily to every other day, due to worsening psychosis at higher dose   Increased zyprexa to 5 mg in the morning and 15 mg qhs  Started Abilify 5 mg qHS - psychosis Started donepezil 5 mg qHS for cognitive impairment   Events during hospitalizations: Sundowning in the evening  FOLLOW-UP Considerations: Labs: CBC, CMP, Lipid panel, A1c, EKG  Other medical conditions: Seizure disorder: decreased home Depakote DR to 750 mg BID, re-start lacosamide 150 mg BID, continue Keppra 500 mg BID HLD: Continued home rosuvastatin 20 mg qHS T2DM: Continued home Farxiga 10 mg daily, did not restart home semaglutide H/o CKD?:  Held home finerenone H/o COPD: Held home Symbicort, Spiriva Respimat    During the patient's hospitalization, he had extensive initial psychiatric evaluation and follow-up psychiatric evaluations daily.  Gradually, the patient started adjusting to the milieu. The patient was evaluated each day by a clinical provider to ascertain response to treatment. Improvement was noted by the patient's report of decreasing symptoms, improved sleep and appetite, affect, medication tolerance, behavior, and participation in unit programming. Patient was asked each day to complete a self inventory noting mood, mental status, pain, new symptoms, anxiety and concerns.   Symptoms were reported as significantly decreased or resolved completely by discharge.  The patient reported that their mood was stable.  The patient denied having suicidal thoughts for more than 48 hours prior to discharge. Patient denied having homicidal thoughts. Patient denied having auditory hallucinations. Patient denied any visual hallucinations or other symptoms of psychosis.  The patient was motivated to continue taking medication with a goal of continued improvement in mental health.   The patient reported their target psychiatric symptoms of visual and auditory hallucinations, memory impairment, tremors, delusions, sleep responded well to the psychiatric medications, and  the patient reports  overall benefit other psychiatric hospitalization. Supportive psychotherapy was provided to the patient. The patient also participated in regular group therapy while hospitalized. Coping skills, problem solving as well as relaxation therapies were also part of the unit programming.  Labs were reviewed with the patient, and abnormal results were discussed with the patient.  The patient was able to verbalize their individual safety plan to this provider.  Patient was instructed to take all prescribed medications as recommended. Report any side effects or adverse reactions to your outpatient psychiatrist. Patient was instructed to abstain from alcohol and illegal drugs while on prescription medications. In the event of worsening symptoms, patient was instructed to call the crisis hotline, 911, or go to the nearest emergency department for evaluation and treatment.  Musculoskeletal: Strength & Muscle Tone: within normal limits Gait & Station: normal Patient leans: N/A  Psychiatric Specialty Exam Presentation  General Appearance: Disheveled; Casual  Eye Contact:Fair  Speech:Clear and Coherent  Speech Volume:Normal  Handedness:Right   Mood and Affect  Mood:Euthymic   Duration of Depression Symptoms: No data recorded  Affect:Congruent; Constricted   Thought Process  Thought Processes:Goal Directed  Descriptions of Associations:Tangential  Orientation:Partial (A&O to self, hospital, state, month. Incorrectly stated 2028, and declined on what city)  Thought Content:Illogical; Paranoid Ideation; Perseveration; Rumination; Tangential   History of Schizophrenia/Schizoaffective disorder:Yes   Duration of Psychotic Symptoms:Greater than six months  Hallucinations:Hallucinations: None Description of Auditory Hallucinations: Denied Description of Visual Hallucinations: Denied  Ideas of Reference:None   Suicidal Thoughts:Suicidal Thoughts: No  Homicidal Thoughts:Homicidal  Thoughts: No   Sensorium  Memory:Immediate Fair; Recent Poor; Remote Poor  Judgment:Fair  Insight:Lacking   Executive Functions  Concentration:Fair  Attention Span:Fair  Recall:Poor   Fund of Knowledge:Fair  Language:Fair   Psychomotor Activity  Psychomotor Activity:Psychomotor Activity: Extrapyramidal Side Effects (EPS); Tremor Extrapyramidal Side Effects (EPS): Parkinsonism AIMS Completed?: No   Assets  Assets:Leisure Time   Sleep  Sleep:Sleep: Good    Physical Exam: Body mass index is 25.94 kg/m. Blood pressure (!) 112/54, pulse (!) 107, temperature 98.2 F (36.8 C), temperature source Oral, resp. rate 16, height 6\' 2"  (1.88 m), weight 91.6 kg, SpO2 98 %.   Physical Exam Vitals and nursing note reviewed.  Constitutional:      General: He is awake. He is not in acute distress.    Appearance: He is normal weight. He is not ill-appearing or diaphoretic.  HENT:     Head: Normocephalic.  Pulmonary:     Effort: Pulmonary effort is normal.  Neurological:     General: No focal deficit present.     Mental Status: He is alert.     Motor: No weakness.     Gait: Gait normal.  Psychiatric:        Mood and Affect: Mood normal.        Behavior: Behavior normal. Behavior is cooperative.   Review of Systems  Constitutional:  Negative for chills and fever.  Cardiovascular:  Negative for chest pain and palpitations.  Neurological:  Positive for tremors. Negative for dizziness, tingling and headaches.  Psychiatric/Behavioral:  Positive for memory loss and substance abuse. Negative for depression, hallucinations and suicidal ideas. The patient is not nervous/anxious and does not have insomnia.   All other systems reviewed and are negative.  Mental Status Per Nursing Assessment::   On Admission:  NA  Demographic Factors:  Male, Low socioeconomic status, and Unemployed  Loss Factors: NA  Historical Factors: NA  Risk Reduction Factors:  NA  Continued  Clinical Symptoms:  N/A  Cognitive Features That Contribute To Risk:  Loss of executive function and Thought constriction (tunnel vision)    Suicide Risk:  Minimal: Patients may be classified as minimal risk based on the severity of the depressive symptoms.   Plan Of Care/Follow-up recommendations:  Discharge Instructions   None      Follow-up Information     [facility]. Go on 03/12/2021.   Why: You have an appointment to establish care with this provider for primary care services on 03/12/21 at 8:30 am.   This appointment will be held in person. *It is important that you keep and attend this appointment. Contact information: [place] [phone] [place] Follow up.   Why: [facility] is to follow up with you at your group home at [facility]. Contact information: [address] [place] [address] [phone]                 Activity as tolerated Diet as recommended by PCP Follow-up with your outpatient psychiatric provider -instructions on appointment date, time, and address (location) are provided to you in discharge paperwork. Take your psychiatric medications as prescribed at discharge - instructions are provided to you in the discharge paperwork Follow-up with outpatient primary care doctor and other specialists -for management of chronic medical disease, including: SEE ABOVE (IF ANY) Testing: Follow-up with outpatient provider for abnormal lab results: SEE ABOVE (IF ANY) Recommend abstinence from alcohol, tobacco, and other illicit drug use at discharge.  If your psychiatric symptoms recur, worsen, or if you have side effects to your psychiatric medications, call your outpatient psychiatric provider, 911, [phone] or go to the nearest emergency department. If suicidal thoughts recur, call your outpatient psychiatric provider, 911, [phone] or go to the nearest emergency  department.    Comments: # Prescriptions provided or sent directly to preferred pharmacy at discharge. Patient agreeable to plan. Given opportunity to ask questions. Appears to feel comfortable with discharge denies any current suicidal or homicidal thought.   # Patient was also instructed prior to discharge to: Take all medications as prescribed by mental healthcare provider. Report any adverse effects and or reactions from the medicines to outpatient provider promptly. Patient has been instructed & cautioned: To not engage in alcohol and or illegal drug use while on prescription medicines. In the event of worsening symptoms, patient is instructed to call the crisis hotline, 911 and or go to the nearest ED for appropriate evaluation and treatment of symptoms. To follow-up with primary care provider for other medical issues, concerns and or health care needs.   # The patient was evaluated each day by a clinical provider to ascertain response to treatment. Improvement was noted by the patient's report of decreasing symptoms, improved sleep and appetite, affect, medication tolerance, behavior, and participation in unit programming.  Patient was asked each day to complete a self inventory noting mood, mental status, pain, new symptoms, anxiety and concerns.   # Patient responded well to medication and being in a therapeutic and supportive environment. Positive and appropriate behavior was noted and the patient was motivated for recovery. The patient worked closely with the treatment team and case manager to develop a discharge plan with appropriate goals. Coping skills, problem solving as well as relaxation therapies were also part of the unit programming.   # By the day of discharge patient was in much  improved condition than upon admission. Symptoms were reported as significantly decreased or resolved completely. The patient denied having suicidal thoughts, intent or plan, which were explored carefully.  The patient denied having homicidal thoughts. The patient denies having auditory hallucinations, visual hallucinations, paranoia, or other symptoms of psychosis. The patient reports that his mood was stable. The patient was motivated to continue taking medication with a goal of continued improvement in mental health.    Patient was discharged to SEE ABOVE with a plan to follow up as noted ABOVE.   Signed: Merrily Brittle, DO Psychiatry Resident, PGY-1 [facility] 01/25/2021, 12:59 PM     Total Time Spent in Direct Patient Care:  I personally spent 45 minutes on the unit in direct patient care. The direct patient care time included face-to-face time with the patient, reviewing the patient's chart, communicating with other professionals, and coordinating care. Greater than 50% of this time was spent in counseling or coordinating care with the patient regarding goals of hospitalization, psycho-education, and discharge planning needs.  On my assessment the patient denied SI, HI, AVH, paranoia, ideas of reference, or first rank symptoms on day of discharge. Patient denied drug cravings or active signs of withdrawal. Patient denied medication side-effects. Patient was not deemed to be a danger to self or others on day of discharge and was in agreement with discharge plans.   I have independently evaluated the patient during a face-to-face assessment on the day of discharge. I reviewed the patient's chart, and I participated in key portions of the service. I discussed the case with the resident physician, and I agree with the assessment and plan of care as documented in the resident physician's note, as addended by me or notated below:  I directly edited the note, as above.   Janine Limbo, MD Psychiatrist

## 2021-01-25 NOTE — Group Note (Signed)
Recreation Therapy Group Note   Group Topic:Other  Group Date: 01/25/2021 Start Time: 1010 End Time: 1040 Facilitators: Victorino Sparrow, LRT,CTRS Location: [address] Day[address]   Goal Area(s) Addresses:  Patient will appropriately identify why self expression is important. Patient will create a shield of armor describing what makes them who they are. Patient will successfully identify positive attributes about themselves.    Group Description: Self-Expression Shield. Patient attended a recreation therapy group session focused on self expression.  LRT gave each patient a blank drawing of a shield.  Patients was asked to design the shield in a way to show off what is important to them.  The  four quadrants reflected the following:   The Upper Left quadrant- two things or people they value. The Upper Right quadrant- two lessons learned thus far in life. The Lower Left quadrant- three characteristics that make you unique. The Lower Right quadrant- what is a goal you are working towards.   Patients were provided sheets with the shield printed on them and colored pencils, markers and crayons to complete the activity.  Patients and writer had group related discussions while individually working on their activity.  Patients were debriefed on the importance of healthy self expression     Affect/Mood: Appropriate   Participation Level: Active   Participation Quality: Moderate Cues   Behavior: Appropriate   Speech/Thought Process: Irrational and Rational   Insight: Poor   Judgement: Poor   Modes of Intervention: Art   Patient Response to Interventions:  Attentive   Education Outcome:  Acknowledges education and In group clarification offered    Clinical Observations/Individualized Feedback: Pt came in late to group.  Some of pt answers went along with what the group was about, while others made no sense at all.  Pt described admiring a niece and one of his friends because "they make  it possible to be straight up".  Pt stated tow lessons he has learned were to "never get high on your own supply and slow your roll on drinking".  Pt explained the things that make him unique are wearing sneakers all the time and his like of t-shirts.  Lastly, pt stated he wants to learn to swim because it feels like he's watching people around him but doesn't understand them.    Plan: Continue to engage patient in RT group sessions 2-3x/week.   Victorino Sparrow, Glennis Brink 01/25/2021 12:39 PM

## 2021-01-25 NOTE — Progress Notes (Signed)
Recreation Therapy Notes  INPATIENT RECREATION TR PLAN  Patient Details Name: Tracie Lindbloom MRN: [id number] DOB: 1970/03/03 Today's Date: 01/25/2021  Rec Therapy Plan Is patient appropriate for Therapeutic Recreation?: Yes Treatment times per week: about 3 days Estimated Length of Stay: 5-7 days TR Treatment/Interventions: Group participation (Comment)  Discharge Criteria Pt will be discharged from therapy if:: Discharged Treatment plan/goals/alternatives discussed and agreed upon by:: Patient/family  Discharge Summary Short term goals set: See patient care plan Short term goals met: Adequate for discharge Progress toward goals comments: Groups attended Which groups?: Coping skills, Goal setting, Wellness, Other (Comment) (Self expression; Decision Making) Reason goals not met: Pt needs more time to fully understand coping skills Therapeutic equipment acquired: N/A Reason patient discharged from therapy: Discharge from hospital Pt/family agrees with progress & goals achieved: Yes Date patient discharged from therapy: 01/25/21     Victorino Sparrow, Vickki Muff, Janard Culp A 01/25/2021, 2:00 PM

## 2021-01-25 NOTE — Plan of Care (Signed)
Patient was able to make some strides in identifying coping strategies at completion of recreation therapy session.   Caroll Rancher, LRT,CTRS

## 2021-01-25 NOTE — Progress Notes (Signed)
°  [facility] Adult Case Management Discharge Plan :  Will you be returning to the same living situation after discharge:  Yes,  will be returning to group home At discharge, do you have transportation home?: Yes,  group home to pick this patient up Do you have the ability to pay for your medications: No. Samples to be provided at discharge  Release of information consent forms completed and in the chart;  Patient's signature needed at discharge.  Patient to Follow up at:  Follow-up Information     [facility] on 03/12/2021.   Why: You have an appointment to establish care with this provider for primary care services on 03/12/21 at 8:30 am.   This appointment will be held in person. *It is important that you keep and attend this appointment. Contact information: [facility] [phone] [place] Follow up.   Why: [facility] is to follow up with you at your group home at [facility]. Contact information: [place] [address] [phone]                 Next level of care provider has access to [place] and Suicide Prevention discussed: Yes,  with patient     Has patient been referred to the Quitline?: N/A patient is not a smoker  Patient has been referred for addiction treatment: [place], LCSW 01/25/2021, 11:36 AM

## 2021-01-25 NOTE — Group Note (Signed)
LCSW Group Therapy Note  Group Date: 01/25/2021 Start Time: 1100 End Time: 1200   Type of Therapy and Topic:  Group Therapy: Positive Affirmations  Participation Level:  Did Not Attend   Description of Group:   This group addressed positive affirmation towards self and others.  Patients went around the room and identified two positive things about themselves and two positive things about a peer in the room.  Patients reflected on how it felt to share something positive with others, to identify positive things about themselves, and to hear positive things from others/ Patients were encouraged to have a daily reflection of positive characteristics or circumstances.   Therapeutic Goals: Patients will verbalize two of their positive qualities Patients will demonstrate empathy for others by stating two positive qualities about a peer in the group Patients will verbalize their feelings when voicing positive self affirmations and when voicing positive affirmations of others Patients will discuss the potential positive impact on their wellness/recovery of focusing on positive traits of self and others.  Summary of Patient Progress:  Did not attend  Therapeutic Modalities:   Cognitive Ali Molina, LCSW 01/25/2021  11:28 AM

## 2021-01-26 NOTE — Plan of Care (Signed)
Was contacted that patient's prescriptions had been denied due to an issue with Dr. Weyman Rodney Parker Medicaid clearance.  Called back patient's pharmacy [facility] on [address] and provided them with my name and NPI. The Pharmacist reported that the prescriptions had been taken back but that when they are brought back in they will be run under my credentials.   Called Waynetta Sandy [phone] who had been the one to originally call the unit.  He confirmed identity with providing patient's name and date of birth.  Discussed that I had called the pharmacy and provided my information so that if he returned with the prescriptions they would be filled.  He reported understanding and had no other concerns.    01/26/2021  11:00 AM Arna Snipe MD Resident

## 2021-02-17 ENCOUNTER — Emergency Department (HOSPITAL_COMMUNITY): Payer: Medicaid Other

## 2021-02-17 ENCOUNTER — Other Ambulatory Visit: Payer: Self-pay

## 2021-02-17 ENCOUNTER — Emergency Department (HOSPITAL_COMMUNITY)
Admission: EM | Admit: 2021-02-17 | Discharge: 2021-02-17 | Disposition: A | Discharge status: Home / Self Care | Payer: Medicaid Other | Attending: Emergency Medicine | Admitting: Emergency Medicine

## 2021-02-17 ENCOUNTER — Encounter (HOSPITAL_COMMUNITY): Payer: Self-pay | Admitting: Emergency Medicine

## 2021-02-17 DIAGNOSIS — R569 Unspecified convulsions: Secondary | ICD-10-CM | POA: Diagnosis present

## 2021-02-17 DIAGNOSIS — Z79899 Other long term (current) drug therapy: Secondary | ICD-10-CM | POA: Insufficient documentation

## 2021-02-17 LAB — BASIC METABOLIC PANEL
Anion gap: 8 (ref 5–15)
BUN: 8 mg/dL (ref 6–20)
CO2: 27 mmol/L (ref 22–32)
Calcium: 9.2 mg/dL (ref 8.9–10.3)
Chloride: 96 mmol/L — ABNORMAL LOW (ref 98–111)
Creatinine, Ser: 0.77 mg/dL (ref 0.61–1.24)
GFR, Estimated: >60 mL/min (ref >60)
Glucose, Bld: 78 mg/dL (ref 70–99)
Potassium: 3.6 mmol/L (ref 3.5–5.1)
Sodium: 131 mmol/L — ABNORMAL LOW (ref 135–145)

## 2021-02-17 LAB — CBC WITH DIFFERENTIAL/PLATELET
Abs Immature Granulocytes: 0.01 10*3/uL (ref 0.00–0.07)
Basophils Absolute: 0 10*3/uL (ref 0.0–0.1)
Basophils Relative: 0 %
Eosinophils Absolute: 0.1 10*3/uL (ref 0.0–0.5)
Eosinophils Relative: 1 %
HCT: 39.7 % (ref 39.0–52.0)
Hemoglobin: 13.4 g/dL (ref 13.0–17.0)
Immature Granulocytes: 0 %
Lymphocytes Relative: 42 %
Lymphs Abs: 2.1 10*3/uL (ref 0.7–4.0)
MCH: 30.4 pg (ref 26.0–34.0)
MCHC: 33.8 g/dL (ref 30.0–36.0)
MCV: 90 fL (ref 80.0–100.0)
Monocytes Absolute: 0.3 10*3/uL (ref 0.1–1.0)
Monocytes Relative: 6 %
Neutro Abs: 2.5 10*3/uL (ref 1.7–7.7)
Neutrophils Relative %: 51 %
Platelets: 206 10*3/uL (ref 150–400)
RBC: 4.41 MIL/uL (ref 4.22–5.81)
RDW: 13.4 % (ref 11.5–15.5)
WBC: 5 10*3/uL (ref 4.0–10.5)
nRBC: 0 % (ref 0.0–0.2)

## 2021-02-17 MED ORDER — LORAZEPAM 2 MG/ML IJ SOLN
1.0000 mg | Freq: Once | INTRAMUSCULAR | Status: AC
  Administered 2021-02-17: 1 mg via INTRAVENOUS
  Filled 2021-02-17: qty 1

## 2021-02-17 NOTE — ED Notes (Signed)
Patient is resting comfortably. 

## 2021-02-17 NOTE — ED Notes (Signed)
Josh PA aware patient found on floor in triage. Patient taken to treatment room.

## 2021-02-17 NOTE — ED Triage Notes (Signed)
Per EMS, patient from group home, found post ictal by staff. Hx of seizures. Compliant with medications. Hx cognitive delay and schizophrenia. C-collar on by EMS. Baseline now per staff. Caregiver to provide transport home.

## 2021-02-17 NOTE — ED Notes (Addendum)
RN attempted to call pt's group home x 2. Registration had spoken to them earlier and confirmed contact info.  [phone] and [phone]

## 2021-02-17 NOTE — Discharge Instructions (Addendum)
SEIZURE PRECAUTIONS °Per [place] DMV statutes, patients with seizures are not allowed to drive until they have been seizure-free for six months. °  °Use caution when using heavy equipment or power tools. Avoid working on ladders or at heights. Take showers instead of baths. Ensure the water temperature is not too high on the home water heater. Do not go swimming alone. Do not lock yourself in a room alone (i.e. bathroom). When caring for infants or small children, sit down when holding, feeding, or changing them to minimize risk of injury to the child in the event you have a seizure. Maintain good sleep hygiene. Avoid alcohol. °  °If patient has another seizure, call 911 and bring them back to the ED if: °A.  The seizure lasts longer than 5 minutes.      °B.  The patient doesn't wake shortly after the seizure or has new problems such as difficulty seeing, speaking or moving following the seizure °C.  The patient was injured during the seizure °D.  The patient has a temperature over 102 F (39C) °E.  The patient vomited during the seizure and now is having trouble breathing ° ° °

## 2021-02-17 NOTE — ED Notes (Signed)
Rn got in touch with staff and he will arrange someone to come get him. RN informed he is up for discharge.

## 2021-02-17 NOTE — ED Provider Notes (Signed)
[facility] DEPT Provider Note   CSN: [id number] Arrival date & time: 02/17/21  1744     History  Chief Complaint  Patient presents with   Seizures    Daniel Foley is a 51 y.o. male.  Patient presented with a breakthrough seizure-like episode.  He has a history of schizophrenia seizure deficits.  States has been taking his medicine regularly.  Bystanders noticed a tonic-clonic seizure-like episode and he was brought to the ER.  C-collar applied by EMS given that there was a reported fall.  Patient himself at this time states he has no pain.  Denies any headache or chest pain or abdominal pain.  No reports of fevers or cough.      Home Medications Prior to Admission medications   Medication Sig Start Date End Date Taking? Authorizing Provider  albuterol (VENTOLIN HFA) 108 (90 Base) MCG/ACT inhaler Inhale 2 puffs into the lungs every 4 (four) hours as needed for shortness of breath.    [provider]  amantadine (SYMMETREL) 100 MG capsule Take 1 capsule (100 mg total) by mouth every other day. 01/27/21 02/26/21  Merrily Brittle, DO  ARIPiprazole (ABILIFY) 5 MG tablet Take 1 tablet (5 mg total) by mouth at bedtime. 01/25/21 02/24/21  Merrily Brittle, DO  budesonide-formoterol (SYMBICORT) 160-4.5 MCG/ACT inhaler Inhale 2 puffs into the lungs 2 (two) times daily.    [provider]  dapagliflozin propanediol (FARXIGA) 10 MG TABS tablet Take 1 tablet (10 mg total) by mouth daily. 01/25/21 02/24/21  Merrily Brittle, DO  divalproex (DEPAKOTE) 250 MG DR tablet Take 3 tablets (750 mg total) by mouth 2 (two) times daily. 01/25/21 02/24/21  Merrily Brittle, DO  docusate sodium (COLACE) 100 MG capsule Take 1 capsule (100 mg total) by mouth 2 (two) times daily. 01/25/21 02/24/21  Merrily Brittle, DO  donepezil (ARICEPT) 5 MG tablet Take 1 tablet (5 mg total) by mouth at bedtime. 01/25/21 02/24/21  Merrily Brittle, DO  Lacosamide 150 MG TABS Take 1 tablet (150 mg total) by  mouth 2 (two) times daily. 01/25/21 02/24/21  Merrily Brittle, DO  levETIRAcetam (KEPPRA) 500 MG tablet Take 1 tablet (500 mg total) by mouth 2 (two) times daily. 01/25/21 02/24/21  Merrily Brittle, DO  melatonin 3 MG TABS tablet Take 1 tablet (3 mg total) by mouth at bedtime. 01/25/21   Merrily Brittle, DO  nicotine (NICODERM CQ - DOSED IN MG/24 HOURS) 14 mg/24hr patch Place 1 patch (14 mg total) onto the skin daily. 01/25/21 02/24/21  Merrily Brittle, DO  OLANZapine zydis (ZYPREXA) 15 MG disintegrating tablet Take 1 tablet (15 mg total) by mouth at bedtime. 01/25/21 02/24/21  Merrily Brittle, DO  OLANZapine zydis (ZYPREXA) 5 MG disintegrating tablet Take 1 tablet (5 mg total) by mouth daily. 01/26/21 02/25/21  Merrily Brittle, DO  rosuvastatin (CRESTOR) 20 MG tablet Take 1 tablet (20 mg total) by mouth daily. 01/25/21 02/24/21  Merrily Brittle, DO  Tiotropium Bromide Monohydrate (SPIRIVA RESPIMAT) 1.25 MCG/ACT AERS Inhale 2 puffs into the lungs daily.    [provider]      Allergies    Contrast media [iodinated contrast media]    Review of Systems   Review of Systems  Constitutional:  Negative for fever.  HENT:  Negative for ear pain and sore throat.   Eyes:  Negative for pain.  Respiratory:  Negative for cough.   Cardiovascular:  Negative for chest pain.  Gastrointestinal:  Negative for abdominal pain.  Genitourinary:  Negative for flank  pain.  Musculoskeletal:  Negative for back pain.  Skin:  Negative for color change and rash.  Neurological:  Negative for syncope.  All other systems reviewed and are negative.  Physical Exam Updated Vital Signs BP 113/81    Pulse 64    Temp 98.6 F (37 C) (Oral)    Resp 14    SpO2 96%  Physical Exam Constitutional:      Appearance: He is well-developed.  HENT:     Head: Normocephalic.     Nose: Nose normal.  Eyes:     Extraocular Movements: Extraocular movements intact.  Cardiovascular:     Rate and Rhythm: Normal rate.  Pulmonary:     Effort: Pulmonary  effort is normal.  Skin:    Coloration: Skin is not jaundiced.  Neurological:     General: No focal deficit present.     Mental Status: He is alert and oriented to person, place, and time. Mental status is at baseline.     Cranial Nerves: No cranial nerve deficit.     Motor: No weakness.     Gait: Gait normal.    ED Results / Procedures / Treatments   Labs (all labs ordered are listed, but only abnormal results are displayed) Labs Reviewed  BASIC METABOLIC PANEL - Abnormal; Notable for the following components:      Result Value   Sodium 131 (*)    Chloride 96 (*)    All other components within normal limits  CBC WITH DIFFERENTIAL/PLATELET    EKG None  Radiology CT HEAD WO CONTRAST (5MM)  Result Date: 02/17/2021 CLINICAL DATA:  Seizure disorder, clinical change seizure today, unwitnessed; Neck trauma, midline tenderness (Age 24-64y) seizure today, unwitnessed, c-spine clearance EXAM: CT HEAD WITHOUT CONTRAST CT CERVICAL SPINE WITHOUT CONTRAST TECHNIQUE: Multidetector CT imaging of the head and cervical spine was performed following the standard protocol without intravenous contrast. Multiplanar CT image reconstructions of the cervical spine were also generated. RADIATION DOSE REDUCTION: This exam was performed according to the departmental dose-optimization program which includes automated exposure control, adjustment of the mA and/or kV according to patient size and/or use of iterative reconstruction technique. COMPARISON:  06/21/2020 FINDINGS: CT HEAD FINDINGS Brain: No evidence of acute infarction, hemorrhage, hydrocephalus, extra-axial collection or mass lesion/mass effect. Vascular: No hyperdense vessel or unexpected calcification. Skull: Normal. Negative for fracture or focal lesion. Sinuses/Orbits: No acute finding. Small retention cyst within the right maxillary sinus. Other: Odontogenic maxillary periapical lucencies are noted. CT CERVICAL SPINE FINDINGS Alignment: Facet joints  are aligned without dislocation or traumatic listhesis. Dens and lateral masses are aligned. Trace retrolisthesis of C4 on C5. Skull base and vertebrae: No acute fracture. No primary bone lesion or focal pathologic process. Soft tissues and spinal canal: No prevertebral fluid or swelling. No visible canal hematoma. Disc levels: Advanced degenerative disc disease of the C4-5 level with discogenic endplate sclerosis. Relatively minimal facet arthropathy. Upper chest: Negative. Other: None. IMPRESSION: 1. No acute intracranial abnormality. 2. No acute cervical spine fracture or subluxation. 3. Advanced degenerative disc disease of the C4-5 level. Electronically Signed   By: Davina Poke D.O.   On: 02/17/2021 18:43   CT Cervical Spine Wo Contrast  Result Date: 02/17/2021 CLINICAL DATA:  Seizure disorder, clinical change seizure today, unwitnessed; Neck trauma, midline tenderness (Age 59-64y) seizure today, unwitnessed, c-spine clearance EXAM: CT HEAD WITHOUT CONTRAST CT CERVICAL SPINE WITHOUT CONTRAST TECHNIQUE: Multidetector CT imaging of the head and cervical spine was performed following the standard protocol without intravenous  contrast. Multiplanar CT image reconstructions of the cervical spine were also generated. RADIATION DOSE REDUCTION: This exam was performed according to the departmental dose-optimization program which includes automated exposure control, adjustment of the mA and/or kV according to patient size and/or use of iterative reconstruction technique. COMPARISON:  06/21/2020 FINDINGS: CT HEAD FINDINGS Brain: No evidence of acute infarction, hemorrhage, hydrocephalus, extra-axial collection or mass lesion/mass effect. Vascular: No hyperdense vessel or unexpected calcification. Skull: Normal. Negative for fracture or focal lesion. Sinuses/Orbits: No acute finding. Small retention cyst within the right maxillary sinus. Other: Odontogenic maxillary periapical lucencies are noted. CT CERVICAL  SPINE FINDINGS Alignment: Facet joints are aligned without dislocation or traumatic listhesis. Dens and lateral masses are aligned. Trace retrolisthesis of C4 on C5. Skull base and vertebrae: No acute fracture. No primary bone lesion or focal pathologic process. Soft tissues and spinal canal: No prevertebral fluid or swelling. No visible canal hematoma. Disc levels: Advanced degenerative disc disease of the C4-5 level with discogenic endplate sclerosis. Relatively minimal facet arthropathy. Upper chest: Negative. Other: None. IMPRESSION: 1. No acute intracranial abnormality. 2. No acute cervical spine fracture or subluxation. 3. Advanced degenerative disc disease of the C4-5 level. Electronically Signed   By: Davina Poke D.O.   On: 02/17/2021 18:43    Procedures Procedures    Medications Ordered in ED Medications  LORazepam (ATIVAN) injection 1 mg (1 mg Intravenous Given 02/17/21 1951)    ED Course/ Medical Decision Making/ A&P                           Medical Decision Making Amount and/or Complexity of Data Reviewed Labs: ordered. Radiology: ordered.  Risk Prescription drug management.   Review of records shows no recent outpatient visits.  Work-up shows normal chemistry normal CBC.  CT of the C-spine and head showed no acute pathology.  Patient continued be observed in the ER for several hours.  He had 1 episode of right hand twitching while he was awake and alert and he was given 1 mg of Ativan.  Subsequently no additional adverse events were noted.  I feel he stable for continued outpatient management.  Advised to follow-up with his doctor within the next 2 to 4 days and to call his neurologist in 3 to 4 days.  Advising immediate return for recurrent seizures, fevers pain or any additional concerns.        Final Clinical Impression(s) / ED Diagnoses Final diagnoses:  Seizure [facility])    Rx / [facility] Orders ED Discharge Orders     None         Luna Fuse,  MD 02/17/21 2154

## 2021-02-17 NOTE — ED Provider Triage Note (Addendum)
Emergency Medicine Provider Triage Evaluation Note  Daniel Foley , a 51 y.o. male  was evaluated in triage.  Pt complains of seizure.  He has a history of schizophrenia, seizure disorder.  Hyponatremia noted on 01/08/21. Med list currently states that he is on Depakote, lacosamide, levetiracetam.  Patient does not remember the incident.  C-collar applied by EMS.  Review of Systems  Positive: Seizure Negative: Headache  Physical Exam  BP 97/73    Pulse 74    Temp 98.6 F (37 C) (Oral)    Resp 18    SpO2 95%  Gen:   Awake, no distress   Resp:  Normal effort  MSK:   Moves extremities without difficulty  Other:  No significant tongue trauma  Medical Decision Making  Medically screening exam initiated at 6:11 PM.  Appropriate orders placed.  Adin Laker was informed that the remainder of the evaluation will be completed by another provider, this initial triage assessment does not replace that evaluation, and the importance of remaining in the ED until their evaluation is complete.   7:07 PM I was notified by RN that patient was found on floor in room next to overturned wheelchair.  Reportedly looking around but not talking. I went to see patient but had already been moved to the back.      Renne Crigler, PA-C 02/17/21 1817    Renne Crigler, PA-C 02/17/21 1909

## 2021-02-17 NOTE — ED Notes (Signed)
RN walked into room and found pt on the floor. Pt was assisted to a stretcher. Provider notified.

## 2021-03-12 ENCOUNTER — Ambulatory Visit: Payer: Self-pay | Admitting: Family Medicine

## 2021-03-16 ENCOUNTER — Other Ambulatory Visit: Payer: Self-pay

## 2021-03-16 ENCOUNTER — Emergency Department (HOSPITAL_COMMUNITY)
Admission: EM | Admit: 2021-03-16 | Discharge: 2021-03-16 | Disposition: A | Discharge status: Home / Self Care | Payer: Medicaid Other | Attending: Emergency Medicine | Admitting: Emergency Medicine

## 2021-03-16 ENCOUNTER — Encounter (HOSPITAL_COMMUNITY): Payer: Self-pay

## 2021-03-16 DIAGNOSIS — F99 Mental disorder, not otherwise specified: Secondary | ICD-10-CM | POA: Insufficient documentation

## 2021-03-16 DIAGNOSIS — F0390 Unspecified dementia without behavioral disturbance: Secondary | ICD-10-CM | POA: Insufficient documentation

## 2021-03-16 DIAGNOSIS — Z79899 Other long term (current) drug therapy: Secondary | ICD-10-CM | POA: Insufficient documentation

## 2021-03-16 DIAGNOSIS — E162 Hypoglycemia, unspecified: Secondary | ICD-10-CM | POA: Insufficient documentation

## 2021-03-16 DIAGNOSIS — E871 Hypo-osmolality and hyponatremia: Secondary | ICD-10-CM | POA: Insufficient documentation

## 2021-03-16 DIAGNOSIS — G40909 Epilepsy, unspecified, not intractable, without status epilepticus: Secondary | ICD-10-CM | POA: Insufficient documentation

## 2021-03-16 DIAGNOSIS — R569 Unspecified convulsions: Secondary | ICD-10-CM

## 2021-03-16 LAB — CBC WITH DIFFERENTIAL/PLATELET
Abs Immature Granulocytes: 0.02 10*3/uL (ref 0.00–0.07)
Basophils Absolute: 0 10*3/uL (ref 0.0–0.1)
Basophils Relative: 0 %
Eosinophils Absolute: 0 10*3/uL (ref 0.0–0.5)
Eosinophils Relative: 1 %
HCT: 45.4 % (ref 39.0–52.0)
Hemoglobin: 15 g/dL (ref 13.0–17.0)
Immature Granulocytes: 0 %
Lymphocytes Relative: 45 %
Lymphs Abs: 3.3 10*3/uL (ref 0.7–4.0)
MCH: 30.4 pg (ref 26.0–34.0)
MCHC: 33 g/dL (ref 30.0–36.0)
MCV: 92.1 fL (ref 80.0–100.0)
Monocytes Absolute: 0.4 10*3/uL (ref 0.1–1.0)
Monocytes Relative: 5 %
Neutro Abs: 3.6 10*3/uL (ref 1.7–7.7)
Neutrophils Relative %: 49 %
Platelets: 255 10*3/uL (ref 150–400)
RBC: 4.93 MIL/uL (ref 4.22–5.81)
RDW: 13.2 % (ref 11.5–15.5)
WBC: 7.4 10*3/uL (ref 4.0–10.5)
nRBC: 0 % (ref 0.0–0.2)

## 2021-03-16 LAB — COMPREHENSIVE METABOLIC PANEL
ALT: 14 U/L (ref 0–44)
AST: 20 U/L (ref 15–41)
Albumin: 4.3 g/dL (ref 3.5–5.0)
Alkaline Phosphatase: 49 U/L (ref 38–126)
Anion gap: 7 (ref 5–15)
BUN: 12 mg/dL (ref 6–20)
CO2: 29 mmol/L (ref 22–32)
Calcium: 9.8 mg/dL (ref 8.9–10.3)
Chloride: 98 mmol/L (ref 98–111)
Creatinine, Ser: 1.1 mg/dL (ref 0.61–1.24)
GFR, Estimated: >60 mL/min (ref >60)
Glucose, Bld: 65 mg/dL — ABNORMAL LOW (ref 70–99)
Potassium: 4 mmol/L (ref 3.5–5.1)
Sodium: 134 mmol/L — ABNORMAL LOW (ref 135–145)
Total Bilirubin: 0.2 mg/dL — ABNORMAL LOW (ref 0.3–1.2)
Total Protein: 8.8 g/dL — ABNORMAL HIGH (ref 6.5–8.1)

## 2021-03-16 LAB — ETHANOL: Alcohol, Ethyl (B): <10 mg/dL (ref <10)

## 2021-03-16 LAB — CBG MONITORING, ED: Glucose-Capillary: 88 mg/dL (ref 70–99)

## 2021-03-16 NOTE — ED Triage Notes (Signed)
Patient brought in via ems from home. Patient possibly had an unwitnessed seizure this morning. Patient back at baseline now. States he takes medications compliantly  ?

## 2021-03-16 NOTE — ED Provider Notes (Signed)
?[facility]-EMERGENCY DEPT ?Provider Note ? ? ?CSN: [id number] ?Arrival date & time: 03/16/21  1223 ? ?  ? ?History ? ?Chief Complaint  ?Patient presents with  ? Seizures  ? ? ?Daniel Foley is a 51 y.o. male. ? ?HPI ?Patient with seizure disorder presents after 2 reported witnessed seizures.  The patient has no complaints, is awake, alert, speaking clearly, denies pain, lightheadedness, discomfort anywhere.  He states that he has been taking his medication as directed, and today took 2 tablets that assist with" having sex", otherwise no medication change, diet change, activity change.  Patient has noted history of dementia, schizophrenia, lives in a residential facility, seemingly. ?  ? ?Home Medications ?Prior to Admission medications   ?Medication Sig Start Date End Date Taking? Authorizing Provider  ?albuterol (VENTOLIN HFA) 108 (90 Base) MCG/ACT inhaler Inhale 2 puffs into the lungs every 4 (four) hours as needed for shortness of breath.    [provider]  ?amantadine (SYMMETREL) 100 MG capsule Take 1 capsule (100 mg total) by mouth every other day. 01/27/21 02/26/21  Princess Bruins, DO  ?ARIPiprazole (ABILIFY) 5 MG tablet Take 1 tablet (5 mg total) by mouth at bedtime. 01/25/21 02/24/21  Princess Bruins, DO  ?budesonide-formoterol (SYMBICORT) 160-4.5 MCG/ACT inhaler Inhale 2 puffs into the lungs 2 (two) times daily.    [provider]  ?dapagliflozin propanediol (FARXIGA) 10 MG TABS tablet Take 1 tablet (10 mg total) by mouth daily. 01/25/21 02/24/21  Princess Bruins, DO  ?divalproex (DEPAKOTE) 250 MG DR tablet Take 3 tablets (750 mg total) by mouth 2 (two) times daily. 01/25/21 02/24/21  Princess Bruins, DO  ?donepezil (ARICEPT) 5 MG tablet Take 1 tablet (5 mg total) by mouth at bedtime. 01/25/21 02/24/21  Princess Bruins, DO  ?Lacosamide 150 MG TABS Take 1 tablet (150 mg total) by mouth 2 (two) times daily. 01/25/21 02/24/21  Princess Bruins, DO  ?levETIRAcetam (KEPPRA) 500 MG tablet  Take 1 tablet (500 mg total) by mouth 2 (two) times daily. 01/25/21 02/24/21  Princess Bruins, DO  ?melatonin 3 MG TABS tablet Take 1 tablet (3 mg total) by mouth at bedtime. 01/25/21   Princess Bruins, DO  ?OLANZapine zydis (ZYPREXA) 15 MG disintegrating tablet Take 1 tablet (15 mg total) by mouth at bedtime. 01/25/21 02/24/21  Princess Bruins, DO  ?OLANZapine zydis (ZYPREXA) 5 MG disintegrating tablet Take 1 tablet (5 mg total) by mouth daily. 01/26/21 02/25/21  Princess Bruins, DO  ?rosuvastatin (CRESTOR) 20 MG tablet Take 1 tablet (20 mg total) by mouth daily. 01/25/21 02/24/21  Princess Bruins, DO  ?Tiotropium Bromide Monohydrate (SPIRIVA RESPIMAT) 1.25 MCG/ACT AERS Inhale 2 puffs into the lungs daily.    [provider]  ?   ? ?Allergies    ?Contrast media [iodinated contrast media]   ? ?Review of Systems   ?Review of Systems  ?Constitutional:   ?     Per HPI, otherwise negative  ?HENT:    ?     Per HPI, otherwise negative  ?Respiratory:    ?     Per HPI, otherwise negative  ?Cardiovascular:   ?     Per HPI, otherwise negative  ?Gastrointestinal:  Negative for vomiting.  ?Endocrine:  ?     Negative aside from HPI  ?Genitourinary:   ?     Neg aside from HPI   ?Musculoskeletal:   ?     Per HPI, otherwise negative  ?Skin: Negative.   ?Neurological:  Positive for seizures. Negative for  syncope.  ?Psychiatric/Behavioral:  Negative for suicidal ideas.   ? ?Physical Exam ?Updated Vital Signs ?BP 135/90   Pulse (!) 58   Temp 98.6 ?F (37 ?C) (Oral)   Resp 16   Ht 6\' 2"  (1.88 m)   Wt 90.7 kg   SpO2 96%   BMI 25.68 kg/m?  ?Physical Exam ?Vitals and nursing note reviewed.  ?Constitutional:   ?   General: He is not in acute distress. ?   Appearance: He is well-developed.  ?HENT:  ?   Head: Normocephalic and atraumatic.  ?Eyes:  ?   Conjunctiva/sclera: Conjunctivae normal.  ?Cardiovascular:  ?   Rate and Rhythm: Normal rate and regular rhythm.  ?Pulmonary:  ?   Effort: Pulmonary effort is normal. No respiratory distress.  ?    Breath sounds: No stridor.  ?Abdominal:  ?   General: There is no distension.  ?Skin: ?   General: Skin is warm and dry.  ?Neurological:  ?   Mental Status: He is alert and oriented to person, place, and time.  ?Psychiatric:     ?   Thought Content: Thought content does not include suicidal ideation.     ?   Cognition and Memory: Cognition is impaired.  ? ? ?ED Results / Procedures / Treatments   ?Labs ?(all labs ordered are listed, but only abnormal results are displayed) ?Labs Reviewed  ?COMPREHENSIVE METABOLIC PANEL - Abnormal; Notable for the following components:  ?    Result Value  ? Sodium 134 (*)   ? Glucose, Bld 65 (*)   ? Total Protein 8.8 (*)   ? Total Bilirubin 0.2 (*)   ? All other components within normal limits  ?CBC WITH DIFFERENTIAL/PLATELET  ?ETHANOL  ?CBG MONITORING, ED  ? ? ?EKG ?None ? ?Radiology ?No results found. ? ?Procedures ?Procedures  ? ? ?Medications Ordered in ED ?Medications - No data to display ? ?ED Course/ Medical Decision Making/ A&P ? This patient presents to the ED for concern of seizures, this involves an extensive number of treatment options, and is a complaint that carries with it a high risk of complications and morbidity.  The differential diagnosis includes breakthrough seizures in the context of known seizure disorder, possibly compliant with seizure medication, as well as infection, drug related given his new use of Viagra as well as post seizure trauma ? ? ?Co morbidities that complicate the patient evaluation ? ?Psychiatric disease, dementia, seizure ? ? ?Social Determinants of Health: ? ?Psychiatric disease, boarding home residency ? ? ?Additional history obtained: ? ?Additional history and/or information obtained from chart review ?External records from outside source obtained and reviewed including discharge about 1 month ago following admission for suicidal intent, no evidence for this today ? ? ?After the initial evaluation, orders, including: Labs were  initiated. ? ?Patient placed on Cardiac and Pulse-Oximetry Monitors. ?The patient was maintained on a cardiac monitor.  The cardiac monitored showed an rhythm of 60 sinus normal ?The patient was also maintained on pulse oximetry. The readings were typically 100% normal note room air ? ?On repeat evaluation of the patient improvedHe did have a brief period of minimal hypoglycemia that improved with food, juice ? ?Lab Tests: ? ?I personally interpreted labs.  The pertinent results include: Unremarkable labs aside from mild hyponatremia ? ? ? ?Dispostion / Final MDM: ? ?After consideration of the diagnostic results and the patient's response to treatment, he is appropriate for discharge, was encouraged to take his antiepileptics regularly, not take new medications  at all until he speaks with his physician.  Here no evidence for acute new other phenomena including infection, bacteremia, sepsis, substantial electrolyte abnormalities, and no evidence for post seizure or trauma. ? ?Final Clinical Impression(s) / ED Diagnoses ?Final diagnoses:  ?Seizure (HCC)  ? ? ?Rx / DC Orders ?ED Discharge Orders   ? ? None  ? ?  ? ? ?  ?Gerhard MunchLockwood, Bevin Mayall, MD ?03/16/21 1503 ? ?

## 2021-03-16 NOTE — ED Notes (Signed)
Ambulatory to restroom. ?Patient tolerating PO appropriately  ?

## 2021-03-16 NOTE — Discharge Instructions (Signed)
As discussed, your evaluation today has been largely reassuring.  But, it is important that you monitor your condition carefully, and do not hesitate to return to the ED if you develop new, or concerning changes in your condition. ? ?Otherwise, please follow-up with your physician for appropriate ongoing care.  It is very important that you discuss your medications to ensure that your regimen is adequately controlling your seizures. ? ?

## 2021-03-16 NOTE — ED Notes (Signed)
Stephen at group home states he will send a staff member to pick patient up ?

## 2021-03-28 ENCOUNTER — Emergency Department (HOSPITAL_COMMUNITY): Payer: No Typology Code available for payment source

## 2021-03-28 ENCOUNTER — Emergency Department (HOSPITAL_COMMUNITY)
Admission: EM | Admit: 2021-03-28 | Discharge: 2021-03-30 | Disposition: A | Discharge status: Home / Self Care | Payer: No Typology Code available for payment source | Attending: Emergency Medicine | Admitting: Emergency Medicine

## 2021-03-28 DIAGNOSIS — Z79899 Other long term (current) drug therapy: Secondary | ICD-10-CM | POA: Insufficient documentation

## 2021-03-28 DIAGNOSIS — R461 Bizarre personal appearance: Secondary | ICD-10-CM | POA: Diagnosis present

## 2021-03-28 DIAGNOSIS — Y900 Blood alcohol level of less than 20 mg/100 ml: Secondary | ICD-10-CM | POA: Diagnosis not present

## 2021-03-28 DIAGNOSIS — R509 Fever, unspecified: Secondary | ICD-10-CM

## 2021-03-28 DIAGNOSIS — F209 Schizophrenia, unspecified: Secondary | ICD-10-CM | POA: Diagnosis not present

## 2021-03-28 DIAGNOSIS — R519 Headache, unspecified: Secondary | ICD-10-CM | POA: Diagnosis not present

## 2021-03-28 DIAGNOSIS — Z20822 Contact with and (suspected) exposure to covid-19: Secondary | ICD-10-CM | POA: Insufficient documentation

## 2021-03-28 DIAGNOSIS — R4689 Other symptoms and signs involving appearance and behavior: Secondary | ICD-10-CM | POA: Clinically undetermined

## 2021-03-28 DIAGNOSIS — F919 Conduct disorder, unspecified: Secondary | ICD-10-CM

## 2021-03-28 DIAGNOSIS — Z046 Encounter for general psychiatric examination, requested by authority: Secondary | ICD-10-CM | POA: Diagnosis not present

## 2021-03-28 LAB — BASIC METABOLIC PANEL
Anion gap: 9 (ref 5–15)
BUN: 19 mg/dL (ref 6–20)
CO2: 26 mmol/L (ref 22–32)
Calcium: 9.6 mg/dL (ref 8.9–10.3)
Chloride: 103 mmol/L (ref 98–111)
Creatinine, Ser: 1.45 mg/dL — ABNORMAL HIGH (ref 0.61–1.24)
GFR, Estimated: 59 mL/min — ABNORMAL LOW (ref >60)
Glucose, Bld: 156 mg/dL — ABNORMAL HIGH (ref 70–99)
Potassium: 4.1 mmol/L (ref 3.5–5.1)
Sodium: 138 mmol/L (ref 135–145)

## 2021-03-28 LAB — URINALYSIS, ROUTINE W REFLEX MICROSCOPIC
Bilirubin Urine: NEGATIVE
Glucose, UA: >=500 mg/dL — AB
Hgb urine dipstick: NEGATIVE
Ketones, ur: NEGATIVE mg/dL
Leukocytes,Ua: NEGATIVE
Nitrite: NEGATIVE
Protein, ur: NEGATIVE mg/dL
Specific Gravity, Urine: 1.009 (ref 1.005–1.030)
pH: 5 (ref 5.0–8.0)

## 2021-03-28 LAB — CBC WITH DIFFERENTIAL/PLATELET
Abs Immature Granulocytes: 0.03 10*3/uL (ref 0.00–0.07)
Basophils Absolute: 0 10*3/uL (ref 0.0–0.1)
Basophils Relative: 0 %
Eosinophils Absolute: 0.1 10*3/uL (ref 0.0–0.5)
Eosinophils Relative: 1 %
HCT: 41.6 % (ref 39.0–52.0)
Hemoglobin: 13.9 g/dL (ref 13.0–17.0)
Immature Granulocytes: 0 %
Lymphocytes Relative: 55 %
Lymphs Abs: 4.2 10*3/uL — ABNORMAL HIGH (ref 0.7–4.0)
MCH: 30.5 pg (ref 26.0–34.0)
MCHC: 33.4 g/dL (ref 30.0–36.0)
MCV: 91.2 fL (ref 80.0–100.0)
Monocytes Absolute: 0.5 10*3/uL (ref 0.1–1.0)
Monocytes Relative: 6 %
Neutro Abs: 3 10*3/uL (ref 1.7–7.7)
Neutrophils Relative %: 38 %
Platelets: 250 10*3/uL (ref 150–400)
RBC: 4.56 MIL/uL (ref 4.22–5.81)
RDW: 13.2 % (ref 11.5–15.5)
WBC: 7.8 10*3/uL (ref 4.0–10.5)
nRBC: 0 % (ref 0.0–0.2)

## 2021-03-28 LAB — RESP PANEL BY RT-PCR (FLU A&B, COVID) ARPGX2
Influenza A by PCR: NEGATIVE
Influenza B by PCR: NEGATIVE
SARS Coronavirus 2 by RT PCR: NEGATIVE

## 2021-03-28 LAB — RAPID URINE DRUG SCREEN, HOSP PERFORMED
Amphetamines: NOT DETECTED
Barbiturates: NOT DETECTED
Benzodiazepines: NOT DETECTED
Cocaine: NOT DETECTED
Opiates: NOT DETECTED
Tetrahydrocannabinol: NOT DETECTED

## 2021-03-28 LAB — AMMONIA: Ammonia: 21 umol/L (ref 9–35)

## 2021-03-28 LAB — LACTIC ACID, PLASMA: Lactic Acid, Venous: 1.3 mmol/L (ref 0.5–1.9)

## 2021-03-28 LAB — ETHANOL: Alcohol, Ethyl (B): <10 mg/dL (ref <10)

## 2021-03-28 MED ORDER — ACETAMINOPHEN 325 MG PO TABS
650.0000 mg | ORAL_TABLET | Freq: Once | ORAL | Status: AC
  Administered 2021-03-28: 650 mg via ORAL
  Filled 2021-03-28: qty 2

## 2021-03-28 NOTE — ED Notes (Addendum)
Staffing called regarding the sitter order. Sitter being sent to pt. pt in burgundy scrubs and placed in a room that is within eye shot of the nurses station  ?

## 2021-03-28 NOTE — ED Provider Triage Note (Addendum)
Emergency Medicine Provider Triage Evaluation Note ? ?Carmelina Dane , a 51 y.o. male  was evaluated in triage.   Pt presents to the ED today with caregiver from group home for psychiatric evaluation.  Patient is agitated, not making much sense.  Caregiver reports that he is typically a little bit delirious with mild psychosis however worsened today.  He does mention that patient disappeared for a few minutes from the group home.  He found him on the side of the street and states that his mental status was completely different.  He was unaware that patient had a fever.  Denies any cough or cold-like symptoms.  He is unsure if patient could have gotten his hands on any drugs when he disappeared from group home earlier today.  Does report history of drug abuse in the past.  ?Review of Systems  ?Positive: + AMS ?Negative: Unable to assess ? ?Physical Exam  ?BP 134/83   Pulse (!) 120   Temp (!) 101.4 ?F (38.6 ?C)   Resp 17   SpO2 93%  ?Gen:   Awake, no distress   ?Resp:  Normal effort  ?MSK:   Moves extremities without difficulty  ?Other:  Able to follow commands. Slightly agitated however able to be directed.  ? ?Medical Decision Making  ?Medically screening exam initiated at 9:33 PM.  Appropriate orders placed.  Delio Slates was informed that the remainder of the evaluation will be completed by another provider, this initial triage assessment does not replace that evaluation, and the importance of remaining in the ED until their evaluation is complete. ? ?Pt IVC'd at this time.  ? ?  ?Tanda Rockers, PA-C ?03/28/21 2153 ? ?

## 2021-03-28 NOTE — ED Provider Notes (Signed)
?[facility] EMERGENCY DEPARTMENT ?Provider Note ? ? ?CSN: [id number] ?Arrival date & time: 03/28/21  2104 ? ?  ? ?History ? ?Chief Complaint  ?Patient presents with  ? Psychiatric Evaluation  ? ? ?Daniel Foley is a 51 y.o. male. ? ?Patient presents to the emergency department for odd behavior.  Patient normally resides in a group home.  Patient apparently wandered away from the group home and when he returned he was exhibiting bizarre behavior. ? ?Patient reports that he has a headache currently, no other complaints.  He does endorse hearing voices but he cannot determine what they are saying.  Denies homicidality, suicidality. ? ? ?  ? ?Home Medications ?Prior to Admission medications   ?Medication Sig Start Date End Date Taking? Authorizing Provider  ?albuterol (VENTOLIN HFA) 108 (90 Base) MCG/ACT inhaler Inhale 2 puffs into the lungs every 4 (four) hours as needed for shortness of breath.    [provider]  ?amantadine (SYMMETREL) 100 MG capsule Take 1 capsule (100 mg total) by mouth every other day. 01/27/21 02/26/21  Princess Bruins, DO  ?ARIPiprazole (ABILIFY) 5 MG tablet Take 1 tablet (5 mg total) by mouth at bedtime. 01/25/21 02/24/21  Princess Bruins, DO  ?budesonide-formoterol (SYMBICORT) 160-4.5 MCG/ACT inhaler Inhale 2 puffs into the lungs 2 (two) times daily.    [provider]  ?dapagliflozin propanediol (FARXIGA) 10 MG TABS tablet Take 1 tablet (10 mg total) by mouth daily. 01/25/21 02/24/21  Princess Bruins, DO  ?divalproex (DEPAKOTE) 250 MG DR tablet Take 3 tablets (750 mg total) by mouth 2 (two) times daily. 01/25/21 02/24/21  Princess Bruins, DO  ?donepezil (ARICEPT) 5 MG tablet Take 1 tablet (5 mg total) by mouth at bedtime. 01/25/21 02/24/21  Princess Bruins, DO  ?Lacosamide 150 MG TABS Take 1 tablet (150 mg total) by mouth 2 (two) times daily. 01/25/21 02/24/21  Princess Bruins, DO  ?levETIRAcetam (KEPPRA) 500 MG tablet Take 1 tablet (500 mg total) by mouth 2 (two) times  daily. 01/25/21 02/24/21  Princess Bruins, DO  ?melatonin 3 MG TABS tablet Take 1 tablet (3 mg total) by mouth at bedtime. 01/25/21   Princess Bruins, DO  ?OLANZapine zydis (ZYPREXA) 15 MG disintegrating tablet Take 1 tablet (15 mg total) by mouth at bedtime. 01/25/21 02/24/21  Princess Bruins, DO  ?OLANZapine zydis (ZYPREXA) 5 MG disintegrating tablet Take 1 tablet (5 mg total) by mouth daily. 01/26/21 02/25/21  Princess Bruins, DO  ?rosuvastatin (CRESTOR) 20 MG tablet Take 1 tablet (20 mg total) by mouth daily. 01/25/21 02/24/21  Princess Bruins, DO  ?Tiotropium Bromide Monohydrate (SPIRIVA RESPIMAT) 1.25 MCG/ACT AERS Inhale 2 puffs into the lungs daily.    [provider]  ?   ? ?Allergies    ?Contrast media [iodinated contrast media]   ? ?Review of Systems   ?Review of Systems  ?Neurological:  Positive for headaches.  ?Psychiatric/Behavioral:  Positive for hallucinations.   ? ?Physical Exam ?Updated Vital Signs ?BP 101/74 (BP Location: Right Arm)   Pulse 76   Temp 97.7 ?F (36.5 ?C) (Oral)   Resp 18   SpO2 99%  ?Physical Exam ?Vitals and nursing note reviewed.  ?Constitutional:   ?   General: He is not in acute distress. ?   Appearance: He is well-developed.  ?HENT:  ?   Head: Normocephalic and atraumatic.  ?   Mouth/Throat:  ?   Mouth: Mucous membranes are moist.  ?Eyes:  ?   General: Vision grossly intact. Gaze aligned appropriately.  ?  Extraocular Movements: Extraocular movements intact.  ?   Conjunctiva/sclera: Conjunctivae normal.  ?Cardiovascular:  ?   Rate and Rhythm: Normal rate and regular rhythm.  ?   Pulses: Normal pulses.  ?   Heart sounds: Normal heart sounds, S1 normal and S2 normal. No murmur heard. ?  No friction rub. No gallop.  ?Pulmonary:  ?   Effort: Pulmonary effort is normal. No respiratory distress.  ?   Breath sounds: Normal breath sounds.  ?Abdominal:  ?   Palpations: Abdomen is soft.  ?   Tenderness: There is no abdominal tenderness. There is no guarding or rebound.  ?   Hernia: No hernia is  present.  ?Musculoskeletal:     ?   General: No swelling.  ?   Cervical back: Full passive range of motion without pain, normal range of motion and neck supple. No pain with movement, spinous process tenderness or muscular tenderness. Normal range of motion.  ?   Right lower leg: No edema.  ?   Left lower leg: No edema.  ?Skin: ?   General: Skin is warm and dry.  ?   Capillary Refill: Capillary refill takes less than 2 seconds.  ?   Findings: No ecchymosis, erythema, lesion or wound.  ?Neurological:  ?   Mental Status: He is alert and oriented to person, place, and time.  ?   GCS: GCS eye subscore is 4. GCS verbal subscore is 5. GCS motor subscore is 6.  ?   Cranial Nerves: Cranial nerves 2-12 are intact.  ?   Sensory: Sensation is intact.  ?   Motor: Motor function is intact. No weakness or abnormal muscle tone.  ?   Coordination: Coordination is intact.  ?Psychiatric:     ?   Attention and Perception: Attention normal.     ?   Mood and Affect: Mood normal. Affect is flat.     ?   Speech: Speech normal.     ?   Behavior: Behavior is withdrawn.     ?   Thought Content: Thought content does not include homicidal or suicidal ideation.  ? ? ?ED Results / Procedures / Treatments   ?Labs ?(all labs ordered are listed, but only abnormal results are displayed) ?Labs Reviewed  ?CBC WITH DIFFERENTIAL/PLATELET - Abnormal; Notable for the following components:  ?    Result Value  ? Lymphs Abs 4.2 (*)   ? All other components within normal limits  ?BASIC METABOLIC PANEL - Abnormal; Notable for the following components:  ? Glucose, Bld 156 (*)   ? Creatinine, Ser 1.45 (*)   ? GFR, Estimated 59 (*)   ? All other components within normal limits  ?URINALYSIS, ROUTINE W REFLEX MICROSCOPIC - Abnormal; Notable for the following components:  ? Color, Urine STRAW (*)   ? Glucose, UA >=500 (*)   ? Bacteria, UA RARE (*)   ? All other components within normal limits  ?CSF CELL COUNT WITH DIFFERENTIAL - Abnormal; Notable for the following  components:  ? Appearance, CSF HAZY (*)   ? RBC Count, CSF 1,445 (*)   ? All other components within normal limits  ?PROTEIN AND GLUCOSE, CSF - Abnormal; Notable for the following components:  ? Glucose, CSF 73 (*)   ? All other components within normal limits  ?RESP PANEL BY RT-PCR (FLU A&B, COVID) ARPGX2  ?CSF CULTURE W GRAM STAIN  ?LACTIC ACID, PLASMA  ?RAPID URINE DRUG SCREEN, HOSP PERFORMED  ?ETHANOL  ?AMMONIA  ?LACTIC ACID,  PLASMA  ?CSF CELL COUNT WITH DIFFERENTIAL  ? ? ?EKG ?None ? ?Radiology ?DG Chest 2 View ? ?Result Date: 03/28/2021 ?CLINICAL DATA:  Fevers EXAM: CHEST - 2 VIEW COMPARISON:  08/14/2020 FINDINGS: Cardiac shadows within normal limits. Mild bibasilar airspace opacities are noted which may represent atelectasis or early infiltrate given the clinical history. No sizable effusion is noted. No bony abnormality is noted. IMPRESSION: Mild bibasilar airspace opacities. Electronically Signed   By: Alcide CleverMark  Lukens M.D.   On: 03/28/2021 22:45  ? ?CT HEAD WO CONTRAST (5MM) ? ?Result Date: 03/28/2021 ?CLINICAL DATA:  Altered mental status EXAM: CT HEAD WITHOUT CONTRAST TECHNIQUE: Contiguous axial images were obtained from the base of the skull through the vertex without intravenous contrast. RADIATION DOSE REDUCTION: This exam was performed according to the departmental dose-optimization program which includes automated exposure control, adjustment of the mA and/or kV according to patient size and/or use of iterative reconstruction technique. COMPARISON:  02/17/2021 FINDINGS: Brain: No evidence of acute infarction, hemorrhage, hydrocephalus, extra-axial collection or mass lesion/mass effect. Vascular: No hyperdense vessel or unexpected calcification. Skull: Normal. Negative for fracture or focal lesion. Sinuses/Orbits: No acute finding. Other: Old bilateral zygomatic arch fractures are seen. IMPRESSION: No acute intracranial abnormality noted. Electronically Signed   By: Alcide CleverMark  Lukens M.D.   On: 03/28/2021 23:49    ? ?Procedures ?.Lumbar Puncture ? ?Date/Time: 03/29/2021 7:18 AM ?Performed by: Gilda CreasePollina, Neetu Carrozza J, MD ?Authorized by: Gilda CreasePollina, Haly Feher J, MD  ? ?Consent:  ?  Consent obtained:  Verbal ?  Consent gi

## 2021-03-28 NOTE — ED Triage Notes (Signed)
Pt brought to ED by caregiver from group home for evaluaztion after eloping earlier and returning to home with odd behavior. Pt actively rambling with rapid and at times incomprehensible speech.  ?

## 2021-03-28 NOTE — ED Notes (Signed)
Pt to CT

## 2021-03-29 ENCOUNTER — Emergency Department (HOSPITAL_COMMUNITY): Payer: No Typology Code available for payment source

## 2021-03-29 LAB — CSF CELL COUNT WITH DIFFERENTIAL
RBC Count, CSF: 1445 /mm3 — ABNORMAL HIGH
RBC Count, CSF: 46 /mm3 — ABNORMAL HIGH
Tube #: 3
Tube #: 1
WBC, CSF: 3 /mm3 (ref 0–5)
WBC, CSF: 2 /mm3 (ref 0–5)

## 2021-03-29 LAB — LACTIC ACID, PLASMA: Lactic Acid, Venous: 1.1 mmol/L (ref 0.5–1.9)

## 2021-03-29 LAB — PROTEIN AND GLUCOSE, CSF
Glucose, CSF: 73 mg/dL — ABNORMAL HIGH (ref 40–70)
Total  Protein, CSF: 28 mg/dL (ref 15–45)

## 2021-03-29 MED ORDER — IOHEXOL 350 MG/ML SOLN
75.0000 mL | Freq: Once | INTRAVENOUS | Status: AC | PRN
  Administered 2021-03-29: 75 mL via INTRAVENOUS

## 2021-03-29 NOTE — ED Notes (Signed)
Pt belonging placed in locker #3 ?

## 2021-03-29 NOTE — ED Provider Notes (Signed)
Patient signed out to me by previous provider. Please refer to their note for full HPI.  Briefly this is a 51 year old male who presented with concern for "acting strange".  Admitted to smoking marijuana, has baseline psychiatric disorder.  Was febrile and tachycardic on arrival.  Work-up has otherwise been reassuring however due to the fever, change in mental status lumbar puncture was done.  At my time of signout CSF tube 3 had an elevated amount of red blood cells.  Concern for possible SAH.  Plan to reevaluate RBCs in CSF tube 1, otherwise patient's vitals have normalized, he has no complaint of headache and appears to be baseline.   ?Physical Exam  ?BP 111/78   Pulse (!) 56   Temp 97.7 ?F (36.5 ?C) (Oral)   Resp 15   SpO2 98%  ? ?Physical Exam ?Vitals and nursing note reviewed.  ?Constitutional:   ?   General: He is not in acute distress. ?HENT:  ?   Head: Normocephalic.  ?   Mouth/Throat:  ?   Mouth: Mucous membranes are moist.  ?Cardiovascular:  ?   Rate and Rhythm: Normal rate.  ?Pulmonary:  ?   Effort: Pulmonary effort is normal. No respiratory distress.  ?Skin: ?   General: Skin is warm.  ?Neurological:  ?   Mental Status: He is alert and oriented to person, place, and time.  ?Psychiatric:     ?   Mood and Affect: Mood normal.  ? ? ?Procedures  ?Procedures ? ?ED Course / MDM  ?  ?Medical Decision Making ?Amount and/or Complexity of Data Reviewed ?Labs: ordered. ?Radiology: ordered. ? ?Risk ?Prescription drug management. ? ? ?RBCs in tube 1 was 46, with a increase going into tube 3.  Could be suspicious for [facility] versus traumatic tap.  CTA of the head and neck shows no aneurysm, bleed or other acute finding.  Of note patient did have an IV contrast allergy listed but after further review this allergy is to gadolinium and not CT contrast.  No pretreatment was required, no allergic reaction to CTA.  Patient is at this point medically cleared for psychiatric evaluation/recommendations. ? ? ? ? ?  ?Lorelle Gibbs, DO ?03/29/21 1305 ? ?

## 2021-03-29 NOTE — ED Notes (Signed)
Sitter at bedside.

## 2021-03-29 NOTE — ED Notes (Signed)
Cords removed from the room  ?

## 2021-03-29 NOTE — ED Notes (Signed)
Patient transported to CT 

## 2021-03-29 NOTE — ED Notes (Signed)
Patient returned from imaging, sitter remains with patient at bedside. ?

## 2021-03-29 NOTE — ED Notes (Signed)
Staffing called again regarding sitter, RN informed that the sitter should be arriving around 0030  ?

## 2021-03-29 NOTE — BH Assessment (Addendum)
Comprehensive Clinical Assessment (CCA) Note  03/29/2021 Daniel Foley [id number]  DISPOSITION: Per White NP patient will be observed and monitored until additional information from group home can be obtained.  Flowsheet Row ED from 03/28/2021 in [facility] EMERGENCY DEPARTMENT ED from 03/16/2021 in [facility] ED from 02/17/2021 in [facility]  C-SSRS RISK CATEGORY No Risk No Risk No Risk      The patient demonstrates the following risk factors for suicide: Chronic risk factors for suicide include: N/A. Acute risk factors for suicide include: N/A. Protective factors for this patient include: coping skills. Considering these factors, the overall suicide risk at this point appears to be low. Patient is not appropriate for outpatient follow up.   Patient is a 51 year old male that presents this date with IVC initiated on arrival after patient displayed bizarre behaviors. Patient is from [facility] and reportedly left that property without permission and returned "acting funny." This Clinical research associate attempted to reach group home owner Campbell Stall caregiver [phone] unsuccessfully this date to gather additional information. It does not appear that patient has a guardian although further investigation is needed. Patient has a history per chart significant for Schizophrenia, chronic kidney disease, diabetes and seizures. Per notes patient also has a history of medication non compliance and aggressive behaviors at his group home. Patient was last seen on 01/08/21 when he presented to [facility] with IVC at that time also for delusions and aggressive behavior at group home. Patient this date presents with altered mental state reporting that he "smoked some bad weed" although it is unclear where patient obtained that substance from. Patient denies any S/I or H/I although when asked in reference to AVH states, "I need to think about  that." Patient states he "went out of the house" and "can't remember what happened next." Patient is tangential and does not seem to process the content of this writer's questions. Patient is giving answers unrelated to this writer's questions. when asked in reference to orientation states "its year 2000." Patient does not render any additional history.   Patient is oriented to person and place only. Patient states the current year is [birth year]. Patient speaks in a low soft voice that is difficult to understand. Patient renders limited history and is difficult to redirect. Patient is tangential and is giving answers unrelated to this writer's questions.              Chief Complaint:  Chief Complaint  Patient presents with   Psychiatric Evaluation   Visit Diagnosis: Schizophrenia     CCA Screening, Triage and Referral (STR)  Patient Reported Information How did you hear about [place]? Self  What Is the Reason for Your Visit/Call Today? pt presents with altered mental state reporting he "smoked some bad weed"  How Long Has This Been Causing You Problems? <Week  What Do You Feel Would Help You the Most Today? Alcohol or Drug Use Treatment   Have You Recently Had Any Thoughts About Hurting Yourself? No  Are You Planning to Commit Suicide/Harm Yourself At This time? No   Have you Recently Had Thoughts About Hurting Someone Karolee Ohs? No  Are You Planning to Harm Someone at This Time? No  Explanation: No data recorded  Have You Used Any Alcohol or Drugs in the Past 24 Hours? Yes  How Long Ago Did You Use Drugs or Alcohol? No data recorded What Did You Use and How Much? Pt states he "  smoked bad weed" pt is vague in reference to amount used and time frame   Do You Currently Have a Therapist/Psychiatrist? No  Name of Therapist/Psychiatrist: No data recorded  Have You Been Recently Discharged From Any Office Practice or Programs? No  Explanation of Discharge From Practice/Program: No data  recorded    CCA Screening Triage Referral Assessment Type of Contact: Face-to-Face  Telemedicine Service Delivery:   Is this Initial or Reassessment? Initial Assessment  Date Telepsych consult ordered in [facility]:  01/08/21  Time Telepsych consult ordered in [facility]:  1908  Location of Assessment: [facility] ED  Provider Location: Other (comment) ([facility])   Collateral Involvement: None at this time   Does Patient Have a Court Appointed Legal Guardian? No data recorded Name and Contact of Legal Guardian: No data recorded If Minor and Not Living with Parent(s), Who has Custody? NA  Is CPS involved or ever been involved? Never  Is APS involved or ever been involved? Never   Patient Determined To Be At Risk for Harm To Self or Others Based on Review of Patient Reported Information or Presenting Complaint? No  Method: No data recorded Availability of Means: No data recorded Intent: No data recorded Notification Required: No data recorded Additional Information for Danger to Others Potential: No data recorded Additional Comments for Danger to Others Potential: No data recorded Are There Guns or Other Weapons in Your Home? No data recorded Types of Guns/Weapons: No data recorded Are These Weapons Safely Secured?                            No data recorded Who Could Verify You Are Able To Have These Secured: No data recorded Do You Have any Outstanding Charges, Pending Court Dates, Parole/Probation? No data recorded Contacted To Inform of Risk of Harm To Self or Others: Other: Comment (NA)    Does Patient Present under Involuntary Commitment? Yes  IVC Papers Initial File Date: 03/29/21   [place] of Residence: [place]   Patient Currently Receiving the Following Services: Medication Management   Determination of Need: Emergent (2 hours)   Options For Referral: Inpatient Hospitalization     CCA Biopsychosocial Patient Reported Schizophrenia/Schizoaffective Diagnosis in Past:  No   Strengths: uta   Mental Health Symptoms Depression:   -- [name])   Duration of Depressive symptoms:  Duration of Depressive Symptoms: -- (UTA)   Mania:   None   Anxiety:    None   Psychosis:   Delusions   Duration of Psychotic symptoms:  Duration of Psychotic Symptoms: Less than six months   Trauma:   -- (uta)   Obsessions:   -- (uta)   Compulsions:   -- (uta)   Inattention:   None   Hyperactivity/Impulsivity:   None   Oppositional/Defiant Behaviors:   None   Emotional Irregularity:   None   Other Mood/Personality Symptoms:  No data recorded   Mental Status Exam Appearance and self-care  Stature:   Average   Weight:   Average weight   Clothing:   Casual   Grooming:   Normal   Cosmetic use:   None   Posture/gait:   Normal   Motor activity:   Not Remarkable   Sensorium  Attention:   Normal   Concentration:   Focuses on irrelevancies   Orientation:   -- [name])   Recall/memory:   Defective in Remote; Defective in Recent; Defective in Short-term; Defective in Immediate   Affect  and Mood  Affect:   Appropriate   Mood:   Anxious   Relating  Eye contact:   Normal   Facial expression:   Tense   Attitude toward examiner:   Cooperative   Thought and Language  Speech flow:  Garbled; Soft   Thought content:   Delusions   Preoccupation:   None   Hallucinations:   -- [name])   Organization:  No data recorded  [employer] of Knowledge:   Average   Intelligence:   Average   Abstraction:   Abstract   Judgement:   Impaired   Reality Testing:   Distorted   Insight:   Lacking   Decision Making:   Impulsive   Social Functioning  Social Maturity:   Impulsive   Social Judgement:   Heedless   Stress  Stressors:   Illness   Coping Ability:   Contractor Deficits:   Self-control; Scientist, physiological; Communication   Supports:   Support needed      Religion: Religion/Spirituality Are You A Religious Person?:  Industrial/product designer)  Leisure/Recreation: Leisure / Recreation Do You Have Hobbies?:  (UTA) Leisure and Hobbies: UTA  Exercise/Diet: Exercise/Diet Do You Exercise?:  (uta) Have You Gained or Lost A Significant Amount of Weight in the Past Six Months?:  (uta) Do You Follow a Special Diet?:  (uta) Do You Have Any Trouble Sleeping?:  (uta)   CCA Employment/Education Employment/Work Situation: Employment / Work Situation Employment Situation: On disability Why is Patient on Disability: Mental and Physical How Long has Patient Been on Disability: 5 years Patient's Job has Been Impacted by Current Illness: No Has Patient ever Been in the [employer]?: No  Education: Education Is Patient Currently Attending School?: No Last Grade Completed: 10 Did You Attend College?: No Did You Have An Individualized Education Program (IIEP): No [name]) Did You Have Any Difficulty At School?: No (uta) Patient's Education Has Been Impacted by Current Illness: No   CCA Family/Childhood History Family and Relationship History: Family history Does patient have children?: Yes How many children?: 2 How is patient's relationship with their children?: UTA  Childhood History:  Childhood History By whom was/is the patient raised?: Mother Did patient suffer any verbal/emotional/physical/sexual abuse as a child?: No Did patient suffer from severe childhood neglect?: No Has patient ever been sexually abused/assaulted/raped as an adolescent or adult?: No Was the patient ever a victim of a crime or a disaster?: No Witnessed domestic violence?: No Has patient been affected by domestic violence as an adult?: No  Child/Adolescent Assessment:     CCA Substance Use Alcohol/Drug Use: Alcohol / Drug Use Pain Medications: see MAR Prescriptions: see MAR Over the Counter: see MAR History of alcohol / drug use?: Yes Longest period of sobriety (when/how  long): Unknown Negative Consequences of Use:  (UTA) Withdrawal Symptoms: None Substance #1 Name of Substance 1: Cannabis 1 - Age of First Use: UTA 1 - Amount (size/oz): Pt states he "smoked bad weed" amount time frame unknown 1 - Frequency: UTA 1 - Duration: UTA 1 - Last Use / Amount: pt states prior to arrival although is vague in reference to amount used/time frame 1- Route of Use: Smoked                       ASAM's:  Six Dimensions of Multidimensional Assessment  Dimension 1:  Acute Intoxication and/or Withdrawal Potential:   Dimension 1:  Description of individual's past and current experiences of substance  use and withdrawal: 1  Dimension 2:  Biomedical Conditions and Complications:   Dimension 2:  Description of patient's biomedical conditions and  complications: 1  Dimension 3:  Emotional, Behavioral, or Cognitive Conditions and Complications:  Dimension 3:  Description of emotional, behavioral, or cognitive conditions and complications: 2  Dimension 4:  Readiness to Change:  Dimension 4:  Description of Readiness to Change criteria: 2  Dimension 5:  Relapse, Continued use, or Continued Problem Potential:  Dimension 5:  Relapse, continued use, or continued problem potential critiera description: 2  Dimension 6:  Recovery/Living Environment:  Dimension 6:  Recovery/Iiving environment criteria description: 1  ASAM Severity Score: ASAM's Severity Rating Score: 9  ASAM Recommended Level of Treatment:     Substance use Disorder (SUD) Substance Use Disorder (SUD)  Checklist Symptoms of Substance Use: Continued use despite having a persistent/recurrent physical/psychological problem caused/exacerbated by use  Recommendations for Services/Supports/Treatments:    Discharge Disposition:    DSM5 Diagnoses: Patient Active Problem List   Diagnosis Date Noted   Gynecomastia, male 01/18/2021   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

## 2021-03-30 DIAGNOSIS — R4689 Other symptoms and signs involving appearance and behavior: Secondary | ICD-10-CM

## 2021-03-30 NOTE — ED Notes (Signed)
Breakfast arrived ?

## 2021-03-30 NOTE — ED Notes (Signed)
Patient has remained calm and cooperative. Took a shower this morning and has been pleasantly sitting on the side of the bed speaking with sitter. ?

## 2021-03-30 NOTE — Care Management (Signed)
Group home owner called to state that his car broke down on the way and he could not get him. Called safe transort, they stated while they are "24/7" they ddo not do weekends, or after 6pm. Called TOC supervisor on call for direction. ?

## 2021-03-30 NOTE — ED Notes (Signed)
Patient's belongings returned. Patient to return to group home via [place] provided by group home.  ?

## 2021-03-30 NOTE — Consult Note (Signed)
Telepsych Consultation  ? ?Reason for Consult:  Psychiatric Reassessment ?Referring Physician:  Dr. Jaci Carrel ?Location of Patient:   [facility] ED ?Location of Provider: Other: virtual home office ? ?Patient Identification: Daniel Foley ?MRN:  [id number] ?Principal Diagnosis: Altered behavior ?Diagnosis:  Principal Problem: ?  Altered behavior ?Active Problems: ?  Schizophrenia (HCC) ? ? ?Total Time spent with patient: 30 minutes ? ?Subjective:   ?Daniel Foley is a 51 y.o. male patient admitted to [facility] ED for evaluation of bizarre behavior. Patient seen today and states, "I just want to go home." ? ?HPI:   ?Patient seen via telepsych by this provider; chart reviewed and consulted with Dr. Sherron Flemings on 03/30/21.  On evaluation Daniel Foley is laying in bed but sits up and faces the camera when asked by nursing staff to participate.  He states his name, location and able to states the date.  He is clear and coherent, appears much improved since one day prior when he thought the year was 2020.  Patient collaborates most of what has already been captured in [facility].  At baseline he has schizophrenia, lives in a group home and is sometimes non-compliant with psychiatric medications.  He reports he had "weed" prior to admission and was more than confused than usual.  Today, he states he's cleared up, feels he's at his baseline and ready to go home.  On admission, he denies suicidal or homicidal ideations and does not endorse now.  He denies audible or visual hallucinations and does not appear to be responding to internal stimulus.   ? ?Patient reports he slept "okay" last night; denies appetite concerns and is able to state what he had for breakfast this morning. At admission, he UDS was negative; CMP with WNL with the exception of elevated blood sugar (does not appear he was fasting) and elevated creatinine levels- patient has dx for chronic kidney disease.  Head CT was  negative for acute concerns.  The ED provider did not believe there was any medical contribution to his symptoms and he was medically cleared. Per record review his psychiatric medication expired 2 months prior, they were not restarted since admission.  ? ?Several attempts to reach Group home, Waynetta Sandy and Curley Spice [phone] and [phone].  SW has also made unsuccessful attempts to contact above since yesterday.  ? ? ?Per ED Provider Admission Assessment 03/28/2021: ?Chief Complaint  ?Patient presents with  ? Psychiatric Evaluation  ?  ?  ?Daniel Foley is a 51 y.o. male. ?  ?Patient presents to the emergency department for odd behavior.  Patient normally resides in a group home.  Patient apparently wandered away from the group home and when he returned he was exhibiting bizarre behavior. ?  ?Patient reports that he has a headache currently, no other complaints.  He does endorse hearing voices but he cannot determine what they are saying.  Denies homicidality, suicidality. ? ? ?Past Psychiatric History: Schizophrenia ? ?Risk to Self:  no ?Risk to Others:  no ?Prior Inpatient Therapy:  unknown ?Prior Outpatient Therapy:  yes ? ?Past Medical History:  ?Past Medical History:  ?Diagnosis Date  ? Chest pain 08/15/2020  ? Chronic kidney disease   ? Diabetes mellitus without complication (HCC)   ? Neuromuscular disorder (HCC)   ? Observed seizure-like activity (HCC) 06/22/2020  ? Schizophrenia (HCC)   ? Witnessed seizure-like activity (HCC) 06/21/2020  ? No past surgical history on file. ?Family History:  ?Family History  ?Problem  Relation Age of Onset  ? Seizures Brother   ? ?Family Psychiatric  History: unknown ?Social History:  ?Social History  ? ?Substance and Sexual Activity  ?Alcohol Use Yes  ?   ?Social History  ? ?Substance and Sexual Activity  ?Drug Use Not Currently  ?  ?Social History  ? ?Socioeconomic History  ? Marital status: Single  ?  Spouse name: Not on file  ? Number of children: Not on file   ? Years of education: Not on file  ? Highest education level: Not on file  ?Occupational History  ? Not on file  ?Tobacco Use  ? Smoking status: Every Day  ?  Packs/day: 0.25  ?  Years: 0.50  ?  Pack years: 0.13  ?  Types: Cigarettes  ? Smokeless tobacco: Never  ?Vaping Use  ? Vaping Use: Never used  ?Substance and Sexual Activity  ? Alcohol use: Yes  ? Drug use: Not Currently  ? Sexual activity: Not Currently  ?  Birth control/protection: Condom  ?Other Topics Concern  ? Not on file  ?Social History Narrative  ? Not on file  ? ?Social Determinants of Health  ? ?Financial Resource Strain: Not on file  ?Food Insecurity: Not on file  ?Transportation Needs: Not on file  ?Physical Activity: Not on file  ?Stress: Not on file  ?Social Connections: Not on file  ? ?Additional Social History: ?  ? ?Allergies:   ?Allergies  ?Allergen Reactions  ? Gadolinium Derivatives Rash  ?  "Burning, no itching" with MRI contrast, not CT  ? ? ?Labs:  ?Results for orders placed or performed during the hospital encounter of 03/28/21 (from the past 48 hour(s))  ?Resp Panel by RT-PCR (Flu A&B, Covid) Nasopharyngeal Swab     Status: None  ? Collection Time: 03/28/21  9:30 PM  ? Specimen: Nasopharyngeal Swab; Nasopharyngeal(NP) swabs in vial transport medium  ?Result Value Ref Range  ? SARS Coronavirus 2 by RT PCR NEGATIVE NEGATIVE  ?  Comment: (NOTE) ?SARS-CoV-2 target nucleic acids are NOT DETECTED. ? ?The SARS-CoV-2 RNA is generally detectable in upper respiratory ?specimens during the acute phase of infection. The lowest ?concentration of SARS-CoV-2 viral copies this assay can detect is ?138 copies/mL. A negative result does not preclude SARS-Cov-2 ?infection and should not be used as the sole basis for treatment or ?other patient management decisions. A negative result may occur with  ?improper specimen collection/handling, submission of specimen other ?than nasopharyngeal swab, presence of viral mutation(s) within the ?areas targeted by  this assay, and inadequate number of viral ?copies(<138 copies/mL). A negative result must be combined with ?clinical observations, patient history, and epidemiological ?information. The expected result is Negative. ? ?Fact Sheet for Patients:  ?BloggerCourse.comhttps://www.fda.gov/media/152166/download ? ?Fact Sheet for Healthcare Providers:  ?SeriousBroker.ithttps://www.fda.gov/media/152162/download ? ?This test is no t yet approved or cleared by the [place] FDA and  ?has been authorized for detection and/or diagnosis of SARS-CoV-2 by ?FDA under an Emergency Use Authorization (EUA). This EUA will remain  ?in effect (meaning this test can be used) for the duration of the ?COVID-19 declaration under Section 564(b)(1) of the Act, 21 ?U.S.C.section 360bbb-3(b)(1), unless the authorization is terminated  ?or revoked sooner.  ? ? ?  ? Influenza A by PCR NEGATIVE NEGATIVE  ? Influenza B by PCR NEGATIVE NEGATIVE  ?  Comment: (NOTE) ?The Xpert Xpress SARS-CoV-2/FLU/RSV plus assay is intended as an aid ?in the diagnosis of influenza from Nasopharyngeal swab specimens and ?should not be used as a  sole basis for treatment. Nasal washings and ?aspirates are unacceptable for Xpert Xpress SARS-CoV-2/FLU/RSV ?testing. ? ?Fact Sheet for Patients: ?BloggerCourse.com ? ?Fact Sheet for Healthcare Providers: ?SeriousBroker.it ? ?This test is not yet approved or cleared by the [place] FDA and ?has been authorized for detection and/or diagnosis of SARS-CoV-2 by ?FDA under an Emergency Use Authorization (EUA). This EUA will remain ?in effect (meaning this test can be used) for the duration of the ?COVID-19 declaration under Section 564(b)(1) of the Act, 21 U.S.C. ?section 360bbb-3(b)(1), unless the authorization is terminated or ?revoked. ? ?Performed at [facility], [address], [place], [place] ?[address] ?  ?Urine rapid drug screen (hosp performed)     Status: None  ? Collection Time: 03/28/21   9:30 PM  ?Result Value Ref Range  ? Opiates NONE DETECTED NONE DETECTED  ? Cocaine NONE DETECTED NONE DETECTED  ? Benzodiazepines NONE DETECTED NONE DETECTED  ? Amphetamines NONE DETECTED NONE DETECTED

## 2021-03-30 NOTE — ED Provider Notes (Signed)
Emergency Medicine Observation Re-evaluation Note ? ?Daniel Foley is a 51 y.o. male, seen on rounds today.  Pt initially presented to the ED for complaints of Psychiatric Evaluation ?Currently, the patient is resting in his room. ? ?Physical Exam  ?BP 107/74 (BP Location: Left Arm)   Pulse 61   Temp 97.9 ?F (36.6 ?C) (Oral)   Resp 16   SpO2 98%  ?Physical Exam ?General: resting comfortably, NAD ?Lungs: normal WOB ?Psych: currently calm and resting ? ?ED Course / MDM  ?EKG:  ? ?I have reviewed the labs performed to date as well as medications administered while in observation.  Recent changes in the last 24 hours include none. ? ?Plan  ?Current plan is for behavioral health to observe and obtain more information from the group home, possible discharge back to facility. ? Daniel Foley is not under involuntary commitment. ? ? ?  ?Daniel Logan, DO ?03/30/21 1138 ? ?

## 2021-03-30 NOTE — ED Notes (Signed)
Patient on TTS 

## 2021-03-30 NOTE — Progress Notes (Signed)
CSW contacted group home owner regarding patient being cleared for discharge. Group home owner verbalized understanding and reported he will be on the way from [facility] to come pick patient up.  ?

## 2021-03-30 NOTE — ED Provider Notes (Signed)
Group, has been contacted and patient can be discharged back to his facility. ?  [name], DO ?03/30/21 1734 ? ?

## 2021-03-30 NOTE — ED Notes (Signed)
Sitter to waiting room with pt while waiting for uber sent by group home.  ?

## 2021-03-30 NOTE — Consult Note (Addendum)
Several attempts made to reach group home for collateral.  I have been unable to reach anyone, thus I see no reason to keep Mr. Bicknell in the hospital.  Per [facility] notes, Sw has also made attempts to speak with someone since yesterday  ?03/29/21 without success. ? ?Patient is clear, coherent, A&Ox4, improved mental status since admission and no longer meets criteria for psychiatric admission.  He is psych cleared. ?

## 2021-04-01 LAB — CSF CULTURE W GRAM STAIN
Culture: NO GROWTH
Gram Stain: NONE SEEN

## 2021-06-21 ENCOUNTER — Other Ambulatory Visit: Payer: Self-pay

## 2021-06-21 ENCOUNTER — Encounter (HOSPITAL_COMMUNITY): Payer: Self-pay

## 2021-06-21 ENCOUNTER — Emergency Department (HOSPITAL_COMMUNITY)
Admission: EM | Admit: 2021-06-21 | Discharge: 2021-06-21 | Disposition: A | Discharge status: Home / Self Care | Payer: Medicaid Other | Attending: Emergency Medicine | Admitting: Emergency Medicine

## 2021-06-21 DIAGNOSIS — R569 Unspecified convulsions: Secondary | ICD-10-CM | POA: Diagnosis present

## 2021-06-21 DIAGNOSIS — S01512A Laceration without foreign body of oral cavity, initial encounter: Secondary | ICD-10-CM | POA: Diagnosis not present

## 2021-06-21 DIAGNOSIS — E119 Type 2 diabetes mellitus without complications: Secondary | ICD-10-CM | POA: Insufficient documentation

## 2021-06-21 DIAGNOSIS — X58XXXA Exposure to other specified factors, initial encounter: Secondary | ICD-10-CM | POA: Diagnosis not present

## 2021-06-21 LAB — URINALYSIS, ROUTINE W REFLEX MICROSCOPIC
Bilirubin Urine: NEGATIVE
Glucose, UA: >=500 mg/dL — AB
Hgb urine dipstick: NEGATIVE
Ketones, ur: NEGATIVE mg/dL
Leukocytes,Ua: NEGATIVE
Nitrite: NEGATIVE
Protein, ur: NEGATIVE mg/dL
Specific Gravity, Urine: 1.017 (ref 1.005–1.030)
pH: 5 (ref 5.0–8.0)

## 2021-06-21 LAB — COMPREHENSIVE METABOLIC PANEL
ALT: 23 U/L (ref 0–44)
AST: 32 U/L (ref 15–41)
Albumin: 3.9 g/dL (ref 3.5–5.0)
Alkaline Phosphatase: 51 U/L (ref 38–126)
Anion gap: 12 (ref 5–15)
BUN: 14 mg/dL (ref 6–20)
CO2: 21 mmol/L — ABNORMAL LOW (ref 22–32)
Calcium: 10 mg/dL (ref 8.9–10.3)
Chloride: 104 mmol/L (ref 98–111)
Creatinine, Ser: 1.22 mg/dL (ref 0.61–1.24)
GFR, Estimated: >60 mL/min (ref >60)
Glucose, Bld: 113 mg/dL — ABNORMAL HIGH (ref 70–99)
Potassium: 4.2 mmol/L (ref 3.5–5.1)
Sodium: 137 mmol/L (ref 135–145)
Total Bilirubin: 0.5 mg/dL (ref 0.3–1.2)
Total Protein: 7.9 g/dL (ref 6.5–8.1)

## 2021-06-21 LAB — CBC WITH DIFFERENTIAL/PLATELET
Abs Immature Granulocytes: 0.03 10*3/uL (ref 0.00–0.07)
Basophils Absolute: 0 10*3/uL (ref 0.0–0.1)
Basophils Relative: 0 %
Eosinophils Absolute: 0.1 10*3/uL (ref 0.0–0.5)
Eosinophils Relative: 1 %
HCT: 47.7 % (ref 39.0–52.0)
Hemoglobin: 16.2 g/dL (ref 13.0–17.0)
Immature Granulocytes: 0 %
Lymphocytes Relative: 41 %
Lymphs Abs: 2.7 10*3/uL (ref 0.7–4.0)
MCH: 29.9 pg (ref 26.0–34.0)
MCHC: 34 g/dL (ref 30.0–36.0)
MCV: 88 fL (ref 80.0–100.0)
Monocytes Absolute: 0.5 10*3/uL (ref 0.1–1.0)
Monocytes Relative: 7 %
Neutro Abs: 3.4 10*3/uL (ref 1.7–7.7)
Neutrophils Relative %: 51 %
Platelets: 261 10*3/uL (ref 150–400)
RBC: 5.42 MIL/uL (ref 4.22–5.81)
RDW: 14.3 % (ref 11.5–15.5)
WBC: 6.7 10*3/uL (ref 4.0–10.5)
nRBC: 0 % (ref 0.0–0.2)

## 2021-06-21 LAB — RAPID URINE DRUG SCREEN, HOSP PERFORMED
Amphetamines: NOT DETECTED
Barbiturates: NOT DETECTED
Benzodiazepines: NOT DETECTED
Cocaine: NOT DETECTED
Opiates: NOT DETECTED
Tetrahydrocannabinol: NOT DETECTED

## 2021-06-21 LAB — CBG MONITORING, ED: Glucose-Capillary: 123 mg/dL — ABNORMAL HIGH (ref 70–99)

## 2021-06-21 MED ORDER — LEVETIRACETAM IN NACL 1000 MG/100ML IV SOLN: 1000.0000 mg | Freq: Once | INTRAVENOUS | Status: AC

## 2021-06-21 MED ORDER — LEVETIRACETAM 500 MG PO TABS: 500.0000 mg | ORAL_TABLET | Freq: Two times a day (BID) | ORAL | 0 refills | Status: AC

## 2021-06-21 MED ORDER — LEVETIRACETAM IN NACL 1000 MG/100ML IV SOLN
INTRAVENOUS | Status: AC
  Administered 2021-06-21: 1000 mg via INTRAVENOUS
  Filled 2021-06-21: qty 100

## 2021-06-21 MED ORDER — DIVALPROEX SODIUM 250 MG PO DR TAB
750.0000 mg | DELAYED_RELEASE_TABLET | Freq: Once | ORAL | Status: AC
  Administered 2021-06-21: 750 mg via ORAL
  Filled 2021-06-21: qty 3

## 2021-06-21 MED ORDER — SODIUM CHLORIDE 0.9 % IV BOLUS
1000.0000 mL | Freq: Once | INTRAVENOUS | Status: AC
  Administered 2021-06-21: 1000 mL via INTRAVENOUS

## 2021-06-21 MED ORDER — DIVALPROEX SODIUM 250 MG PO DR TAB: 750.0000 mg | DELAYED_RELEASE_TABLET | Freq: Two times a day (BID) | ORAL | 0 refills | Status: AC

## 2021-06-21 NOTE — ED Provider Notes (Signed)
[facility] EMERGENCY DEPARTMENT Provider Note   CSN: [id number] Arrival date & time: 06/21/21  1524     History {Add pertinent medical, surgical, social history, OB history to HPI:1} Chief Complaint  Patient presents with  . Seizures    Daniel Foley is a 51 y.o. male.  The history is provided by the patient and medical records. No language interpreter was used.  Seizures Seizure activity on arrival: no   Seizure type:  Unable to specify Preceding symptoms: no headache, no nausea and no vision change   Initial focality:  None Postictal symptoms: somnolence   Return to baseline: yes   Progression:  Unchanged Context: drug use and medical non-compliance   Context: not alcohol withdrawal   Recent head injury:  No recent head injuries PTA treatment:  None History of seizures: yes        Home Medications Prior to Admission medications   Medication Sig Start Date End Date Taking? Authorizing Provider  albuterol (VENTOLIN HFA) 108 (90 Base) MCG/ACT inhaler Inhale 2 puffs into the lungs every 4 (four) hours as needed for shortness of breath.    [provider]  amantadine (SYMMETREL) 100 MG capsule Take 1 capsule (100 mg total) by mouth every other day. 01/27/21 02/26/21  Princess Bruins, DO  ARIPiprazole (ABILIFY) 5 MG tablet Take 1 tablet (5 mg total) by mouth at bedtime. 01/25/21 02/24/21  Princess Bruins, DO  budesonide-formoterol (SYMBICORT) 160-4.5 MCG/ACT inhaler Inhale 2 puffs into the lungs 2 (two) times daily.    [provider]  dapagliflozin propanediol (FARXIGA) 10 MG TABS tablet Take 1 tablet (10 mg total) by mouth daily. 01/25/21 02/24/21  Princess Bruins, DO  divalproex (DEPAKOTE) 250 MG DR tablet Take 3 tablets (750 mg total) by mouth 2 (two) times daily. 01/25/21 02/24/21  Princess Bruins, DO  donepezil (ARICEPT) 5 MG tablet Take 1 tablet (5 mg total) by mouth at bedtime. 01/25/21 02/24/21  Princess Bruins, DO  Lacosamide 150 MG TABS Take  1 tablet (150 mg total) by mouth 2 (two) times daily. 01/25/21 02/24/21  Princess Bruins, DO  levETIRAcetam (KEPPRA) 500 MG tablet Take 1 tablet (500 mg total) by mouth 2 (two) times daily. 01/25/21 02/24/21  Princess Bruins, DO  melatonin 3 MG TABS tablet Take 1 tablet (3 mg total) by mouth at bedtime. 01/25/21   Princess Bruins, DO  OLANZapine zydis (ZYPREXA) 15 MG disintegrating tablet Take 1 tablet (15 mg total) by mouth at bedtime. 01/25/21 02/24/21  Princess Bruins, DO  OLANZapine zydis (ZYPREXA) 5 MG disintegrating tablet Take 1 tablet (5 mg total) by mouth daily. 01/26/21 02/25/21  Princess Bruins, DO  rosuvastatin (CRESTOR) 20 MG tablet Take 1 tablet (20 mg total) by mouth daily. 01/25/21 02/24/21  Princess Bruins, DO  Tiotropium Bromide Monohydrate (SPIRIVA RESPIMAT) 1.25 MCG/ACT AERS Inhale 2 puffs into the lungs daily.    [provider]      Allergies    Gadolinium derivatives    Review of Systems   Review of Systems  Constitutional:  Negative for chills, diaphoresis, fatigue and fever.  HENT:  Negative for congestion and sore throat.   Eyes:  Negative for visual disturbance.  Respiratory:  Negative for cough, chest tightness and shortness of breath.   Cardiovascular:  Negative for chest pain.  Gastrointestinal:  Negative for abdominal pain, constipation, diarrhea, nausea and vomiting.  Genitourinary:  Negative for dysuria and flank pain.  Musculoskeletal:  Negative for back pain, neck pain and neck stiffness.  Skin:  Positive for wound. Negative for rash.  Neurological:  Positive for seizures. Negative for dizziness, weakness, light-headedness and headaches.  Psychiatric/Behavioral:  Negative for agitation and confusion.   All other systems reviewed and are negative.   Physical Exam Updated Vital Signs Pulse 98   Temp 99.3 F (37.4 C) (Oral)   Resp 18   SpO2 98%  Physical Exam Vitals and nursing note reviewed.  Constitutional:      General: He is not in acute distress.     Appearance: He is well-developed.  HENT:     Nose: No congestion or rhinorrhea.     Mouth/Throat:     Mouth: Mucous membranes are dry. Injury and lacerations present. No oral lesions or angioedema.     Tongue: Tongue does not deviate from midline.     Pharynx: No pharyngeal swelling, oropharyngeal exudate or uvula swelling.     Tonsils: No tonsillar exudate or tonsillar abscesses.   Eyes:     Extraocular Movements: Extraocular movements intact.     Conjunctiva/sclera: Conjunctivae normal.     Pupils: Pupils are equal, round, and reactive to light.  Cardiovascular:     Rate and Rhythm: Normal rate and regular rhythm.     Heart sounds: No murmur heard. Pulmonary:     Effort: Pulmonary effort is normal. No respiratory distress.     Breath sounds: Normal breath sounds. No wheezing, rhonchi or rales.  Chest:     Chest wall: No tenderness.  Abdominal:     General: Abdomen is flat.     Palpations: Abdomen is soft.     Tenderness: There is no abdominal tenderness. There is no right CVA tenderness, left CVA tenderness, guarding or rebound.  Musculoskeletal:        General: No swelling or tenderness.     Cervical back: Neck supple. No rigidity or tenderness.  Skin:    General: Skin is warm and dry.     Capillary Refill: Capillary refill takes less than 2 seconds.     Findings: No erythema or rash.  Neurological:     General: No focal deficit present.     Mental Status: He is alert.     Sensory: No sensory deficit.     Motor: No weakness.  Psychiatric:        Mood and Affect: Mood normal.    ED Results / Procedures / Treatments   Labs (all labs ordered are listed, but only abnormal results are displayed) Labs Reviewed - No data to display  EKG None  Radiology No results found.  Procedures Procedures  {Document cardiac monitor, telemetry assessment procedure when appropriate:1}  Medications Ordered in ED Medications - No data to display  ED Course/ Medical Decision  Making/ A&P                           Medical Decision Making Amount and/or Complexity of Data Reviewed Labs: ordered.  Risk Prescription drug management.    Daniel Foley is a 51 y.o. male with a past medical history significant for seizures, schizophrenia, hyperlipidemia, CKD, and diabetes who presents for suspected seizure.  According to patient, he has not been sleeping and had run out of his seizure medicines for the last 4 days.  He thinks he may have been slightly dehydrated and reports that he picked up a blunt off the ground and smoked it thinking it was marijuana.  He then does not member what happened but  was told he had a seizure.  He had a small amount of bleeding from the left side of his tongue but otherwise has no complaints.  He physically has no fevers, chills, headache, neck pain, chest pain, abdominal pain, back pain, congestion, cough, shortness of breath, nausea, vomiting, constipation, diarrhea, or urinary changes.  He feels he is at his baseline now.  On exam, lungs clear and chest nontender.  Abdomen nontender.  Patient has a small punctate laceration on his left tongue that is hemostatic and is not gaping.  It does not go through and through.  Normal range of motion of tongue.  No other trauma seen.  Pupils symmetric and reactive normal extraocular movements.  Normal sensation and strength in extremities.  Normal finger-nose-finger testing.  Clear speech.  No other evidence of trauma.  No tenderness on exam.  Clinically I suspect the combination of dehydration, missing seizure meds, lack of sleep, and drug use likely cause this breakthrough seizure today.  Patient otherwise is feeling normal at this time.  We will give him some fluids as his mouth does look slightly dry and we will give him IV Keppra and give him his oral Depakote.  We will check some basic labs make sure there are no significant electrolyte abnormalities or evidence of other abnormalities.  Given his  lack of any headache or head injury will hold on CT head and patient agrees.  We will hold on imaging.  If work-up is reassuring and he remains seizure-free, anticipate he will likely be stable for discharge home tonight and he agrees.  Patient had stability with no further seizures here.  He felt better after fluids and seizure medicines.  His labs were overall reassuring with no evidence of UTI, CBC, and CMP reassuring.  UDS negative.  Unclear what the blunt was that he smoked but patient thinks it is due to missing his seizure medicines.  We will prescribe his Keppra and Depakote and he will follow-up with outpatient PCP and neurology.  Patient understand return precautions and follow-up instructions and was discharged in good condition feeling much better.    {Document critical care time when appropriate:1} {Document review of labs and clinical decision tools ie heart score, Chads2Vasc2 etc:1}  {Document your independent review of radiology images, and any outside records:1} {Document your discussion with family members, caretakers, and with consultants:1} {Document social determinants of health affecting pt's care:1} {Document your decision making why or why not admission, treatments were needed:1} Final Clinical Impression(s) / ED Diagnoses Final diagnoses:  None    Rx / DC Orders ED Discharge Orders     None

## 2021-06-21 NOTE — ED Notes (Signed)
Patient able to tolerate PO at this time

## 2021-06-21 NOTE — ED Notes (Signed)
Patient reminded of the need for a urine sample urinal at bedside patient acknowledges

## 2021-06-21 NOTE — ED Notes (Signed)
Pt is stating that he is ready to go home. Previously spoke with ED provider reference same advised he would be in shortly to speak with the pt

## 2021-06-21 NOTE — ED Notes (Signed)
Patient reminded of the need for a urine sample 

## 2021-06-21 NOTE — ED Triage Notes (Signed)
Per ems patient is coming from a [facility] group [facility]. Patient had an unwitnessed seizure.EMS reports that he was unresponsive when they arrived. Patient reports that he has been out of his meds for four 4 days. Patient a&o x4 at baseline.

## 2021-06-21 NOTE — ED Notes (Signed)
Provider at bedside

## 2021-06-21 NOTE — ED Notes (Signed)
Noted pt monitor at desk heart rate shoot up to in the 240, resp rate in the 40, and decrease o2 sats in the 70. Entered the pt room and he is A&O 4. Noted tremors in his head and lt side. Pt denies being cold. Denies having a seizure. ED provider made aware of same

## 2021-06-21 NOTE — ED Notes (Signed)
Pt provided cola, [place] sandwich, crackers

## 2021-06-21 NOTE — ED Notes (Signed)
Received verbal report from Lindsey B RN at this time 

## 2021-06-21 NOTE — ED Triage Notes (Signed)
Patient was incontinent and has a history of seizures and schizophrenia

## 2021-06-21 NOTE — Discharge Instructions (Addendum)
Your history, exam, work-up today are consistent with a seizure likely due to missing your medications for several days, not sleeping well, mild dehydration and what ever you smoked earlier today.  Your work-up was overall reassuring and we loaded you with your home medications.  Please fill the prescriptions for the seizure medicines and follow-up with your primary doctor.  If any symptoms change or worsen, please return to the nearest emergency department.

## 2023-11-12 IMAGING — CT CT ANGIO HEAD-NECK (W OR W/O PERF)
1 of 11 series · 5 of 33 positions shown · IV contrast (OMNI 350)
Comparison: CT head dated 1 day prior, MR head 06/21/2020

CLINICAL DATA: Altered mental status starting yesterday

EXAM:
CT ANGIOGRAPHY HEAD AND NECK
TECHNIQUE: Multidetector CT imaging of the head and neck was performed using
the standard protocol during bolus administration of intravenous
contrast. Multiplanar CT image reconstructions and MIPs were
obtained to evaluate the vascular anatomy. Carotid stenosis
measurements (when applicable) are obtained utilizing NASCET
criteria, using the distal internal carotid diameter as the
denominator.

[Series 11: cta neck axial · axial · 0.39mm/px · z∈[-225,+12]mm · 5 of 354 slices shown]
[im 59/354  soft-tissue]
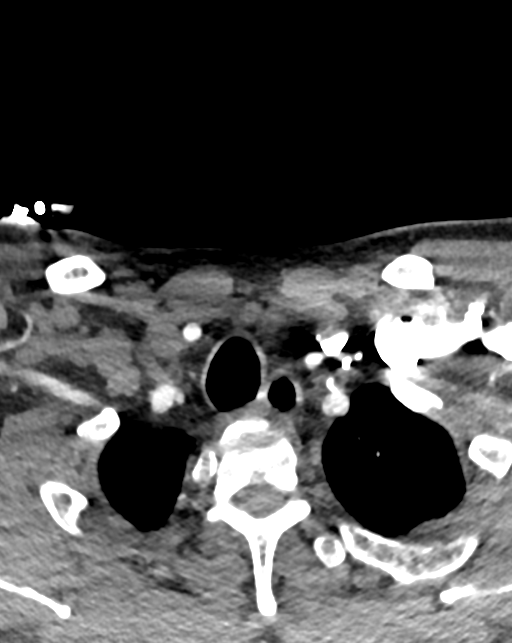
[im 118/354  bone]
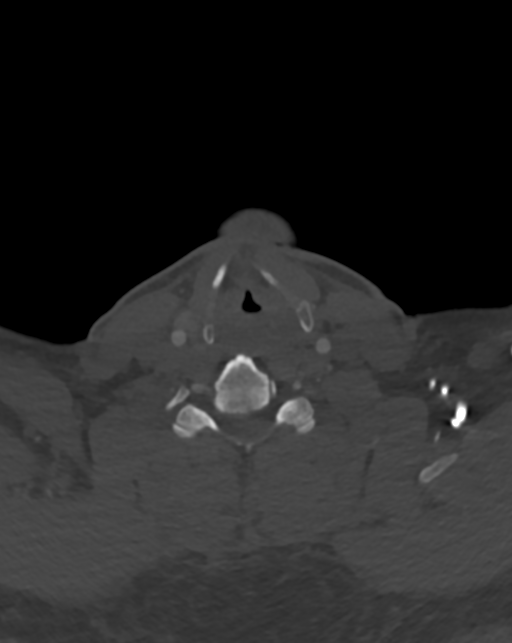
[im 177/354  soft-tissue]
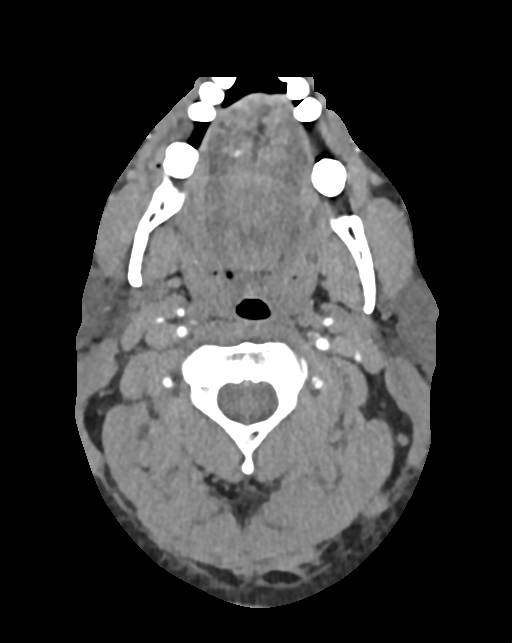
[im 236/354  bone]
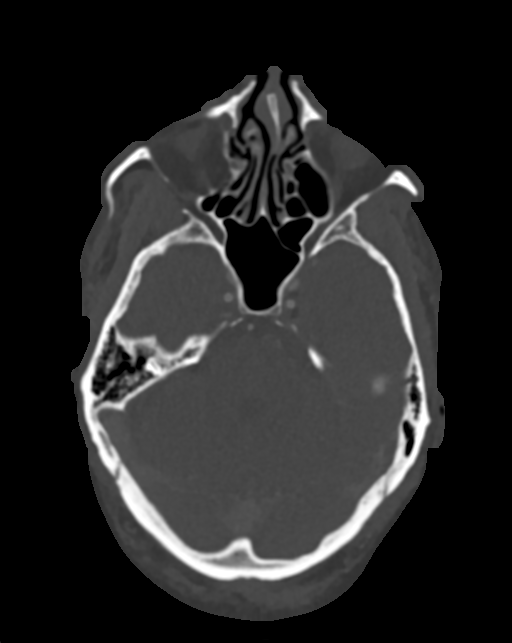
[im 295/354  soft-tissue]
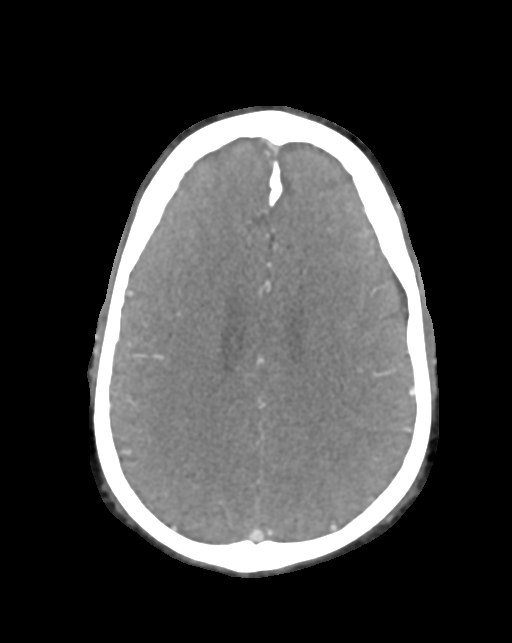

[5 of 33 positions shown; findings below may reference images not displayed]

RADIATION DOSE REDUCTION: This exam was performed according to the
departmental dose-optimization program which includes automated
exposure control, adjustment of the mA and/or kV according to
patient size and/or use of iterative reconstruction technique.

CONTRAST:  75mL OMNIPAQUE IOHEXOL 350 MG/ML SOLN
FINDINGS: CT HEAD FINDINGS

Brain: There is no evidence of acute intracranial hemorrhage,
extra-axial fluid collection, or acute infarct.

Parenchymal volume is normal. The ventricles are normal in size.
Gray-white differentiation is preserved.

There is no mass lesion.  There is no mass effect or midline shift.

Vascular: See below.

Skull: Normal. Negative for fracture or focal lesion. Remote
fractures of the bilateral zygomatic arches and nasal bones are
unchanged.

Sinuses: Paranasal sinuses are clear.

Orbits: The globes and orbits are unremarkable.

Other: A small left parietal scalp lipoma is unchanged.

Review of the MIP images confirms the above findings

CTA NECK FINDINGS

Aortic arch: Aortic arch is normal. The origins of the major branch
vessels are patent. The subclavian arteries are patent to the level
imaged.

Right carotid system: Right common, internal, and external carotid
arteries are patent, without hemodynamically significant stenosis or
occlusion. There is no dissection or aneurysm.

Left carotid system: The left common, internal, and external carotid
arteries are patent, without hemodynamically significant stenosis or
occlusion. There is no dissection or aneurysm.

Vertebral arteries: The vertebral arteries are patent, without
hemodynamically significant stenosis or occlusion. There is no
dissection or aneurysm.

Skeleton: There is degenerative change of the cervical spine most
advanced at C4-C5. There is no acute osseous abnormality or
aggressive osseous lesion. There is no visible canal hematoma.

Other neck: Are periapical lucencies and carious lesions involving
multiple of the remaining maxillary teeth.

Upper chest: There is fluid in the mildly patulous upper esophagus.
The imaged lung apices are clear.

Review of the MIP images confirms the above findings

CTA HEAD FINDINGS

Anterior circulation: The intracranial ICAs are patent

The bilateral MCAs are patent.

The bilateral ACAs are patent. Anterior communicating artery is
normal.

There is no aneurysm or AVM.

Posterior circulation: The bilateral V4 segments are patent. The
PICA origin is identified on the right but not well seen on the
left. The basilar artery is patent.

The bilateral PCAs are patent. Bilateral posterior communicating
arteries are identified.

There is no aneurysm or AVM.

Venous sinuses: As permitted by contrast timing, patent.

Anatomic variants: None.

Review of the MIP images confirms the above findings
IMPRESSION: 1. No acute intracranial pathology.
2. Patent vasculature of the head and neck.
3. Fluid in the patulous upper esophagus. Correlate with any history
of reflux.
4. Dental disease involving multiple of the remaining maxillary
teeth.
